# Patient Record
Sex: Male | Born: 1990 | Race: White | Hispanic: No | Smoking: Current every day smoker
Health system: Southern US, Community
[De-identification: ages and names within clinical notes are randomized; demographics above are authoritative.]

## PROBLEM LIST (undated history)

## (undated) ENCOUNTER — Emergency Department: Admission: EM | Discharge status: Absent without leave | Payer: Self-pay

## (undated) DIAGNOSIS — N289 Disorder of kidney and ureter, unspecified: Secondary | ICD-10-CM

## (undated) DIAGNOSIS — F329 Major depressive disorder, single episode, unspecified: Secondary | ICD-10-CM

## (undated) DIAGNOSIS — F32A Depression, unspecified: Secondary | ICD-10-CM

## (undated) DIAGNOSIS — F419 Anxiety disorder, unspecified: Secondary | ICD-10-CM

## (undated) DIAGNOSIS — F319 Bipolar disorder, unspecified: Secondary | ICD-10-CM

## (undated) DIAGNOSIS — K5090 Crohn's disease, unspecified, without complications: Secondary | ICD-10-CM

## (undated) HISTORY — PX: CHOLECYSTECTOMY: SHX55

---
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
## 2009-01-12 ENCOUNTER — Emergency Department: Payer: Self-pay | Admitting: Emergency Medicine

## 2009-04-11 ENCOUNTER — Emergency Department: Payer: Self-pay | Admitting: Emergency Medicine

## 2009-10-31 ENCOUNTER — Emergency Department: Payer: Self-pay | Admitting: Emergency Medicine

## 2010-02-23 ENCOUNTER — Emergency Department: Payer: Self-pay | Admitting: Emergency Medicine

## 2010-04-09 ENCOUNTER — Emergency Department: Payer: Self-pay | Admitting: Emergency Medicine

## 2012-08-17 ENCOUNTER — Emergency Department: Payer: Self-pay | Admitting: Internal Medicine

## 2013-10-14 ENCOUNTER — Emergency Department: Payer: Self-pay | Admitting: Emergency Medicine

## 2013-10-14 LAB — CBC
HCT: 52.8 % — ABNORMAL HIGH (ref 40.0–52.0)
HGB: 18.5 g/dL — AB (ref 13.0–18.0)
MCH: 32.5 pg (ref 26.0–34.0)
MCHC: 35.1 g/dL (ref 32.0–36.0)
MCV: 93 fL (ref 80–100)
Platelet: 214 10*3/uL (ref 150–440)
RBC: 5.7 10*6/uL (ref 4.40–5.90)
RDW: 13.1 % (ref 11.5–14.5)
WBC: 9 10*3/uL (ref 3.8–10.6)

## 2013-10-14 LAB — PROTIME-INR
INR: 0.9
Prothrombin Time: 12 secs (ref 11.5–14.7)

## 2013-10-14 LAB — COMPREHENSIVE METABOLIC PANEL
ALK PHOS: 116 U/L
AST: 16 U/L (ref 15–37)
Albumin: 4.5 g/dL (ref 3.4–5.0)
Anion Gap: 2 — ABNORMAL LOW (ref 7–16)
BUN: 11 mg/dL (ref 7–18)
Bilirubin,Total: 0.4 mg/dL (ref 0.2–1.0)
CHLORIDE: 105 mmol/L (ref 98–107)
CREATININE: 1.03 mg/dL (ref 0.60–1.30)
Calcium, Total: 9.7 mg/dL (ref 8.5–10.1)
Co2: 31 mmol/L (ref 21–32)
EGFR (African American): >60
Glucose: 72 mg/dL (ref 65–99)
Osmolality: 274 (ref 275–301)
Potassium: 3.9 mmol/L (ref 3.5–5.1)
SGPT (ALT): 34 U/L (ref 12–78)
Sodium: 138 mmol/L (ref 136–145)
Total Protein: 8.4 g/dL — ABNORMAL HIGH (ref 6.4–8.2)

## 2013-10-14 LAB — URINALYSIS, COMPLETE
BLOOD: NEGATIVE
Bilirubin,UR: NEGATIVE
GLUCOSE, UR: NEGATIVE mg/dL (ref 0–75)
KETONE: NEGATIVE
Leukocyte Esterase: NEGATIVE
NITRITE: NEGATIVE
PH: 8 (ref 4.5–8.0)
PROTEIN: NEGATIVE
SPECIFIC GRAVITY: 1.019 (ref 1.003–1.030)
SQUAMOUS EPITHELIAL: NONE SEEN
WBC UR: NONE SEEN /HPF (ref 0–5)

## 2013-10-14 LAB — LIPASE, BLOOD: LIPASE: 70 U/L — AB (ref 73–393)

## 2013-11-09 ENCOUNTER — Emergency Department: Payer: Self-pay | Admitting: Emergency Medicine

## 2013-11-09 LAB — COMPREHENSIVE METABOLIC PANEL
ALK PHOS: 103 U/L
ANION GAP: 6 — AB (ref 7–16)
Albumin: 3.9 g/dL (ref 3.4–5.0)
BUN: 13 mg/dL (ref 7–18)
Bilirubin,Total: 0.5 mg/dL (ref 0.2–1.0)
CALCIUM: 9 mg/dL (ref 8.5–10.1)
CHLORIDE: 105 mmol/L (ref 98–107)
Co2: 29 mmol/L (ref 21–32)
Creatinine: 1 mg/dL (ref 0.60–1.30)
EGFR (African American): >60
Glucose: 73 mg/dL (ref 65–99)
Osmolality: 278 (ref 275–301)
POTASSIUM: 3.7 mmol/L (ref 3.5–5.1)
SGOT(AST): 19 U/L (ref 15–37)
SGPT (ALT): 42 U/L (ref 12–78)
Sodium: 140 mmol/L (ref 136–145)
TOTAL PROTEIN: 7.4 g/dL (ref 6.4–8.2)

## 2013-11-09 LAB — CBC WITH DIFFERENTIAL/PLATELET
Basophil #: 0 10*3/uL (ref 0.0–0.1)
Basophil %: 0.5 %
EOS PCT: 0.9 %
Eosinophil #: 0.1 10*3/uL (ref 0.0–0.7)
HCT: 50.2 % (ref 40.0–52.0)
HGB: 16.9 g/dL (ref 13.0–18.0)
Lymphocyte #: 2.9 10*3/uL (ref 1.0–3.6)
Lymphocyte %: 30.2 %
MCH: 31.1 pg (ref 26.0–34.0)
MCHC: 33.7 g/dL (ref 32.0–36.0)
MCV: 93 fL (ref 80–100)
MONO ABS: 0.8 x10 3/mm (ref 0.2–1.0)
MONOS PCT: 8.1 %
NEUTROS ABS: 5.8 10*3/uL (ref 1.4–6.5)
Neutrophil %: 60.3 %
Platelet: 200 10*3/uL (ref 150–440)
RBC: 5.43 10*6/uL (ref 4.40–5.90)
RDW: 13.5 % (ref 11.5–14.5)
WBC: 9.7 10*3/uL (ref 3.8–10.6)

## 2013-12-07 ENCOUNTER — Ambulatory Visit: Payer: Self-pay | Admitting: Gastroenterology

## 2013-12-09 LAB — PATHOLOGY REPORT

## 2014-11-22 ENCOUNTER — Emergency Department
Admit: 2014-11-22 | Disposition: A | Discharge status: Home / Self Care | Payer: Self-pay | Admitting: Emergency Medicine

## 2016-04-03 ENCOUNTER — Encounter: Payer: Self-pay | Admitting: Emergency Medicine

## 2016-04-03 ENCOUNTER — Observation Stay
Admission: EM | Admit: 2016-04-03 | Discharge: 2016-04-06 | Disposition: A | Discharge status: Home / Self Care | Payer: Self-pay | Attending: Internal Medicine | Admitting: Internal Medicine

## 2016-04-03 DIAGNOSIS — N390 Urinary tract infection, site not specified: Secondary | ICD-10-CM | POA: Insufficient documentation

## 2016-04-03 DIAGNOSIS — E860 Dehydration: Secondary | ICD-10-CM | POA: Insufficient documentation

## 2016-04-03 DIAGNOSIS — F17210 Nicotine dependence, cigarettes, uncomplicated: Secondary | ICD-10-CM | POA: Insufficient documentation

## 2016-04-03 DIAGNOSIS — F17200 Nicotine dependence, unspecified, uncomplicated: Secondary | ICD-10-CM

## 2016-04-03 DIAGNOSIS — I10 Essential (primary) hypertension: Secondary | ICD-10-CM | POA: Insufficient documentation

## 2016-04-03 DIAGNOSIS — D72829 Elevated white blood cell count, unspecified: Secondary | ICD-10-CM | POA: Insufficient documentation

## 2016-04-03 DIAGNOSIS — N3289 Other specified disorders of bladder: Secondary | ICD-10-CM | POA: Insufficient documentation

## 2016-04-03 DIAGNOSIS — R319 Hematuria, unspecified: Secondary | ICD-10-CM | POA: Insufficient documentation

## 2016-04-03 DIAGNOSIS — E8770 Fluid overload, unspecified: Secondary | ICD-10-CM | POA: Insufficient documentation

## 2016-04-03 DIAGNOSIS — Q7649 Other congenital malformations of spine, not associated with scoliosis: Secondary | ICD-10-CM | POA: Insufficient documentation

## 2016-04-03 DIAGNOSIS — N179 Acute kidney failure, unspecified: Principal | ICD-10-CM | POA: Insufficient documentation

## 2016-04-03 DIAGNOSIS — E876 Hypokalemia: Secondary | ICD-10-CM | POA: Insufficient documentation

## 2016-04-03 DIAGNOSIS — K5090 Crohn's disease, unspecified, without complications: Secondary | ICD-10-CM | POA: Insufficient documentation

## 2016-04-03 HISTORY — DX: Crohn's disease, unspecified, without complications: K50.90

## 2016-04-03 LAB — BASIC METABOLIC PANEL
Anion gap: 15 (ref 5–15)
BUN: 21 mg/dL — ABNORMAL HIGH (ref 6–20)
CO2: 24 mmol/L (ref 22–32)
Calcium: 12.3 mg/dL — ABNORMAL HIGH (ref 8.9–10.3)
Chloride: 97 mmol/L — ABNORMAL LOW (ref 101–111)
Creatinine, Ser: 2.79 mg/dL — ABNORMAL HIGH (ref 0.61–1.24)
GFR calc non Af Amer: 30 mL/min — ABNORMAL LOW (ref >60)
GFR, EST AFRICAN AMERICAN: 34 mL/min — AB (ref >60)
Glucose, Bld: 100 mg/dL — ABNORMAL HIGH (ref 65–99)
POTASSIUM: 4 mmol/L (ref 3.5–5.1)
SODIUM: 136 mmol/L (ref 135–145)

## 2016-04-03 LAB — HEPATIC FUNCTION PANEL
ALBUMIN: 5.8 g/dL — AB (ref 3.5–5.0)
ALK PHOS: 105 U/L (ref 38–126)
ALT: 32 U/L (ref 17–63)
AST: 31 U/L (ref 15–41)
Bilirubin, Direct: <0.1 mg/dL — ABNORMAL LOW (ref 0.1–0.5)
Total Bilirubin: 0.9 mg/dL (ref 0.3–1.2)
Total Protein: 9.9 g/dL — ABNORMAL HIGH (ref 6.5–8.1)

## 2016-04-03 LAB — CBC
HEMATOCRIT: 53.1 % — AB (ref 40.0–52.0)
Hemoglobin: 18.6 g/dL — ABNORMAL HIGH (ref 13.0–18.0)
MCH: 31.7 pg (ref 26.0–34.0)
MCHC: 35 g/dL (ref 32.0–36.0)
MCV: 90.4 fL (ref 80.0–100.0)
Platelets: 312 10*3/uL (ref 150–440)
RBC: 5.87 MIL/uL (ref 4.40–5.90)
RDW: 13.3 % (ref 11.5–14.5)
WBC: 23.7 10*3/uL — AB (ref 3.8–10.6)

## 2016-04-03 LAB — CK: Total CK: 184 U/L (ref 49–397)

## 2016-04-03 MED ORDER — SODIUM CHLORIDE 0.9 % IV BOLUS (SEPSIS)
1000.0000 mL | Freq: Once | INTRAVENOUS | Status: AC
  Administered 2016-04-03: 1000 mL via INTRAVENOUS

## 2016-04-03 MED ORDER — NICOTINE POLACRILEX 2 MG MT GUM: 2.0000 mg | CHEWING_GUM | OROMUCOSAL | Status: DC | PRN

## 2016-04-03 MED ORDER — ACETAMINOPHEN 500 MG PO TABS
1000.0000 mg | ORAL_TABLET | Freq: Once | ORAL | Status: AC
  Administered 2016-04-03: 1000 mg via ORAL
  Filled 2016-04-03: qty 2

## 2016-04-03 MED ORDER — NICOTINE 21 MG/24HR TD PT24
21.0000 mg | MEDICATED_PATCH | Freq: Every day | TRANSDERMAL | Status: DC
  Administered 2016-04-04 – 2016-04-06 (×3): 21 mg via TRANSDERMAL
  Filled 2016-04-03 (×3): qty 1

## 2016-04-03 MED ORDER — SODIUM CHLORIDE 0.9 % IV BOLUS (SEPSIS)
1000.0000 mL | Freq: Once | INTRAVENOUS | Status: AC
Start: 2016-04-03 — End: 2016-04-04
  Administered 2016-04-03: 1000 mL via INTRAVENOUS

## 2016-04-03 NOTE — ED Provider Notes (Signed)
[facility] Emergency Department Provider Note   ____________________________________________   I have reviewed the triage vital signs and the nursing notes.   HISTORY  Chief Complaint Loss of Consciousness; Spasms; and Altered Mental Status   History limited by: Not Limited   HPI Carl Martinez is a 25 y.o. male who presents to the emergency department today after syncopal episode. 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 He then fell. He did not fall. Coworkers helped him off and then when he was down on the ground. He denies any history. He denied any chest pain during this episode. He has had some associated muscle cramps and spasms. The patient states that he thought he was staying well-hydrated today. He however cannot recall urinating except after waking up.   Past Medical History:  Diagnosis Date  . Crohn disease ([place])     There are no active problems to display for this patient.   Past Surgical History:  Procedure Laterality Date  . CHOLECYSTECTOMY      Prior to Admission medications   Not on File    Allergies Review of patient's allergies indicates no known allergies.  No family history on file.  Social History Social History  Substance Use Topics  . Smoking status: Current Every Day Smoker    Packs/day: 1.00    Types: Cigarettes  . Smokeless tobacco: Never Used  . Alcohol use No    Review of Systems  Constitutional: Negative for fever. Cardiovascular: Negative for chest pain. Respiratory: Negative for shortness of breath. Gastrointestinal: Negative for abdominal pain, vomiting and diarrhea. Genitourinary: Negative for dysuria. Decreased urinary frequency. Musculoskeletal: Positive for body cramps and aches Skin: Negative for rash. Neurological: Negative for headaches, focal weakness or numbness.  10-point ROS otherwise  negative.  ____________________________________________   PHYSICAL EXAM:  VITAL SIGNS: ED Triage Vitals  Enc Vitals Group     BP 04/03/16 1939 113/74     Pulse Rate 04/03/16 1939 (!) 104     Resp 04/03/16 1939 20     Temp 04/03/16 1939 98.2 F (36.8 C)     Temp Source 04/03/16 1939 Oral     SpO2 04/03/16 1939 98 %     Weight 04/03/16 1939 240 lb (108.9 kg)     Height 04/03/16 1939 6' 1"  (1.854 m)     Head Circumference --      Peak Flow --      Pain Score 04/03/16 1940 10   Constitutional: Alert and oriented. Appears slightly uncomfortable.  Eyes: Conjunctivae are normal. Normal extraocular movements. ENT   Head: Normocephalic and atraumatic.   Nose: No congestion/rhinnorhea.   Mouth/Throat: Mucous membranes are moist.   Neck: No stridor. Hematological/Lymphatic/Immunilogical: No cervical lymphadenopathy. Cardiovascular: Tachycardic. regular rhythm.  No murmurs, rubs, or gallops. Respiratory: Normal respiratory effort without tachypnea nor retractions. Breath sounds are clear and equal bilaterally. No wheezes/rales/rhonchi. Gastrointestinal: Soft and nontender. No distention.  Genitourinary: Deferred Musculoskeletal: Normal range of motion in all extremities. No lower extremity edema. Neurologic:  Normal speech and language. No gross focal neurologic deficits are appreciated.  Skin:  Skin is warm, dry and intact. Question sunburn on face. Psychiatric: Mood and affect are normal. Speech and behavior are normal. Patient exhibits appropriate insight and judgment.  ____________________________________________    LABS (pertinent positives/negatives)  Labs Reviewed  BASIC METABOLIC PANEL - Abnormal; Notable for the following:       Result Value  Chloride 97 (*)    Glucose, Bld 100 (*)    BUN 21 (*)    Creatinine, Ser 2.79 (*)    Calcium 12.3 (*)    GFR calc non Af Amer 30 (*)    GFR calc Af Amer 34 (*)    All other components within normal limits  CBC -  Abnormal; Notable for the following:    WBC 23.7 (*)    Hemoglobin 18.6 (*)    HCT 53.1 (*)    All other components within normal limits  HEPATIC FUNCTION PANEL - Abnormal; Notable for the following:    Total Protein 9.9 (*)    Albumin 5.8 (*)    Bilirubin, Direct <0.1 (*)    All other components within normal limits  CK  URINALYSIS COMPLETEWITH MICROSCOPIC ([facility] ONLY)  LACTIC ACID, PLASMA  LACTIC ACID, PLASMA  C-REACTIVE PROTEIN  CBG MONITORING, ED    ____________________________________________   EKG  I, Nance Pear, attending physician, personally viewed and interpreted this EKG  EKG Time: 1944 Rate: 102 Rhythm: sinus tachycardia Axis: normal Intervals: qtc 424 QRS: narrow ST changes: no st elevation Impression: abnormal ekg   ____________________________________________    RADIOLOGY  None  ___________________________________________   PROCEDURES  Procedures  ____________________________________________   INITIAL IMPRESSION / ASSESSMENT AND PLAN / ED COURSE  Pertinent labs & imaging results that were available during my care of the patient were reviewed by me and considered in my medical decision making (see chart for details).  Patient presented to the emergency department today after 2 syncopal episodes while at work. Patient does work as a Theme park manager. Blood work is concerning for significant dehydration with elevation of his hemoglobin, white blood cells, calcium and creatinine. Will give patient IV fluids here. Will plan on admission to the hospital service. ____________________________________________   FINAL CLINICAL IMPRESSION(S) / ED DIAGNOSES  Final diagnoses:  AKI (acute kidney injury) ([place])  Dehydration  Hypercalcemia     Note: This dictation was prepared with Dragon dictation. Any transcriptional errors that result from this process are unintentional    Nance Pear, MD 04/03/16 2348

## 2016-04-03 NOTE — ED Notes (Signed)
Charge nurse notified of pt's labs

## 2016-04-03 NOTE — ED Triage Notes (Signed)
Pt presents to ED post syncope X2 and severe muscle cramping this afternoon. 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 Pt states he did not fall off roof. Pt was assisted to the ground by co-workers and sat down on the ground just prior to his second syncopal episode. unsure if he hit his head on the concrete. Continued dizziness and generalized muscle cramping. Pt states he "feels slow" and "foggy". Pt states when he has a muscle spasm he starts to sweat and feels like he is going to pass out again. Face red and frequent muscle spasms noted during triage. Restless. Able to answer questions.

## 2016-04-04 ENCOUNTER — Encounter: Payer: Self-pay | Admitting: *Deleted

## 2016-04-04 ENCOUNTER — Observation Stay: Payer: Self-pay

## 2016-04-04 LAB — URINALYSIS COMPLETE WITH MICROSCOPIC (ARMC ONLY)
BILIRUBIN URINE: NEGATIVE
GLUCOSE, UA: NEGATIVE mg/dL
HGB URINE DIPSTICK: NEGATIVE
LEUKOCYTES UA: NEGATIVE
Nitrite: NEGATIVE
Protein, ur: 30 mg/dL — AB
SPECIFIC GRAVITY, URINE: 1.027 (ref 1.005–1.030)
pH: 5 (ref 5.0–8.0)

## 2016-04-04 LAB — CBC
HEMATOCRIT: 43.6 % (ref 40.0–52.0)
Hemoglobin: 15.5 g/dL (ref 13.0–18.0)
MCH: 32.2 pg (ref 26.0–34.0)
MCHC: 35.5 g/dL (ref 32.0–36.0)
MCV: 90.6 fL (ref 80.0–100.0)
Platelets: 229 10*3/uL (ref 150–440)
RBC: 4.82 MIL/uL (ref 4.40–5.90)
RDW: 13.5 % (ref 11.5–14.5)
WBC: 15 10*3/uL — ABNORMAL HIGH (ref 3.8–10.6)

## 2016-04-04 LAB — COMPREHENSIVE METABOLIC PANEL
ALBUMIN: 4 g/dL (ref 3.5–5.0)
ALT: 21 U/L (ref 17–63)
ANION GAP: 7 (ref 5–15)
AST: 18 U/L (ref 15–41)
Alkaline Phosphatase: 78 U/L (ref 38–126)
BILIRUBIN TOTAL: 0.8 mg/dL (ref 0.3–1.2)
BUN: 20 mg/dL (ref 6–20)
CO2: 24 mmol/L (ref 22–32)
Calcium: 8.9 mg/dL (ref 8.9–10.3)
Chloride: 104 mmol/L (ref 101–111)
Creatinine, Ser: 1.7 mg/dL — ABNORMAL HIGH (ref 0.61–1.24)
GFR calc Af Amer: >60 mL/min (ref >60)
GFR calc non Af Amer: 54 mL/min — ABNORMAL LOW (ref >60)
GLUCOSE: 97 mg/dL (ref 65–99)
POTASSIUM: 3.4 mmol/L — AB (ref 3.5–5.1)
SODIUM: 135 mmol/L (ref 135–145)
TOTAL PROTEIN: 7.1 g/dL (ref 6.5–8.1)

## 2016-04-04 LAB — LACTIC ACID, PLASMA
LACTIC ACID, VENOUS: 1.1 mmol/L (ref 0.5–1.9)
Lactic Acid, Venous: 1.2 mmol/L (ref 0.5–1.9)

## 2016-04-04 LAB — MAGNESIUM: Magnesium: 2 mg/dL (ref 1.7–2.4)

## 2016-04-04 LAB — C-REACTIVE PROTEIN: CRP: 1.4 mg/dL — AB (ref <1.0)

## 2016-04-04 LAB — PHOSPHORUS: Phosphorus: 3.8 mg/dL (ref 2.5–4.6)

## 2016-04-04 MED ORDER — POTASSIUM CHLORIDE CRYS ER 20 MEQ PO TBCR
40.0000 meq | EXTENDED_RELEASE_TABLET | Freq: Once | ORAL | Status: AC
  Administered 2016-04-04: 40 meq via ORAL
  Filled 2016-04-04: qty 2

## 2016-04-04 MED ORDER — MAGNESIUM CITRATE PO SOLN
1.0000 | Freq: Once | ORAL | Status: DC | PRN
  Filled 2016-04-04: qty 296

## 2016-04-04 MED ORDER — DEXTROSE 5 % IV SOLN
1.0000 g | INTRAVENOUS | Status: DC
  Administered 2016-04-04 – 2016-04-05 (×2): 1 g via INTRAVENOUS
  Filled 2016-04-04 (×2): qty 10

## 2016-04-04 MED ORDER — ONDANSETRON HCL 4 MG PO TABS: 4.0000 mg | ORAL_TABLET | Freq: Four times a day (QID) | ORAL | Status: DC | PRN

## 2016-04-04 MED ORDER — SENNOSIDES-DOCUSATE SODIUM 8.6-50 MG PO TABS: 1.0000 | ORAL_TABLET | Freq: Every evening | ORAL | Status: DC | PRN

## 2016-04-04 MED ORDER — HYDROCODONE-ACETAMINOPHEN 5-325 MG PO TABS
1.0000 | ORAL_TABLET | ORAL | Status: DC | PRN
  Administered 2016-04-04 – 2016-04-05 (×8): 2 via ORAL
  Filled 2016-04-04 (×8): qty 2

## 2016-04-04 MED ORDER — ACETAMINOPHEN 650 MG RE SUPP: 650.0000 mg | Freq: Four times a day (QID) | RECTAL | Status: DC | PRN

## 2016-04-04 MED ORDER — ZOLPIDEM TARTRATE 5 MG PO TABS: 5.0000 mg | ORAL_TABLET | Freq: Every evening | ORAL | Status: DC | PRN

## 2016-04-04 MED ORDER — NICOTINE 21 MG/24HR TD PT24
MEDICATED_PATCH | TRANSDERMAL | Status: AC
  Administered 2016-04-04: 21 mg via TRANSDERMAL
  Filled 2016-04-04: qty 1

## 2016-04-04 MED ORDER — TRAMADOL HCL 50 MG PO TABS: 50.0000 mg | ORAL_TABLET | Freq: Four times a day (QID) | ORAL | Status: DC | PRN

## 2016-04-04 MED ORDER — ACETAMINOPHEN 325 MG PO TABS: 650.0000 mg | ORAL_TABLET | Freq: Four times a day (QID) | ORAL | Status: DC | PRN

## 2016-04-04 MED ORDER — SODIUM CHLORIDE 0.9 % IV SOLN
INTRAVENOUS | Status: DC
  Administered 2016-04-04 (×5): via INTRAVENOUS

## 2016-04-04 MED ORDER — BISACODYL 5 MG PO TBEC: 5.0000 mg | DELAYED_RELEASE_TABLET | Freq: Every day | ORAL | Status: DC | PRN

## 2016-04-04 MED ORDER — SODIUM CHLORIDE 0.9% FLUSH
3.0000 mL | Freq: Two times a day (BID) | INTRAVENOUS | Status: DC
  Administered 2016-04-05 – 2016-04-06 (×3): 3 mL via INTRAVENOUS

## 2016-04-04 MED ORDER — ONDANSETRON HCL 4 MG/2ML IJ SOLN
4.0000 mg | Freq: Four times a day (QID) | INTRAMUSCULAR | Status: DC | PRN
Start: 2016-04-04 — End: 2016-04-06
  Administered 2016-04-04 – 2016-04-05 (×4): 4 mg via INTRAVENOUS
  Filled 2016-04-04 (×4): qty 2

## 2016-04-04 NOTE — H&P (Signed)
[place] @ [facility] Admission History and Physical [employer], D.O.  ---------------------------------------------------------------------------------------------------------------------   PATIENT NAME: Carl Martinez MR#: [id number] DATE OF BIRTH: 04-23-1991 DATE OF ADMISSION: 04/03/2016 PRIMARY CARE PHYSICIAN: No primary care provider on file.  REQUESTING/REFERRING PHYSICIAN: ED Dr. Archie Balboa  CHIEF COMPLAINT: Chief Complaint  Patient presents with  . Loss of Consciousness  . Spasms  . Altered Mental Status    HISTORY OF PRESENT ILLNESS: Carl Martinez is a 25 y.o. male with a known history of Crohn's disease was in a usual state of health until this afternoon when he was at work and sustained a syncopal episode. Patient states that his morning was unremarkable until about noon when he experienced dizziness, lightheadedness, muscle cramps and spasm. He lost consciousness and fell to the ground. He regained consciousness spontaneously and was transported to the emergency department. The only other notable symptoms he can relay is that he has not had to urinate since this morning.  He denies any other recent changes in his status. There has been no recent change in medication or diet.  There has been no recent illness, travel or sick contacts.    Patient denies fevers/chills, weakness, dizziness, chest pain, shortness of breath, N/V/C/D, abdominal pain, dysuria/frequency, changes in mental status.   EMS/ED COURSE:   Patient received IV normal saline  PAST MEDICAL HISTORY: Past Medical History:  Diagnosis Date  . Crohn disease ([place])       PAST SURGICAL HISTORY: Past Surgical History:  Procedure Laterality Date  . CHOLECYSTECTOMY        SOCIAL HISTORY: Social History  Substance Use Topics  . Smoking status: Current Every Day Smoker    Packs/day: 1.00    Types: Cigarettes  . Smokeless tobacco: Never Used  . Alcohol use No   Patient denies  illicit drug use   FAMILY HISTORY: History reviewed. No pertinent family history.   MEDICATIONS AT HOME: Prior to Admission medications   Not on File      DRUG ALLERGIES: No Known Allergies   REVIEW OF SYSTEMS: CONSTITUTIONAL: No fever/chills, fatigue, weakness, weight gain/loss, headache EYES: No blurry or double vision. ENT: No tinnitus, postnasal drip, redness or soreness of the oropharynx. RESPIRATORY: No cough, wheeze, hemoptysis, dyspnea. CARDIOVASCULAR: No chest pain, orthopnea, palpitations. GASTROINTESTINAL: No nausea, vomiting, constipation, diarrhea, abdominal pain, hematemesis, melena or hematochezia. GENITOURINARY: No dysuria or hematuria. Positive decreased urine output ENDOCRINE: No polyuria or nocturia. No heat or cold intolerance. HEMATOLOGY: No anemia, bruising, bleeding. INTEGUMENTARY: No rashes, ulcers, lesions. MUSCULOSKELETAL: No arthritis, swelling, gout. Positive muscle cramps NEUROLOGIC: No numbness, tingling, weakness or ataxia. No seizure-type activity. Positive syncope PSYCHIATRIC: No anxiety, depression, insomnia.  PHYSICAL EXAMINATION: VITAL SIGNS: Blood pressure (!) 141/80, pulse 97, temperature 97.8 F (36.6 C), temperature source Oral, resp. rate 18, height 6' 1"  (1.854 m), weight 110.7 kg (244 lb), SpO2 100 %.  GENERAL: 25 y.o.-year-old white male patient, well-developed, well-nourished lying in the bed in no acute distress.  Pleasant and cooperative. HEENT: Head atraumatic, normocephalic. Pupils equal, round, reactive to light and accommodation. No scleral icterus. Extraocular muscles intact. Nares are patent. Oropharynx is clear. Mucus membranes moist. NECK: Supple, full range of motion. No JVD, no bruit heard. No thyroid enlargement, no tenderness, no cervical lymphadenopathy. CHEST: Normal breath sounds bilaterally. No wheezing, rales, rhonchi or crackles. No use of accessory muscles of respiration.  No reproducible chest wall tenderness.   CARDIOVASCULAR: S1, S2 normal. No murmurs, rubs, or gallops. Cap refill <2 seconds. ABDOMEN:  Soft, nontender, nondistended. No rebound, guarding, rigidity. Normoactive bowel sounds present in all four quadrants. No organomegaly or mass. EXTREMITIES: Full range of motion. No pedal edema, cyanosis, or clubbing. NEUROLOGIC: Cranial nerves II through XII are grossly intact with no focal sensorimotor deficit. Muscle strength 5/5 in all extremities. Sensation intact. Gait not checked. PSYCHIATRIC: The patient is alert and oriented x 3. Normal affect, mood, thought content. SKIN: Warm, dry, and intact without obvious rash, lesion, or ulcer. Patient with sunburned skin.  LABORATORY PANEL:  CBC  Recent Labs Lab 04/03/16 1955  WBC 23.7*  HGB 18.6*  HCT 53.1*  PLT 312   ----------------------------------------------------------------------------------------------------------------- Chemistries  Recent Labs Lab 04/03/16 1955  NA 136  K 4.0  CL 97*  CO2 24  GLUCOSE 100*  BUN 21*  CREATININE 2.79*  CALCIUM 12.3*  AST 31  ALT 32  ALKPHOS 105  BILITOT 0.9   ------------------------------------------------------------------------------------------------------------------ Cardiac Enzymes No results for input(s): TROPONINI in the last 168 hours. ------------------------------------------------------------------------------------------------------------------  RADIOLOGY: No results found.  EKG: Sinus tachycardia at 102 bpm with normal axis and nonspecific ST and T wave changes.  IMPRESSION AND PLAN:  This is a 25 y.o. male with a history of Crohn's disease now being admitted with: 1. AKI possibly secondary to dehydration. Will admit for IV fluid resuscitation and monitoring of electrolytes. We'll also check CPK 2. Syncope likely secondary to dehydration. We will monitor the patient on telemetry 3. Leukocytosis-possibly secondary to hemoconcentration. Patient has no signs of  infection with the exception of some mild nasal congestion. We'll monitor CBC in the a.m. following IV fluid resuscitation 4. Hypercalcemia-we'll rehydrate and recheck calcium level in the morning.   Diet/Nutrition: Regular Fluids: IV normal saline DVT Px: SCDs and early ambulation Code Status: Full  All the records are reviewed and case discussed with ED provider. Management plans discussed with the patient and/or family who express understanding and agree with plan of care.   TOTAL TIME TAKING CARE OF THIS PATIENT:50 minutes.   Aevah Stansbery D.O. on 04/04/2016 at 1:27 AM Between 7am to 6pm - Pager - [phone] After 6pm go to [email or web address] - [employer] [place] [facility] Office [phone] CC: Primary care physician; No primary care provider on file.     Note: This dictation was prepared with Dragon dictation along with smaller phrase technology. Any transcriptional errors that result from this process are unintentional.

## 2016-04-04 NOTE — Progress Notes (Signed)
[place] at [place] NAME: Carl Martinez    MR#:  [id number]  DATE OF BIRTH:  [birth year]  SUBJECTIVE:  CHIEF COMPLAINT:   Chief Complaint  Patient presents with  . Loss of Consciousness  . Spasms  . Altered Mental Status   The patient is 25 year old Caucasian male with past medical history significant for history of Crohn's disease, who presents to the hospital with syncopal episode, lightheadedness, dizziness, muscle cramps and spasms. On arrival to the hospital. He was noted to be in acute renal failure. He complains of dizziness, lightheadedness, right flank pain, some dysuria. Denies any recent diarrhea, last bowel movement was on Monday, 2 days ago, diarrheal stool. Complains of difficulty urinating, inability to void. Review of Systems  Constitutional: Positive for malaise/fatigue. Negative for chills, fever and weight loss.  HENT: Negative for congestion.   Eyes: Negative for blurred vision and double vision.  Respiratory: Negative for cough, sputum production, shortness of breath and wheezing.   Cardiovascular: Negative for chest pain, palpitations, orthopnea, leg swelling and PND.  Gastrointestinal: Positive for abdominal pain and diarrhea. Negative for blood in stool, constipation, nausea and vomiting.  Genitourinary: Positive for flank pain. Negative for dysuria, frequency, hematuria and urgency.  Musculoskeletal: Negative for falls.  Neurological: Positive for weakness. Negative for dizziness, tremors, focal weakness and headaches.  Endo/Heme/Allergies: Does not bruise/bleed easily.  Psychiatric/Behavioral: Negative for depression. The patient does not have insomnia.     VITAL SIGNS: Blood pressure 125/65, pulse 81, temperature 97.5 F (36.4 C), temperature source Oral, resp. rate 15, height 6' 1"  (1.854 m), weight 110.7 kg (244 lb), SpO2 100 %.  PHYSICAL EXAMINATION:   GENERAL:  25 y.o.-year-old patient lying in the  bed flushed face, in mild distress, uncomfortable due to pain in the flank.  EYES: Pupils equal, round, reactive to light and accommodation. No scleral icterus. Extraocular muscles intact.  HEENT: Head atraumatic, normocephalic. Oropharynx and nasopharynx clear.  NECK:  Supple, no jugular venous distention. No thyroid enlargement, no tenderness.  LUNGS: Normal breath sounds bilaterally, no wheezing, rales,rhonchi or crepitation. No use of accessory muscles of respiration.  CARDIOVASCULAR: S1, S2 normal. No murmurs, rubs, or gallops.  ABDOMEN: Soft, tender in the right upper quadrant, right mid abdomen some suprapubic area, but no rebound or guarding, nondistended. Bowel sounds present. No organomegaly or mass. Right CVA tenderness on percussion EXTREMITIES: No pedal edema, cyanosis, or clubbing.  NEUROLOGIC: Cranial nerves II through XII are intact. Muscle strength 5/5 in all extremities. Sensation intact. Gait not checked.  PSYCHIATRIC: The patient is alert and oriented x 3.  SKIN: No obvious rash, lesion, or ulcer.   ORDERS/RESULTS REVIEWED:   CBC  Recent Labs Lab 04/03/16 1955 04/04/16 0244  WBC 23.7* 15.0*  HGB 18.6* 15.5  HCT 53.1* 43.6  PLT 312 229  MCV 90.4 90.6  MCH 31.7 32.2  MCHC 35.0 35.5  RDW 13.3 13.5   ------------------------------------------------------------------------------------------------------------------  Chemistries   Recent Labs Lab 04/03/16 1955 04/04/16 0244  NA 136 135  K 4.0 3.4*  CL 97* 104  CO2 24 24  GLUCOSE 100* 97  BUN 21* 20  CREATININE 2.79* 1.70*  CALCIUM 12.3* 8.9  MG  --  2.0  AST 31 18  ALT 32 21  ALKPHOS 105 78  BILITOT 0.9 0.8   ------------------------------------------------------------------------------------------------------------------ estimated creatinine clearance is 85.9 mL/min (by C-G formula based on SCr of 1.7  mg/dL). ------------------------------------------------------------------------------------------------------------------ No results for input(s): TSH, T4TOTAL, T3FREE, THYROIDAB  in the last 72 hours.  Invalid input(s): FREET3  Cardiac Enzymes No results for input(s): CKMB, TROPONINI, MYOGLOBIN in the last 168 hours.  Invalid input(s): CK ------------------------------------------------------------------------------------------------------------------ Invalid input(s): POCBNP ---------------------------------------------------------------------------------------------------------------  RADIOLOGY: No results found.  EKG:  Orders placed or performed during the hospital encounter of 04/03/16  . ED EKG  . ED EKG    ASSESSMENT AND PLAN:  Active Problems:   AKI (acute kidney injury) ([place])  #1. Acute renal failure, likely due to dehydration with diarrhea, infection/acute pyelonephritis, urinary retention, continue IV fluids, kidney function has been improving, get CT scan of abdomen and pelvis to rule out stones #2. Acute pyelonephritis, continue Rocephin after cultures were taken, follow culture results, supportive therapy as needed #3hypokalemia, supplement orally, get magnesium level checked, supplement as needed #4. Leukocytosis, follow with antibiotic therapy   Management plans discussed with the patient, family and they are in agreement.   DRUG ALLERGIES: No Known Allergies  CODE STATUS:     Code Status Orders        Start     Ordered   04/04/16 0045  Full code  Continuous     04/04/16 0044    Code Status History    Date Active Date Inactive Code Status Order ID Comments User Context   This patient has a current code status but no historical code status.      TOTAL TIME TAKING CARE OF THIS PATIENT: 40 minutes.    Theodoro Grist M.D on 04/04/2016 at 11:17 AM  Between 7am to 6pm - Pager - [phone]  After 6pm go to [email or web address] - password [email or web address]  [facility]  [place] [facility]  Office  [phone]  CC: Primary care physician; No primary care provider on file.

## 2016-04-05 DIAGNOSIS — F17200 Nicotine dependence, unspecified, uncomplicated: Secondary | ICD-10-CM

## 2016-04-05 LAB — URINE CULTURE

## 2016-04-05 LAB — BASIC METABOLIC PANEL
ANION GAP: 5 (ref 5–15)
BUN: 14 mg/dL (ref 6–20)
CHLORIDE: 110 mmol/L (ref 101–111)
CO2: 24 mmol/L (ref 22–32)
CREATININE: 1.01 mg/dL (ref 0.61–1.24)
Calcium: 9 mg/dL (ref 8.9–10.3)
GFR calc non Af Amer: >60 mL/min (ref >60)
Glucose, Bld: 105 mg/dL — ABNORMAL HIGH (ref 65–99)
Potassium: 4.1 mmol/L (ref 3.5–5.1)
Sodium: 139 mmol/L (ref 135–145)

## 2016-04-05 LAB — CBC
HCT: 44.2 % (ref 40.0–52.0)
HEMOGLOBIN: 15.3 g/dL (ref 13.0–18.0)
MCH: 32 pg (ref 26.0–34.0)
MCHC: 34.5 g/dL (ref 32.0–36.0)
MCV: 92.7 fL (ref 80.0–100.0)
Platelets: 216 10*3/uL (ref 150–440)
RBC: 4.77 MIL/uL (ref 4.40–5.90)
RDW: 13.5 % (ref 11.5–14.5)
WBC: 8.4 10*3/uL (ref 3.8–10.6)

## 2016-04-05 LAB — PARATHYROID HORMONE, INTACT (NO CA): PTH: 22 pg/mL (ref 15–65)

## 2016-04-05 MED ORDER — HYDROCODONE-ACETAMINOPHEN 5-325 MG PO TABS: 1.0000 | ORAL_TABLET | ORAL | 0 refills | Status: DC | PRN

## 2016-04-05 MED ORDER — ACETAMINOPHEN 325 MG PO TABS
650.0000 mg | ORAL_TABLET | ORAL | Status: DC | PRN
  Administered 2016-04-05: 650 mg via ORAL
  Filled 2016-04-05: qty 2

## 2016-04-05 MED ORDER — NICOTINE POLACRILEX 2 MG MT GUM: 2.0000 mg | CHEWING_GUM | OROMUCOSAL | 0 refills | Status: DC | PRN

## 2016-04-05 MED ORDER — NICOTINE 21 MG/24HR TD PT24: 21.0000 mg | MEDICATED_PATCH | Freq: Every day | TRANSDERMAL | 0 refills | Status: DC

## 2016-04-05 MED ORDER — DOCUSATE SODIUM 100 MG PO CAPS
100.0000 mg | ORAL_CAPSULE | Freq: Two times a day (BID) | ORAL | Status: DC
  Administered 2016-04-05 – 2016-04-06 (×3): 100 mg via ORAL
  Filled 2016-04-05 (×3): qty 1

## 2016-04-05 MED ORDER — FUROSEMIDE 10 MG/ML IJ SOLN
40.0000 mg | Freq: Once | INTRAMUSCULAR | Status: AC
  Administered 2016-04-05: 40 mg via INTRAVENOUS
  Filled 2016-04-05: qty 4

## 2016-04-05 MED ORDER — SENNA 8.6 MG PO TABS
1.0000 | ORAL_TABLET | Freq: Every day | ORAL | Status: DC
  Administered 2016-04-05 – 2016-04-06 (×2): 8.6 mg via ORAL
  Filled 2016-04-05 (×2): qty 1

## 2016-04-05 NOTE — Care Management (Signed)
Patient to discharge home today.  Admitted AKI possibly secondary to dehydration.  Patient to discharge with rx for vicodin, nicotine patch and gum.  Patient provided with coupon from goodrx.com for vicodin.  Patient provided with application for [facility] and Medication Management. RNCM signing off

## 2016-04-05 NOTE — Consult Note (Signed)
[facility] CONSULT NOTE    Date: 04/05/2016                  Patient Name:  Carl Martinez  MRN: [id number]  DOB: [birth year]  Age / Sex: 25 y.o., male         PCP: No primary care provider on file.                 Service Requesting Consult: Dr. Ether Griffins                 Reason for Consult: Acute renal failure            History of Present Illness: Patient is a 25 y.o. male with a PMHx of Crohn's disease, who was admitted to [facility] on 04/03/2016 for evaluation of syncope.  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  He was working on a roof in [place] we felt quite dizzy.  He was brought off of the roof and onto the ground.  As he was attempting to drink something the patient felt very dizzy and experienced a syncopal event.  Prior to the episode of syncope as above he has been feeling fatigued and dizzy.  He works out in the heat.  He stated that he was attempting to keep up with his by mouth fluid intake.  The patient has undergone IV fluid hydration and has had some improvement.  However he is still slightly dizzy.  He also has a bit of a headache.  He states that he has not urinated is much as he hoped after hydration.  Bladder scan did not show significant urinary retention.   Medications: Outpatient medications: No prescriptions prior to admission.    Current medications: Current Facility-Administered Medications  Medication Dose Route Frequency Provider Last Rate Last Dose  . acetaminophen (TYLENOL) tablet 650 mg  650 mg Oral Q6H PRN Alexis Hugelmeyer, DO       Or  . acetaminophen (TYLENOL) suppository 650 mg  650 mg Rectal Q6H PRN Alexis Hugelmeyer, DO      . bisacodyl (DULCOLAX) EC tablet 5 mg  5 mg Oral Daily PRN Alexis Hugelmeyer, DO      . docusate sodium (COLACE) capsule 100 mg  100 mg Oral BID Theodoro Grist, MD   100 mg at 04/05/16 0938  . HYDROcodone-acetaminophen (NORCO/VICODIN) 5-325 MG per tablet 1-2 tablet  1-2 tablet Oral Q4H PRN  Alexis Hugelmeyer, DO   2 tablet at 04/05/16 6503  . magnesium citrate solution 1 Bottle  1 Bottle Oral Once PRN Alexis Hugelmeyer, DO      . nicotine (NICODERM CQ - dosed in mg/24 hours) patch 21 mg  21 mg Transdermal Daily Alexis Hugelmeyer, DO   21 mg at 04/05/16 5465  . nicotine polacrilex (NICORETTE) gum 2 mg  2 mg Oral PRN Alexis Hugelmeyer, DO      . ondansetron (ZOFRAN) tablet 4 mg  4 mg Oral Q6H PRN Alexis Hugelmeyer, DO       Or  . ondansetron (ZOFRAN) injection 4 mg  4 mg Intravenous Q6H PRN Alexis Hugelmeyer, DO   4 mg at 04/05/16 0034  . senna (SENOKOT) tablet 8.6 mg  1 tablet Oral Daily Theodoro Grist, MD   8.6 mg at 04/05/16 0938  . sodium chloride flush (NS) 0.9 % injection 3 mL  3 mL Intravenous Q12H Alexis Hugelmeyer, DO   3 mL at 04/05/16 0938  . zolpidem (AMBIEN) tablet 5  mg  5 mg Oral QHS PRN,MR X 1 Alexis Hugelmeyer, DO          Allergies: No Known Allergies    Past Medical History: Past Medical History:  Diagnosis Date  . Crohn disease [facility])      Past Surgical History: Past Surgical History:  Procedure Laterality Date  . CHOLECYSTECTOMY       Family History: History reviewed. No pertinent family history.   Social History: Social History   Social History  . Marital status: Married    Spouse name: N/A  . Number of children: N/A  . Years of education: N/A   Occupational History  . Not on file.   Social History Main Topics  . Smoking status: Current Every Day Smoker    Packs/day: 1.00    Types: Cigarettes  . Smokeless tobacco: Never Used  . Alcohol use No  . Drug use: No  . Sexual activity: Not on file   Other Topics Concern  . Not on file   Social History Narrative  . No narrative on file     Review of Systems: Review of Systems  Constitutional: Positive for fever and malaise/fatigue. Negative for chills and weight loss.  HENT: Positive for ear pain, hearing loss and tinnitus.   Eyes: Negative for blurred vision and double vision.   Respiratory: Negative for cough and hemoptysis.   Cardiovascular: Negative for chest pain, palpitations and orthopnea.  Gastrointestinal: Negative for heartburn, nausea and vomiting.  Genitourinary: Negative for dysuria, frequency and urgency.  Musculoskeletal: Positive for myalgias.  Skin: Positive for itching and rash.  Neurological: Positive for dizziness, weakness and headaches. Negative for focal weakness.  Endo/Heme/Allergies: Negative for polydipsia. Does not bruise/bleed easily.  Psychiatric/Behavioral: Negative for depression and hallucinations.     Vital Signs: Blood pressure 122/69, pulse 71, temperature 98.1 F (36.7 C), temperature source Oral, resp. rate 17, height 6' 1"  (1.854 m), weight 110.7 kg (244 lb), SpO2 98 %.  Weight trends: Filed Weights   04/03/16 1939 04/04/16 0041  Weight: 108.9 kg (240 lb) 110.7 kg (244 lb)    Physical Exam: General: NAD, sitting up in bed  Head: Normocephalic, atraumatic.  Eyes: Anicteric, EOMI  Nose: Mucous membranes moist, not inflammed, nonerythematous.  Throat: Oropharynx nonerythematous, no exudate appreciated.   Neck: Supple, trachea midline.  Lungs:  Normal respiratory effort. Clear to auscultation BL without crackles or wheezes.  Heart: RRR. S1 and S2 normal without gallop, murmur, or rubs.  Abdomen:  BS normoactive. Soft, Nondistended, non-tender.  No masses or organomegaly.  Extremities: No pretibial edema.  Neurologic: A&O X3, Motor strength is 5/5 in the all 4 extremities  Skin: No visible rashes, scars.    Lab results: Basic Metabolic Panel:  Recent Labs Lab 04/03/16 1955 04/04/16 0244 04/05/16 0405  NA 136 135 139  K 4.0 3.4* 4.1  CL 97* 104 110  CO2 24 24 24   GLUCOSE 100* 97 105*  BUN 21* 20 14  CREATININE 2.79* 1.70* 1.01  CALCIUM 12.3* 8.9 9.0  MG  --  2.0  --   PHOS  --  3.8  --     Liver Function Tests:  Recent Labs Lab 04/03/16 1955 04/04/16 0244  AST 31 18  ALT 32 21  ALKPHOS 105 78   BILITOT 0.9 0.8  PROT 9.9* 7.1  ALBUMIN 5.8* 4.0   No results for input(s): LIPASE, AMYLASE in the last 168 hours. No results for input(s): AMMONIA in the last 168 hours.  CBC:  Recent Labs Lab 04/03/16 1955 04/04/16 0244 04/05/16 0405  WBC 23.7* 15.0* 8.4  HGB 18.6* 15.5 15.3  HCT 53.1* 43.6 44.2  MCV 90.4 90.6 92.7  PLT 312 229 216    Cardiac Enzymes:  Recent Labs Lab 04/03/16 1955  CKTOTAL 184    BNP: Invalid input(s): POCBNP  CBG: No results for input(s): GLUCAP in the last 168 hours.  Microbiology: Results for orders placed or performed during the hospital encounter of 04/03/16  Urine culture     Status: Abnormal   Collection Time: 04/04/16  5:30 AM  Result Value Ref Range Status   Specimen Description URINE, CLEAN CATCH  Final   Special Requests NONE  Final   Culture (A)  Final    <10,000 COLONIES/mL INSIGNIFICANT GROWTH Performed at [facility]    Report Status 04/05/2016 FINAL  Final    Coagulation Studies: No results for input(s): LABPROT, INR in the last 72 hours.  Urinalysis:  Recent Labs  04/04/16 0530  COLORURINE YELLOW*  LABSPEC 1.027  PHURINE 5.0  GLUCOSEU NEGATIVE  HGBUR NEGATIVE  BILIRUBINUR NEGATIVE  KETONESUR TRACE*  PROTEINUR 30*  NITRITE NEGATIVE  LEUKOCYTESUR NEGATIVE      Imaging: Ct Abdomen Pelvis Wo Contrast  Result Date: 04/04/2016 CLINICAL DATA:  Decreased urine output EXAM: CT ABDOMEN AND PELVIS WITHOUT CONTRAST TECHNIQUE: Multidetector CT imaging of the abdomen and pelvis was performed following the standard protocol without IV contrast. COMPARISON:  None. FINDINGS: Lower chest:  Within normal limits. Hepatobiliary: The gallbladder has been surgically removed. The liver is within normal limits. Pancreas: Pancreas is unremarkable. No focal mass or inflammatory changes are noted. Spleen: Within normal limits. Adrenals/Urinary Tract: The adrenal glands are unremarkable bilaterally. The kidneys demonstrate no  hydronephrosis or focal mass lesion. No renal calculi are seen. The bladder is partially distended. Stomach/Bowel: The appendix is within normal limits. No significant diverticular change is seen. No inflammatory changes are noted. Vascular/Lymphatic: No aneurysmal dilatation is noted. No significant lymphadenopathy is seen. Reproductive: Within normal limits. Musculoskeletal: No acute bony abnormality is noted. Partial sacralization of L5 is noted on the left. IMPRESSION: No acute abnormality noted. Electronically Signed   By: Inez Catalina M.D.   On: 04/04/2016 13:42      Assessment & Plan: Pt is a 25 y.o. male  with a PMHx of Crohn's disease, who was admitted to [facility] on 04/03/2016 for evaluation of syncope.  1.  Acute renal failure. 2.  Syncope. 3.  Dehydration/volume depletion.     Plan:  The patient works as a Theme park manager.  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  The patient did appear to have some prodrome with sustained dizziness.  He subsequently had a syncopal event when he came off of the roof.  He was found to be very hemoconcentrated upon admission and also had acute renal failure.  Renal function has improved significantly with IV fluid hydration. I encouraged the patient to maintain by mouth fluid intake at home.  Also advised him to avoid nephrotoxins including NSAIDs in the recovery period.  Creatinine has normalized today at 1.01.  He is clear for discharge.  Thanks for consultation.

## 2016-04-05 NOTE — Discharge Summary (Addendum)
[facility] [place] NAME: Carl Martinez    MR#:  [id number]  DATE OF BIRTH:  1990/08/11  DATE OF ADMISSION:  04/03/2016 ADMITTING PHYSICIAN: Ubaldo Glassing Hugelmeyer, DO  DATE OF DISCHARGE: No discharge date for patient encounter.  PRIMARY CARE PHYSICIAN: No primary care provider on file.     ADMISSION DIAGNOSIS:  Hypercalcemia [E83.52] Dehydration [E86.0] AKI (acute kidney injury) ([place]) [N17.9]  DISCHARGE DIAGNOSIS:  Active Problems:   AKI (acute kidney injury) ([place])   Dehydration   Sterile pyuria   Hematuria   Leukocytosis   Hypokalemia   Tobacco abuse   SECONDARY DIAGNOSIS:   Past Medical History:  Diagnosis Date  . Crohn disease ([place])     .pro HOSPITAL COURSE:  The patient is 25 year old Caucasian male with past medical history significant for history of Crohn's disease, who presents to the hospital with syncopal episode, lightheadedness, dizziness, muscle cramps and spasms. On arrival to the hospital. He was noted to be in acute renal failure. He complains of dizziness, lightheadedness, right flank pain, some dysuria. Last bowel movement was on Monday, 2 days ago PTA, diarrheal stool. The patient was given IV fluids and developed fluid overload, requiring Lasix administration. Complained of difficulty urinating, inability to void, however, bladder scan was minimal. There is a hydration. Patient's kidney function normalized. Urine cultures were taken. Due to concerns of urinary tract infection due to pyuria, hematuria, however, urinary cultures revealed insignificant growth of gram-positive cocci. Patient was given Rocephin while in the hospital, however, Rocephin was discontinued when urine cultures were reported negative. CT scan of abdomen and pelvis was performed, was unremarkable.  It was felt that patient is stable to be discharged home.  Discussion by problem: #1. Acute renal failure, was suspected due to dehydration  because of diarrheal episode at home,  kidney function had improved on IV fluid administration , CT scan of abdomen and pelvis  was normal. Patient will be seen by nephrologist, Dr. Holley Raring who is going to determine further course of action, follow-up.  #2Pyuria and hematuria with right flank pain, was concerning for acute pyonephritis, however, patient's urinary cultures revealed insignificant growth of gram-positive cocci, antibiotics were stopped after 2 days of Rocephin therapy, get ASO, awaiting for nephrologist to see patient in consultation  #3 hypokalemia, supplemented orally, , resolved, magnesium level was checked, normal  #4. Leukocytosis, resolved with the  antibiotic therapy  #5. Fluid overload with mild hypertension, status post Lasix administration last night, etiology is unclear, rule out glomerulonephritis, nephrology consultation is obtained, IV fluids discontinued #6. Tobacco abuse. Counseling, discussed this patient for approximately 4 minutes, nicotine replacement therapy was initiated and to be continued DISCHARGE CONDITIONS:   , Stable   DRUG ALLERGIES:  No Known Allergies  DISCHARGE MEDICATIONS:   Current Discharge Medication List    START taking these medications   Details  HYDROcodone-acetaminophen (NORCO/VICODIN) 5-325 MG tablet Take 1-2 tablets by mouth every 4 (four) hours as needed for moderate pain. Qty: 30 tablet, Refills: 0    nicotine (NICODERM CQ - DOSED IN MG/24 HOURS) 21 mg/24hr patch Place 1 patch (21 mg total) onto the skin daily. Qty: 28 patch, Refills: 0    nicotine polacrilex (NICORETTE) 2 MG gum Take 1 each (2 mg total) by mouth as needed for smoking cessation. Qty: 100 tablet, Refills: 0         DISCHARGE INSTRUCTIONS:    The patient is to follow-up with primary care physician and nephrologist  as outpatient  If you experience worsening of your admission symptoms, develop shortness of breath, life threatening emergency, suicidal or  homicidal thoughts you must seek medical attention immediately by calling 911 or calling your MD immediately  if symptoms less severe.  You Must read complete instructions/literature along with all the possible adverse reactions/side effects for all the Medicines you take and that have been prescribed to you. Take any new Medicines after you have completely understood and accept all the possible adverse reactions/side effects.   Please note  You were cared for by a hospitalist during your hospital stay. If you have any questions about your discharge medications or the care you received while you were in the hospital after you are discharged, you can call the unit and asked to speak with the hospitalist on call if the hospitalist that took care of you is not available. Once you are discharged, your primary care physician will handle any further medical issues. Please note that NO REFILLS for any discharge medications will be authorized once you are discharged, as it is imperative that you return to your primary care physician (or establish a relationship with a primary care physician if you do not have one) for your aftercare needs so that they can reassess your need for medications and monitor your lab values.    Today   CHIEF COMPLAINT:   Chief Complaint  Patient presents with  . Loss of Consciousness  . Spasms  . Altered Mental Status    HISTORY OF PRESENT ILLNESS:  Carl Martinez  is a 25 y.o. male with a known history of Crohn's disease, who presents to the hospital with syncopal episode, lightheadedness, dizziness, muscle cramps and spasms. On arrival to the hospital. He was noted to be in acute renal failure. He complains of dizziness, lightheadedness, right flank pain, some dysuria. Last bowel movement was on Monday, 2 days ago PTA, diarrheal stool. The patient was given IV fluids and developed fluid overload, requiring Lasix administration. Complained of difficulty urinating,  inability to void, however, bladder scan was minimal. There is a hydration. Patient's kidney function normalized. Urine cultures were taken. Due to concerns of urinary tract infection due to pyuria, hematuria, however, urinary cultures revealed insignificant growth of gram-positive cocci. Patient was given Rocephin while in the hospital, however, Rocephin was discontinued when urine cultures were reported negative. CT scan of abdomen and pelvis was performed, was unremarkable.  It was felt that patient is stable to be discharged home.  Discussion by problem: #1. Acute renal failure, was suspected due to dehydration because of diarrheal episode at home,  kidney function had improved on IV fluid administration , CT scan of abdomen and pelvis  was normal. Patient will be seen by nephrologist, Dr. Holley Raring who is going to determine further course of action, follow-up.  #2Pyuria and hematuria with right flank pain, was concerning for acute pyonephritis, however, patient's urinary cultures revealed insignificant growth of gram-positive cocci, antibiotics were stopped after 2 days of Rocephin therapy, get ASO, awaiting for nephrologist to see patient in consultation  #3 hypokalemia, supplemented orally, , resolved, magnesium level was checked, normal  #4. Leukocytosis, resolved with the  antibiotic therapy  #5. Fluid overload with mild hypertension, status post Lasix administration last night, etiology is unclear, rule out glomerulonephritis, nephrology consultation is obtained, IV fluids discontinued #6. Tobacco abuse. Counseling, discussed this patient for approximately 4 minutes, nicotine replacement therapy was initiated and to be continued   VITAL SIGNS:  Blood pressure 135/61, pulse  70, temperature 97.7 F (36.5 C), temperature source Oral, resp. rate 20, height 6' 1"  (1.854 m), weight 110.7 kg (244 lb), SpO2 99 %.  I/O:   Intake/Output Summary (Last 24 hours) at 04/05/16 1228 Last data filed at  04/05/16 0900  Gross per 24 hour  Intake           2858.5 ml  Output              700 ml  Net           2158.5 ml    PHYSICAL EXAMINATION:  GENERAL:  25 y.o.-year-old patient lying in the bed with no acute distress.  EYES: Pupils equal, round, reactive to light and accommodation. No scleral icterus. Extraocular muscles intact.  HEENT: Head atraumatic, normocephalic. Oropharynx and nasopharynx clear.  NECK:  Supple, no jugular venous distention. No thyroid enlargement, no tenderness.  LUNGS: Normal breath sounds bilaterally, no wheezing, rales,rhonchi or crepitation. No use of accessory muscles of respiration.  CARDIOVASCULAR: S1, S2 normal. No murmurs, rubs, or gallops.  ABDOMEN: Soft, non-tender, non-distended. Bowel sounds present. No organomegaly or mass.  EXTREMITIES: No pedal edema, cyanosis, or clubbing.  NEUROLOGIC: Cranial nerves II through XII are intact. Muscle strength 5/5 in all extremities. Sensation intact. Gait not checked.  PSYCHIATRIC: The patient is alert and oriented x 3.  SKIN: No obvious rash, lesion, or ulcer.   DATA REVIEW:   CBC  Recent Labs Lab 04/05/16 0405  WBC 8.4  HGB 15.3  HCT 44.2  PLT 216    Chemistries   Recent Labs Lab 04/04/16 0244 04/05/16 0405  NA 135 139  K 3.4* 4.1  CL 104 110  CO2 24 24  GLUCOSE 97 105*  BUN 20 14  CREATININE 1.70* 1.01  CALCIUM 8.9 9.0  MG 2.0  --   AST 18  --   ALT 21  --   ALKPHOS 78  --   BILITOT 0.8  --     Cardiac Enzymes No results for input(s): TROPONINI in the last 168 hours.  Microbiology Results  Results for orders placed or performed during the hospital encounter of 04/03/16  Urine culture     Status: Abnormal   Collection Time: 04/04/16  5:30 AM  Result Value Ref Range Status   Specimen Description URINE, CLEAN CATCH  Final   Special Requests NONE  Final   Culture (A)  Final    <10,000 COLONIES/mL INSIGNIFICANT GROWTH Performed at [facility]    Report Status 04/05/2016  FINAL  Final    RADIOLOGY:  Ct Abdomen Pelvis Wo Contrast  Result Date: 04/04/2016 CLINICAL DATA:  Decreased urine output EXAM: CT ABDOMEN AND PELVIS WITHOUT CONTRAST TECHNIQUE: Multidetector CT imaging of the abdomen and pelvis was performed following the standard protocol without IV contrast. COMPARISON:  None. FINDINGS: Lower chest:  Within normal limits. Hepatobiliary: The gallbladder has been surgically removed. The liver is within normal limits. Pancreas: Pancreas is unremarkable. No focal mass or inflammatory changes are noted. Spleen: Within normal limits. Adrenals/Urinary Tract: The adrenal glands are unremarkable bilaterally. The kidneys demonstrate no hydronephrosis or focal mass lesion. No renal calculi are seen. The bladder is partially distended. Stomach/Bowel: The appendix is within normal limits. No significant diverticular change is seen. No inflammatory changes are noted. Vascular/Lymphatic: No aneurysmal dilatation is noted. No significant lymphadenopathy is seen. Reproductive: Within normal limits. Musculoskeletal: No acute bony abnormality is noted. Partial sacralization of L5 is noted on the left. IMPRESSION: No acute abnormality noted.  Electronically Signed   By: Inez Catalina M.D.   On: 04/04/2016 13:42    EKG:   Orders placed or performed during the hospital encounter of 04/03/16  . ED EKG  . ED EKG      Management plans discussed with the patient, family and they are in agreement.  CODE STATUS:     Code Status Orders        Start     Ordered   04/04/16 0045  Full code  Continuous     04/04/16 0044    Code Status History    Date Active Date Inactive Code Status Order ID Comments User Context   This patient has a current code status but no historical code status.      TOTAL TIME TAKING CARE OF THIS PATIENT: 40 minutes.    Theodoro Grist M.D on 04/05/2016 at 12:28 PM  Between 7am to 6pm - Pager - [phone]  After 6pm go to [email or web address] - password [email or web address]  [facility]  [facility]  Office  [phone]  CC: Primary care physician; No primary care provider on file.

## 2016-04-06 LAB — BASIC METABOLIC PANEL
ANION GAP: 7 (ref 5–15)
BUN: 15 mg/dL (ref 6–20)
CALCIUM: 9.4 mg/dL (ref 8.9–10.3)
CO2: 27 mmol/L (ref 22–32)
Chloride: 104 mmol/L (ref 101–111)
Creatinine, Ser: 1.02 mg/dL (ref 0.61–1.24)
GFR calc Af Amer: >60 mL/min (ref >60)
Glucose, Bld: 95 mg/dL (ref 65–99)
POTASSIUM: 4.5 mmol/L (ref 3.5–5.1)
SODIUM: 138 mmol/L (ref 135–145)

## 2016-04-06 LAB — CBC
HEMATOCRIT: 45.6 % (ref 40.0–52.0)
Hemoglobin: 15.9 g/dL (ref 13.0–18.0)
MCH: 32.1 pg (ref 26.0–34.0)
MCHC: 34.8 g/dL (ref 32.0–36.0)
MCV: 92.4 fL (ref 80.0–100.0)
PLATELETS: 219 10*3/uL (ref 150–440)
RBC: 4.94 MIL/uL (ref 4.40–5.90)
RDW: 13.1 % (ref 11.5–14.5)
WBC: 7.4 10*3/uL (ref 3.8–10.6)

## 2016-04-06 LAB — ANCA TITERS: P-ANCA: <1:20 {titer}

## 2016-04-06 LAB — ANTISTREPTOLYSIN O TITER: ASO: 69 IU/mL (ref 0.0–200.0)

## 2016-04-06 LAB — GLOMERULAR BASEMENT MEMBRANE ANTIBODIES: GBM AB: 3 U (ref 0–20)

## 2016-04-06 MED ORDER — HYDROCODONE-ACETAMINOPHEN 5-325 MG PO TABS
1.0000 | ORAL_TABLET | ORAL | Status: DC | PRN
  Administered 2016-04-06: 1 via ORAL
  Filled 2016-04-06: qty 1

## 2016-04-06 MED ORDER — PHENAZOPYRIDINE HCL 100 MG PO TABS
100.0000 mg | ORAL_TABLET | Freq: Three times a day (TID) | ORAL | Status: DC
  Administered 2016-04-06 (×2): 100 mg via ORAL
  Filled 2016-04-06 (×2): qty 1

## 2016-04-06 NOTE — Discharge Summary (Signed)
[place] [place] NAME: Carl Martinez    MR#:  [id number]  DATE OF BIRTH:  01-25-1991  DATE OF ADMISSION:  04/03/2016 ADMITTING PHYSICIAN: Ubaldo Glassing Hugelmeyer, DO  DATE OF DISCHARGE: No discharge date for patient encounter.  PRIMARY CARE PHYSICIAN: No primary care provider on file.     ADMISSION DIAGNOSIS:  Hypercalcemia [E83.52] Dehydration [E86.0] AKI (acute kidney injury) ([place]) [N17.9]  DISCHARGE DIAGNOSIS:  Active Problems:   AKI (acute kidney injury) ([place])   Dehydration   Sterile pyuria   Hematuria   Leukocytosis   Hypokalemia   Tobacco abuse   SECONDARY DIAGNOSIS:   Past Medical History:  Diagnosis Date  . Crohn disease ([place])     .pro HOSPITAL COURSE:  The patient is 25 year old Caucasian male with past medical history significant for history of Crohn's disease, who presents to the hospital with syncopal episode, lightheadedness, dizziness, muscle cramps and spasms. On arrival to the hospital. He was noted to be in acute renal failure. He complains of dizziness, lightheadedness, right flank pain, some dysuria. Last bowel movement was on Monday, 2 days ago PTA, diarrheal stool. The patient was given IV fluids and developed fluid overload, requiring Lasix administration. Complained of difficulty urinating, inability to void, however, bladder scan was minimal. There is a hydration. Patient's kidney function normalized. Urine cultures were taken. Due to concerns of urinary tract infection due to pyuria, hematuria, however, urinary cultures revealed insignificant growth of gram-positive cocci. Patient was given Rocephin while in the hospital, however, Rocephin was discontinued when urine cultures were reported negative. CT scan of abdomen and pelvis was performed, was unremarkable.  It was felt that patient is stable to be discharged home. However, patient was not able to void, so he was not discharged on 04/05/2016, his  voiding normalized the following day, he was able to void little amount, with no significant postvoid oh residual. He complained of some discomfort after voiding in bladder area, he was given Pyridium was improvement of his symptoms overall. He was ready to go home.   Discussion by problem: #1. Acute renal failure, was suspected due to dehydration because of diarrheal episode at home,  kidney function had improved on IV fluid administration , CT scan of abdomen and pelvis  was normal. Patient will be seen by nephrologist, Dr. Holley Raring who felt that patient is stable to be discharged home  #2Pyuria and hematuria with right flank pain, was concerning for acute pyonephritis, however, patient's urinary cultures revealed insignificant growth of gram-positive cocci, antibiotics were stopped after 2 days of Rocephin therapy, ASO was normal, anti-GBM antibodies were normal. Patient is to follow-up with primary care physician for further recommendations #3 hypokalemia, supplemented orally, , resolved, magnesium level was checked, normal  #4. Leukocytosis, resolved with the  antibiotic therapy  #5. Fluid overload in the hospital with mild hypertension, status post Lasix administration , IV fluids were discontinued, patient's blood pressure normalized, no further fluid retention was noted by the day of discharge  #6. Tobacco abuse. Counseling, discussed this patient for approximately 4 minutes, nicotine replacement therapy was initiated and to be continued #7, dysuria, was felt to be bladder spasm, patient was given Pyridium 1, it is recommended to continue Pyridium   as outpatient if symptoms are not abating with time. DISCHARGE CONDITIONS:   , Stable   DRUG ALLERGIES:  No Known Allergies  DISCHARGE MEDICATIONS:   Current Discharge Medication List    START taking these medications  Details  HYDROcodone-acetaminophen (NORCO/VICODIN) 5-325 MG tablet Take 1-2 tablets by mouth every 4 (four) hours as needed  for moderate pain. Qty: 30 tablet, Refills: 0    nicotine (NICODERM CQ - DOSED IN MG/24 HOURS) 21 mg/24hr patch Place 1 patch (21 mg total) onto the skin daily. Qty: 28 patch, Refills: 0    nicotine polacrilex (NICORETTE) 2 MG Martinez Take 1 each (2 mg total) by mouth as needed for smoking cessation. Qty: 100 tablet, Refills: 0         DISCHARGE INSTRUCTIONS:    The patient is to follow-up with primary care physician and nephrologist as outpatient  If you experience worsening of your admission symptoms, develop shortness of breath, life threatening emergency, suicidal or homicidal thoughts you must seek medical attention immediately by calling 911 or calling your MD immediately  if symptoms less severe.  You Must read complete instructions/literature along with all the possible adverse reactions/side effects for all the Medicines you take and that have been prescribed to you. Take any new Medicines after you have completely understood and accept all the possible adverse reactions/side effects.   Please note  You were cared for by a hospitalist during your hospital stay. If you have any questions about your discharge medications or the care you received while you were in the hospital after you are discharged, you can call the unit and asked to speak with the hospitalist on call if the hospitalist that took care of you is not available. Once you are discharged, your primary care physician will handle any further medical issues. Please note that NO REFILLS for any discharge medications will be authorized once you are discharged, as it is imperative that you return to your primary care physician (or establish a relationship with a primary care physician if you do not have one) for your aftercare needs so that they can reassess your need for medications and monitor your lab values.    Today   CHIEF COMPLAINT:   Chief Complaint  Patient presents with  . Loss of Consciousness  . Spasms  .  Altered Mental Status    HISTORY OF PRESENT ILLNESS:  Carl Martinez  is a 25 y.o. male with a known history of Crohn's disease, who presents to the hospital with syncopal episode, lightheadedness, dizziness, muscle cramps and spasms. On arrival to the hospital. He was noted to be in acute renal failure. He complains of dizziness, lightheadedness, right flank pain, some dysuria. Last bowel movement was on Monday, 2 days ago PTA, diarrheal stool. The patient was given IV fluids and developed fluid overload, requiring Lasix administration. Complained of difficulty urinating, inability to void, however, bladder scan was minimal. There is a hydration. Patient's kidney function normalized. Urine cultures were taken. Due to concerns of urinary tract infection due to pyuria, hematuria, however, urinary cultures revealed insignificant growth of gram-positive cocci. Patient was given Rocephin while in the hospital, however, Rocephin was discontinued when urine cultures were reported negative. CT scan of abdomen and pelvis was performed, was unremarkable.  It was felt that patient is stable to be discharged home. However, patient was not able to void, so he was not discharged on 04/05/2016, his voiding normalized the following day, he was able to void little amount, with no significant postvoid oh residual. He complained of some discomfort after voiding in bladder area, he was given Pyridium was improvement of his symptoms overall. He was ready to go home.   Discussion by problem: #1. Acute renal failure,  was suspected due to dehydration because of diarrheal episode at home,  kidney function had improved on IV fluid administration , CT scan of abdomen and pelvis  was normal. Patient will be seen by nephrologist, Dr. Holley Raring who felt that patient is stable to be discharged home  #2Pyuria and hematuria with right flank pain, was concerning for acute pyonephritis, however, patient's urinary cultures revealed  insignificant growth of gram-positive cocci, antibiotics were stopped after 2 days of Rocephin therapy, ASO was normal, anti-GBM antibodies were normal. Patient is to follow-up with primary care physician for further recommendations #3 hypokalemia, supplemented orally, , resolved, magnesium level was checked, normal  #4. Leukocytosis, resolved with the  antibiotic therapy  #5. Fluid overload in the hospital with mild hypertension, status post Lasix administration , IV fluids were discontinued, patient's blood pressure normalized, no further fluid retention was noted by the day of discharge  #6. Tobacco abuse. Counseling, discussed this patient for approximately 4 minutes, nicotine replacement therapy was initiated and to be continued #7, dysuria, was felt to be bladder spasm, patient was given Pyridium 1, it is recommended to continue Pyridium   as outpatient if symptoms are not abating with time.   VITAL SIGNS:  Blood pressure 108/69, pulse (!) 59, temperature 97.4 F (36.3 C), temperature source Oral, resp. rate 20, height 6' 1"  (1.854 m), weight 110.7 kg (244 lb), SpO2 99 %.  I/O:    Intake/Output Summary (Last 24 hours) at 04/06/16 1248 Last data filed at 04/06/16 0935  Gross per 24 hour  Intake              483 ml  Output              600 ml  Net             -117 ml    PHYSICAL EXAMINATION:  GENERAL:  25 y.o.-year-old patient lying in the bed with no acute distress.  EYES: Pupils equal, round, reactive to light and accommodation. No scleral icterus. Extraocular muscles intact.  HEENT: Head atraumatic, normocephalic. Oropharynx and nasopharynx clear.  NECK:  Supple, no jugular venous distention. No thyroid enlargement, no tenderness.  LUNGS: Normal breath sounds bilaterally, no wheezing, rales,rhonchi or crepitation. No use of accessory muscles of respiration.  CARDIOVASCULAR: S1, S2 normal. No murmurs, rubs, or gallops.  ABDOMEN: Soft, non-tender, non-distended. Bowel sounds  present. No organomegaly or mass.  EXTREMITIES: No pedal edema, cyanosis, or clubbing.  NEUROLOGIC: Cranial nerves II through XII are intact. Muscle strength 5/5 in all extremities. Sensation intact. Gait not checked.  PSYCHIATRIC: The patient is alert and oriented x 3.  SKIN: No obvious rash, lesion, or ulcer.   DATA REVIEW:   CBC  Recent Labs Lab 04/06/16 0600  WBC 7.4  HGB 15.9  HCT 45.6  PLT 219    Chemistries   Recent Labs Lab 04/04/16 0244  04/06/16 0600  NA 135  < > 138  K 3.4*  < > 4.5  CL 104  < > 104  CO2 24  < > 27  GLUCOSE 97  < > 95  BUN 20  < > 15  CREATININE 1.70*  < > 1.02  CALCIUM 8.9  < > 9.4  MG 2.0  --   --   AST 18  --   --   ALT 21  --   --   ALKPHOS 78  --   --   BILITOT 0.8  --   --   < > =  values in this interval not displayed.  Cardiac Enzymes No results for input(s): TROPONINI in the last 168 hours.  Microbiology Results  Results for orders placed or performed during the hospital encounter of 04/03/16  Urine culture     Status: Abnormal   Collection Time: 04/04/16  5:30 AM  Result Value Ref Range Status   Specimen Description URINE, CLEAN CATCH  Final   Special Requests NONE  Final   Culture (A)  Final    <10,000 COLONIES/mL INSIGNIFICANT GROWTH Performed at [facility]    Report Status 04/05/2016 FINAL  Final    RADIOLOGY:  No results found.  EKG:   Orders placed or performed during the hospital encounter of 04/03/16  . ED EKG  . ED EKG      Management plans discussed with the patient, family and they are in agreement.  CODE STATUS:     Code Status Orders        Start     Ordered   04/04/16 0045  Full code  Continuous     04/04/16 0044    Code Status History    Date Active Date Inactive Code Status Order ID Comments User Context   This patient has a current code status but no historical code status.      TOTAL TIME TAKING CARE OF THIS PATIENT: 40 minutes.    Theodoro Grist M.D on 04/06/2016 at  12:48 PM  Between 7am to 6pm - Pager - [phone]  After 6pm go to [email or web address] - password [email or web address] [facility]  [facility]  Office  [phone]  CC: Primary care physician; No primary care provider on file.

## 2016-04-06 NOTE — Progress Notes (Signed)
Patient discharge teaching given, including activity, diet, follow-up appoints, medications, coupons given to pt to help receive medication. Patient verbalized understanding of all discharge instructions. IV access was d/c'd. Vitals are stable. Skin is intact except as charted in most recent assessments. Pt to be escorted out by volunteer, to be driven home by family.  Angus Seller

## 2016-04-06 NOTE — Progress Notes (Signed)
[facility]  ROUNDING NOTE   Subjective:  Late entry. Patient seen prior to discharge. Status has improved. He urinated this morning. Creatinine normal at 1.02.   Objective:  Vital signs in last 24 hours:  Temp:  [97.4 F (36.3 C)-98.4 F (36.9 C)] 97.4 F (36.3 C) (09/08 0502) Pulse Rate:  [59-78] 59 (09/08 0502) Resp:  [20] 20 (09/08 0502) BP: (108-124)/(66-69) 108/69 (09/08 0502) SpO2:  [98 %-99 %] 99 % (09/08 0502)  Weight change:  Filed Weights   04/03/16 1939 04/04/16 0041  Weight: 108.9 kg (240 lb) 110.7 kg (244 lb)    Intake/Output: I/O last 3 completed shifts: In: 1894 [P.O.:1080; I.V.:814] Out: 1100 [Urine:1100]   Intake/Output this shift:  Total I/O In: -  Out: 200 [Urine:200]  Physical Exam: General: No acute distress  Head: Normocephalic, atraumatic. Moist oral mucosal membranes  Eyes: Anicteric  Neck: Supple, trachea midline  Lungs:  Clear to auscultation, normal effort  Heart: S1S2 no rubs  Abdomen:  Soft, nontender, bowel sounds present  Extremities: no peripheral edema.  Neurologic: Nonfocal, moving all four extremities  Skin: No lesions  Access:     Basic Metabolic Panel:  Recent Labs Lab 04/03/16 1955 04/04/16 0244 04/05/16 0405 04/06/16 0600  NA 136 135 139 138  K 4.0 3.4* 4.1 4.5  CL 97* 104 110 104  CO2 24 24 24 27   GLUCOSE 100* 97 105* 95  BUN 21* 20 14 15   CREATININE 2.79* 1.70* 1.01 1.02  CALCIUM 12.3* 8.9 9.0 9.4  MG  --  2.0  --   --   PHOS  --  3.8  --   --     Liver Function Tests:  Recent Labs Lab 04/03/16 1955 04/04/16 0244  AST 31 18  ALT 32 21  ALKPHOS 105 78  BILITOT 0.9 0.8  PROT 9.9* 7.1  ALBUMIN 5.8* 4.0   No results for input(s): LIPASE, AMYLASE in the last 168 hours. No results for input(s): AMMONIA in the last 168 hours.  CBC:  Recent Labs Lab 04/03/16 1955 04/04/16 0244 04/05/16 0405 04/06/16 0600  WBC 23.7* 15.0* 8.4 7.4  HGB 18.6* 15.5 15.3 15.9  HCT 53.1* 43.6  44.2 45.6  MCV 90.4 90.6 92.7 92.4  PLT 312 229 216 219    Cardiac Enzymes:  Recent Labs Lab 04/03/16 1955  CKTOTAL 184    BNP: Invalid input(s): POCBNP  CBG: No results for input(s): GLUCAP in the last 168 hours.  Microbiology: Results for orders placed or performed during the hospital encounter of 04/03/16  Urine culture     Status: Abnormal   Collection Time: 04/04/16  5:30 AM  Result Value Ref Range Status   Specimen Description URINE, CLEAN CATCH  Final   Special Requests NONE  Final   Culture (A)  Final    <10,000 COLONIES/mL INSIGNIFICANT GROWTH Performed at [facility]    Report Status 04/05/2016 FINAL  Final    Coagulation Studies: No results for input(s): LABPROT, INR in the last 72 hours.  Urinalysis:  Recent Labs  04/04/16 0530  COLORURINE YELLOW*  LABSPEC 1.027  PHURINE 5.0  GLUCOSEU NEGATIVE  HGBUR NEGATIVE  BILIRUBINUR NEGATIVE  KETONESUR TRACE*  PROTEINUR 30*  NITRITE NEGATIVE  LEUKOCYTESUR NEGATIVE      Imaging: No results found.   Medications:     . docusate sodium  100 mg Oral BID  . nicotine  21 mg Transdermal Daily  . phenazopyridine  100 mg Oral TID WC  .  senna  1 tablet Oral Daily  . sodium chloride flush  3 mL Intravenous Q12H   [DISCONTINUED] acetaminophen **OR** acetaminophen, acetaminophen, bisacodyl, HYDROcodone-acetaminophen, magnesium citrate, nicotine polacrilex, ondansetron **OR** ondansetron (ZOFRAN) IV  Assessment/ Plan:  25 y.o. male with a PMHx of Crohn's disease, who was admitted to [facility] on 04/03/2016 for evaluation of syncope.  1.  Acute renal failure. 2.  Syncope. 3.  Dehydration/volume depletion.     Plan:  The patient has improved significantly.  Creatinine has normalized.  We counseled the patient on maintaining adequate fluid hydration status when he is working outside.  Hopefully this problem can be avoided for now as the weather has cooled off a bit.  He will be resting over the weekend.   Patient previously advised to avoid NSAIDs in the recovery period.      LOS: 0 Ashley Bultema 9/8/20173:44 PM

## 2016-04-09 ENCOUNTER — Other Ambulatory Visit
Admission: RE | Admit: 2016-04-09 | Discharge: 2016-04-09 | Disposition: A | Discharge status: Home / Self Care | Payer: Self-pay | Source: Ambulatory Visit | Attending: Nephrology | Admitting: Nephrology

## 2016-04-09 DIAGNOSIS — N179 Acute kidney failure, unspecified: Secondary | ICD-10-CM | POA: Insufficient documentation

## 2016-04-09 LAB — BASIC METABOLIC PANEL
ANION GAP: 6 (ref 5–15)
BUN: 15 mg/dL (ref 6–20)
CALCIUM: 9.5 mg/dL (ref 8.9–10.3)
CO2: 28 mmol/L (ref 22–32)
Chloride: 100 mmol/L — ABNORMAL LOW (ref 101–111)
Creatinine, Ser: 0.92 mg/dL (ref 0.61–1.24)
Glucose, Bld: 100 mg/dL — ABNORMAL HIGH (ref 65–99)
POTASSIUM: 4.3 mmol/L (ref 3.5–5.1)
SODIUM: 134 mmol/L — AB (ref 135–145)

## 2016-04-12 ENCOUNTER — Ambulatory Visit: Payer: Self-pay | Admitting: Urology

## 2016-05-11 ENCOUNTER — Ambulatory Visit (INDEPENDENT_AMBULATORY_CARE_PROVIDER_SITE_OTHER): Payer: Self-pay | Admitting: Urology

## 2016-05-11 ENCOUNTER — Encounter: Payer: Self-pay | Admitting: Urology

## 2016-05-11 DIAGNOSIS — N179 Acute kidney failure, unspecified: Secondary | ICD-10-CM

## 2016-05-21 NOTE — Progress Notes (Signed)
No show

## 2016-06-18 ENCOUNTER — Emergency Department
Admission: EM | Admit: 2016-06-18 | Discharge: 2016-06-18 | Disposition: A | Discharge status: Home / Self Care | Payer: Self-pay | Attending: Emergency Medicine | Admitting: Emergency Medicine

## 2016-06-18 DIAGNOSIS — Z79899 Other long term (current) drug therapy: Secondary | ICD-10-CM | POA: Insufficient documentation

## 2016-06-18 DIAGNOSIS — M549 Dorsalgia, unspecified: Secondary | ICD-10-CM | POA: Insufficient documentation

## 2016-06-18 DIAGNOSIS — R339 Retention of urine, unspecified: Secondary | ICD-10-CM

## 2016-06-18 DIAGNOSIS — F17210 Nicotine dependence, cigarettes, uncomplicated: Secondary | ICD-10-CM | POA: Insufficient documentation

## 2016-06-18 LAB — CBC
HEMATOCRIT: 45.4 % (ref 40.0–52.0)
HEMOGLOBIN: 16.2 g/dL (ref 13.0–18.0)
MCH: 32.6 pg (ref 26.0–34.0)
MCHC: 35.6 g/dL (ref 32.0–36.0)
MCV: 91.4 fL (ref 80.0–100.0)
Platelets: 212 10*3/uL (ref 150–440)
RBC: 4.97 MIL/uL (ref 4.40–5.90)
RDW: 12.9 % (ref 11.5–14.5)
WBC: 9.3 10*3/uL (ref 3.8–10.6)

## 2016-06-18 LAB — BASIC METABOLIC PANEL
ANION GAP: 9 (ref 5–15)
BUN: 14 mg/dL (ref 6–20)
CALCIUM: 9.4 mg/dL (ref 8.9–10.3)
CO2: 23 mmol/L (ref 22–32)
Chloride: 105 mmol/L (ref 101–111)
Creatinine, Ser: 0.92 mg/dL (ref 0.61–1.24)
GLUCOSE: 93 mg/dL (ref 65–99)
POTASSIUM: 3.9 mmol/L (ref 3.5–5.1)
Sodium: 137 mmol/L (ref 135–145)

## 2016-06-18 LAB — URINALYSIS COMPLETE WITH MICROSCOPIC (ARMC ONLY)
BILIRUBIN URINE: NEGATIVE
Bacteria, UA: NONE SEEN
GLUCOSE, UA: NEGATIVE mg/dL
Hgb urine dipstick: NEGATIVE
KETONES UR: NEGATIVE mg/dL
Leukocytes, UA: NEGATIVE
NITRITE: NEGATIVE
Protein, ur: NEGATIVE mg/dL
RBC / HPF: NONE SEEN RBC/hpf (ref 0–5)
Specific Gravity, Urine: 1.011 (ref 1.005–1.030)
pH: 5 (ref 5.0–8.0)

## 2016-06-18 NOTE — Discharge Instructions (Signed)
Please call Dr. Erlene Quan for follow-up appointment as soon as possible. As we discussed return to the emergency department if you develop lower abdominal pain, fever, or if you are unable to urinate for a prolonged period of time.

## 2016-06-18 NOTE — ED Notes (Signed)
Bladder scan shows 439m in bladder..Marland Kitchen

## 2016-06-18 NOTE — ED Provider Notes (Signed)
[facility] Emergency Department Provider Note  Time seen: 4:08 PM  I have reviewed the triage vital signs and the nursing notes.   HISTORY  Chief Complaint Urinary Retention    HPI MATHHEW BUYSSE is a 25 y.o. male with Crohn's disease who presents the emergency department for urinary retention. According to the patient over the past 2 weeks he has been feeling like he cannot urinate fully. States he sees in very short bursts with a very weak stream. Cannot completely empty his bladder. States he is now starting to feel some left-sided back pain. Patient's main concern is several years ago he got very dehydrated with caused kidneys to shut down, he was worried that his kidneys might be shutting down again. In triage the patient had 420 ML's on bladder scan but has not been able to urinate. Patient states he has had extra Percocet left over from prior illness that he has taken over the past 4 days for discomfort.  Past Medical History:  Diagnosis Date  . Crohn disease [facility])     Patient Active Problem List   Diagnosis Date Noted  . Dehydration 04/05/2016  . Sterile pyuria 04/05/2016  . Hematuria 04/05/2016  . Hypokalemia 04/05/2016  . Leukocytosis 04/05/2016  . Tobacco abuse 04/05/2016  . AKI (acute kidney injury) ([place]) 04/03/2016    Past Surgical History:  Procedure Laterality Date  . CHOLECYSTECTOMY      Prior to Admission medications   Medication Sig Start Date End Date Taking? Authorizing Provider  HYDROcodone-acetaminophen (NORCO/VICODIN) 5-325 MG tablet Take 1-2 tablets by mouth every 4 (four) hours as needed for moderate pain. 04/05/16   Theodoro Grist, MD  nicotine (NICODERM CQ - DOSED IN MG/24 HOURS) 21 mg/24hr patch Place 1 patch (21 mg total) onto the skin daily. 04/05/16   Theodoro Grist, MD  nicotine polacrilex (NICORETTE) 2 MG gum Take 1 each (2 mg total) by mouth as needed for smoking cessation. 04/05/16   Theodoro Grist, MD    No Known  Allergies  No family history on file.  Social History Social History  Substance Use Topics  . Smoking status: Current Every Day Smoker    Packs/day: 1.00    Types: Cigarettes  . Smokeless tobacco: Never Used  . Alcohol use No    Review of Systems Constitutional: Negative for fever. Cardiovascular: Negative for chest pain. Respiratory: Negative for shortness of breath. Gastrointestinal: Negative for abdominal pain Genitourinary: Negative for dysuria.Positive for retention. Musculoskeletal: Mild to moderate left back pain Skin: Negative for rash. Neurological: Negative for headache 10-point ROS otherwise negative.  ____________________________________________   PHYSICAL EXAM:  VITAL SIGNS: ED Triage Vitals  Enc Vitals Group     BP 06/18/16 1318 130/72     Pulse Rate 06/18/16 1318 98     Resp 06/18/16 1318 18     Temp 06/18/16 1318 98 F (36.7 C)     Temp Source 06/18/16 1318 Oral     SpO2 06/18/16 1318 99 %     Weight 06/18/16 1318 250 lb (113.4 kg)     Height 06/18/16 1318 6' 1"  (1.854 m)     Head Circumference --      Peak Flow --      Pain Score 06/18/16 1319 8     Pain Loc --      Pain Edu? --      Excl. in [place]? --     Constitutional: Alert and oriented. Well appearing and in no distress. Eyes:  Normal exam ENT   Head: Normocephalic and atraumatic   Mouth/Throat: Mucous membranes are moist. Cardiovascular: Normal rate, regular rhythm. No murmur Respiratory: Normal respiratory effort without tachypnea nor retractions. Breath sounds are clear  Gastrointestinal: Soft and nontender. No distention.  Patient has an area approximately 3 cm in diameter of induration and erythema with a central abrasion consistent with cellulitis/indurated. No fluctuance nothing to drain. Musculoskeletal: Nontender with normal range of motion in all extremities. Mild left CVA tenderness palpation. No right-sided tenderness. Neurologic:  Normal speech and language. No gross  focal neurologic deficits are appreciated. Skin:  Skin is warm, dry and intact.  Psychiatric: Mood and affect are normal.  ____________________________________________   INITIAL IMPRESSION / ASSESSMENT AND PLAN / ED COURSE  Pertinent labs & imaging results that were available during my care of the patient were reviewed by me and considered in my medical decision making (see chart for details).  The patient presents the emergency department with urinary retention over the past 1-2 weeks. Patient states over the past several days he has been taking regular for discomfort and left-sided back pain. Patient's bladder scan shows 72 ML's patient states he feels like he needs to be but he is not able to do so. I discussed with the patient the need to discontinue use of opiates as this can cause urinary retention. Patient was unaware of this. Patient does not take any other medications, the only over-the-counter medications patient takes is aspirin.  Labs are within normal limits. GFR is greater than 60. We'll attempt oral hydration in the emergency department, patient states he feels like he couldn't urinate, we'll attempt to have the patient urinate before initiating any type of catheterization.  Urinalysis large within normal limits. Patient was able to urinate a small amount. Repeat bladder scan shows that he has urinated a decent amount of urine. Patient continues to have urine within his bladder on abdominal ultrasound. I discussed placing a Foley catheter to having patient follow urology. Patient strongly opposed to Foley catheter placement. States he will rather just go home and follow-up with urology. States he has been able to piece small amounts. I discussed with the patient to discontinue use of opiates, follow-up with urology as soon as possible. I discussed return precautions for being unable to urinate, abdominal pain, or fever. Patient is  agreeable.  ____________________________________________   FINAL CLINICAL IMPRESSION(S) / ED DIAGNOSES  Urinary retention    Harvest Dark, MD 06/18/16 1924

## 2016-06-18 NOTE — ED Notes (Signed)
Discharge instructions reviewed with patient. Questions fielded by this RN. Patient verbalizes understanding of instructions. Patient discharged home in stable condition per [facility] MD . No acute distress noted at time of discharge.

## 2016-06-18 NOTE — ED Triage Notes (Signed)
Pt states he is unable to pass urine but only a small amount every 3 days, states he does not have the urge.. Pt also c/o BL LE swelling.Marland Kitchen

## 2016-09-26 ENCOUNTER — Emergency Department
Admission: EM | Admit: 2016-09-26 | Discharge: 2016-09-26 | Disposition: A | Discharge status: Home / Self Care | Payer: Medicaid Other | Attending: Emergency Medicine | Admitting: Emergency Medicine

## 2016-09-26 DIAGNOSIS — F430 Acute stress reaction: Secondary | ICD-10-CM | POA: Insufficient documentation

## 2016-09-26 DIAGNOSIS — R197 Diarrhea, unspecified: Secondary | ICD-10-CM | POA: Diagnosis not present

## 2016-09-26 DIAGNOSIS — F411 Generalized anxiety disorder: Secondary | ICD-10-CM

## 2016-09-26 DIAGNOSIS — F17210 Nicotine dependence, cigarettes, uncomplicated: Secondary | ICD-10-CM | POA: Diagnosis not present

## 2016-09-26 DIAGNOSIS — R109 Unspecified abdominal pain: Secondary | ICD-10-CM | POA: Diagnosis present

## 2016-09-26 DIAGNOSIS — R112 Nausea with vomiting, unspecified: Secondary | ICD-10-CM | POA: Insufficient documentation

## 2016-09-26 LAB — COMPREHENSIVE METABOLIC PANEL
ALK PHOS: 77 U/L (ref 38–126)
ALT: 55 U/L (ref 17–63)
ANION GAP: 10 (ref 5–15)
AST: 41 U/L (ref 15–41)
Albumin: 4.3 g/dL (ref 3.5–5.0)
BILIRUBIN TOTAL: 0.6 mg/dL (ref 0.3–1.2)
BUN: 11 mg/dL (ref 6–20)
CALCIUM: 9.3 mg/dL (ref 8.9–10.3)
CO2: 25 mmol/L (ref 22–32)
CREATININE: 1.08 mg/dL (ref 0.61–1.24)
Chloride: 104 mmol/L (ref 101–111)
Glucose, Bld: 119 mg/dL — ABNORMAL HIGH (ref 65–99)
Potassium: 3.8 mmol/L (ref 3.5–5.1)
Sodium: 139 mmol/L (ref 135–145)
TOTAL PROTEIN: 7.3 g/dL (ref 6.5–8.1)

## 2016-09-26 LAB — CBC
HCT: 46.4 % (ref 40.0–52.0)
HEMOGLOBIN: 16.4 g/dL (ref 13.0–18.0)
MCH: 31.7 pg (ref 26.0–34.0)
MCHC: 35.4 g/dL (ref 32.0–36.0)
MCV: 89.5 fL (ref 80.0–100.0)
Platelets: 245 10*3/uL (ref 150–440)
RBC: 5.18 MIL/uL (ref 4.40–5.90)
RDW: 13.4 % (ref 11.5–14.5)
WBC: 8.5 10*3/uL (ref 3.8–10.6)

## 2016-09-26 LAB — LIPASE, BLOOD: Lipase: 22 U/L (ref 11–51)

## 2016-09-26 MED ORDER — LOPERAMIDE HCL 2 MG PO CAPS
4.0000 mg | ORAL_CAPSULE | Freq: Once | ORAL | Status: AC
  Administered 2016-09-26: 4 mg via ORAL
  Filled 2016-09-26: qty 2

## 2016-09-26 MED ORDER — PROMETHAZINE HCL 25 MG PO TABS
50.0000 mg | ORAL_TABLET | Freq: Once | ORAL | Status: AC
Start: 2016-09-26 — End: 2016-09-26
  Administered 2016-09-26: 50 mg via ORAL
  Filled 2016-09-26: qty 2

## 2016-09-26 MED ORDER — LORAZEPAM 0.5 MG PO TABS: 0.5000 mg | ORAL_TABLET | Freq: Three times a day (TID) | ORAL | 0 refills | Status: DC | PRN

## 2016-09-26 MED ORDER — ONDANSETRON 4 MG PO TBDP: 4.0000 mg | ORAL_TABLET | Freq: Three times a day (TID) | ORAL | 0 refills | Status: DC | PRN

## 2016-09-26 MED ORDER — PROMETHAZINE HCL 25 MG PO TABS: 25.0000 mg | ORAL_TABLET | ORAL | 1 refills | Status: DC | PRN

## 2016-09-26 MED ORDER — SODIUM CHLORIDE 0.9 % IV SOLN
Freq: Once | INTRAVENOUS | Status: AC
  Administered 2016-09-26: 12:00:00 via INTRAVENOUS

## 2016-09-26 MED ORDER — ONDANSETRON HCL 4 MG/2ML IJ SOLN
4.0000 mg | Freq: Once | INTRAMUSCULAR | Status: AC
  Administered 2016-09-26: 4 mg via INTRAVENOUS
  Filled 2016-09-26: qty 2

## 2016-09-26 MED ORDER — LORAZEPAM 2 MG/ML IJ SOLN
1.0000 mg | Freq: Once | INTRAMUSCULAR | Status: AC
  Administered 2016-09-26: 1 mg via INTRAVENOUS
  Filled 2016-09-26: qty 1

## 2016-09-26 MED ORDER — SODIUM CHLORIDE 0.9 % IV SOLN
Freq: Once | INTRAVENOUS | Status: AC
  Administered 2016-09-26: 10:00:00 via INTRAVENOUS

## 2016-09-26 NOTE — ED Notes (Signed)

## 2016-09-26 NOTE — ED Triage Notes (Signed)
He arrives to triage with reports of vomiting for the last three days  He denies being able to keep anything down

## 2016-09-26 NOTE — ED Provider Notes (Signed)
[facility] Emergency Department Provider Note        Time seen: ----------------------------------------- 10:55 AM on 09/26/2016 -----------------------------------------    I have reviewed the triage vital signs and the nursing notes.   HISTORY  Chief Complaint Abdominal Pain and Emesis    HPI Carl Martinez is a 26 y.o. male who presents to ER for vomiting for the last 3 days. Patient states he's not been keeping anything down. He currently has pain that is 8 out of 10 in his abdomen. He does have a history of Crohn's disease. He has had some diarrhea but denies any currently. Nothing makes his symptoms better or worse. He took Pepto-Bismol prior to arrival without any change in his symptoms.   Past Medical History:  Diagnosis Date  . Crohn disease [facility])     Patient Active Problem List   Diagnosis Date Noted  . Dehydration 04/05/2016  . Sterile pyuria 04/05/2016  . Hematuria 04/05/2016  . Hypokalemia 04/05/2016  . Leukocytosis 04/05/2016  . Tobacco abuse 04/05/2016  . AKI (acute kidney injury) ([place]) 04/03/2016    Past Surgical History:  Procedure Laterality Date  . CHOLECYSTECTOMY      Allergies Patient has no known allergies.  Social History Social History  Substance Use Topics  . Smoking status: Current Every Day Smoker    Packs/day: 1.00    Types: Cigarettes  . Smokeless tobacco: Never Used  . Alcohol use No    Review of Systems Constitutional: Negative for fever. Cardiovascular: Negative for chest pain. Respiratory: Negative for shortness of breath. Gastrointestinal: Positive for abdominal pain, vomiting and recent diarrhea Genitourinary: Negative for dysuria. Musculoskeletal: Negative for back pain. Skin: Negative for rash. Neurological: Negative for headaches, focal weakness or numbness.  10-point ROS otherwise negative.  ____________________________________________   PHYSICAL EXAM:  VITAL SIGNS: ED Triage  Vitals  Enc Vitals Group     BP 09/26/16 0934 (!) 110/56     Pulse Rate 09/26/16 0934 95     Resp 09/26/16 0934 18     Temp 09/26/16 0934 97.9 F (36.6 C)     Temp Source 09/26/16 0934 Oral     SpO2 09/26/16 0934 98 %     Weight 09/26/16 0934 250 lb (113.4 kg)     Height 09/26/16 0934 6' 1"  (1.854 m)     Head Circumference --      Peak Flow --      Pain Score 09/26/16 0946 8     Pain Loc --      Pain Edu? --      Excl. in [place]? --     Constitutional: Alert and oriented. Anxious, no distress Eyes: Conjunctivae are normal. PERRL. Normal extraocular movements. ENT   Head: Normocephalic and atraumatic.   Nose: No congestion/rhinnorhea.   Mouth/Throat: Mucous membranes are moist.   Neck: No stridor. Cardiovascular: Normal rate, regular rhythm. No murmurs, rubs, or gallops. Respiratory: Normal respiratory effort without tachypnea nor retractions. Breath sounds are clear and equal bilaterally. No wheezes/rales/rhonchi. Gastrointestinal: Soft and nontender. Normal bowel sounds Musculoskeletal: Nontender with normal range of motion in all extremities. No lower extremity tenderness nor edema. Neurologic:  Normal speech and language. No gross focal neurologic deficits are appreciated.  Skin:  Skin is warm, dry and intact. No rash noted. Psychiatric: Mood and affect are normal. Speech and behavior are normal.  ____________________________________________  ED COURSE:  Pertinent labs & imaging results that were available during my care of the patient were reviewed by  me and considered in my medical decision making (see chart for details). Patient presents to ER with abdominal pain and vomiting. We will assess with labs and consider imaging.   Procedures ____________________________________________   LABS (pertinent positives/negatives)  Labs Reviewed  COMPREHENSIVE METABOLIC PANEL - Abnormal; Notable for the following:       Result Value   Glucose, Bld 119 (*)    All  other components within normal limits  LIPASE, BLOOD  CBC  URINALYSIS, COMPLETE (UACMP) WITH MICROSCOPIC   ____________________________________________  FINAL ASSESSMENT AND PLAN  Vomiting  Plan: Patient with labs and imaging as dictated above. Patient resents to ER for vomiting and occasional diarrhea. His symptoms do seem somewhat anxiety related. Family reports he's under significant stress. He has been placed on fluids and Zofran as well as Ativan. He is stable for outpatient follow-up.   Earleen Newport, MD   Note: This note was generated in part or whole with voice recognition software. Voice recognition is usually quite accurate but there are transcription errors that can and very often do occur. I apologize for any typographical errors that were not detected and corrected.     Earleen Newport, MD 09/26/16 [phone]

## 2016-11-14 ENCOUNTER — Other Ambulatory Visit: Payer: Self-pay | Admitting: Gastroenterology

## 2016-11-14 DIAGNOSIS — R1084 Generalized abdominal pain: Secondary | ICD-10-CM

## 2016-11-14 DIAGNOSIS — K625 Hemorrhage of anus and rectum: Secondary | ICD-10-CM

## 2016-11-27 ENCOUNTER — Ambulatory Visit: Payer: Self-pay | Attending: Gastroenterology

## 2016-12-04 ENCOUNTER — Emergency Department: Payer: Medicaid Other

## 2016-12-04 ENCOUNTER — Emergency Department
Admission: EM | Admit: 2016-12-04 | Discharge: 2016-12-04 | Discharge status: Against Medical Advice | Payer: Medicaid Other | Attending: Student in an Organized Health Care Education/Training Program | Admitting: Student in an Organized Health Care Education/Training Program

## 2016-12-04 DIAGNOSIS — F17210 Nicotine dependence, cigarettes, uncomplicated: Secondary | ICD-10-CM | POA: Diagnosis not present

## 2016-12-04 DIAGNOSIS — R109 Unspecified abdominal pain: Secondary | ICD-10-CM | POA: Insufficient documentation

## 2016-12-04 LAB — CBC WITH DIFFERENTIAL/PLATELET
Basophils Absolute: 0.1 10*3/uL (ref 0–0.1)
Basophils Relative: 1 %
EOS PCT: 1 %
Eosinophils Absolute: 0.2 10*3/uL (ref 0–0.7)
HCT: 45.2 % (ref 40.0–52.0)
Hemoglobin: 15.6 g/dL (ref 13.0–18.0)
LYMPHS ABS: 4.3 10*3/uL — AB (ref 1.0–3.6)
Lymphocytes Relative: 37 %
MCH: 31.3 pg (ref 26.0–34.0)
MCHC: 34.5 g/dL (ref 32.0–36.0)
MCV: 90.6 fL (ref 80.0–100.0)
MONO ABS: 0.7 10*3/uL (ref 0.2–1.0)
Monocytes Relative: 6 %
Neutro Abs: 6.3 10*3/uL (ref 1.4–6.5)
Neutrophils Relative %: 55 %
PLATELETS: 262 10*3/uL (ref 150–440)
RBC: 4.98 MIL/uL (ref 4.40–5.90)
RDW: 13.7 % (ref 11.5–14.5)
WBC: 11.6 10*3/uL — AB (ref 3.8–10.6)

## 2016-12-04 LAB — COMPREHENSIVE METABOLIC PANEL
ALT: 38 U/L (ref 17–63)
AST: 24 U/L (ref 15–41)
Albumin: 4.2 g/dL (ref 3.5–5.0)
Alkaline Phosphatase: 77 U/L (ref 38–126)
Anion gap: 9 (ref 5–15)
BUN: 13 mg/dL (ref 6–20)
CHLORIDE: 106 mmol/L (ref 101–111)
CO2: 22 mmol/L (ref 22–32)
Calcium: 8.9 mg/dL (ref 8.9–10.3)
Creatinine, Ser: 0.75 mg/dL (ref 0.61–1.24)
GLUCOSE: 91 mg/dL (ref 65–99)
POTASSIUM: 3.7 mmol/L (ref 3.5–5.1)
SODIUM: 137 mmol/L (ref 135–145)
Total Bilirubin: 0.5 mg/dL (ref 0.3–1.2)
Total Protein: 7.4 g/dL (ref 6.5–8.1)

## 2016-12-04 LAB — TROPONIN I

## 2016-12-04 LAB — LIPASE, BLOOD: Lipase: 22 U/L (ref 11–51)

## 2016-12-04 MED ORDER — HYDROCODONE-ACETAMINOPHEN 5-325 MG PO TABS
ORAL_TABLET | ORAL | Status: AC
  Administered 2016-12-04: 1 via ORAL
  Filled 2016-12-04: qty 1

## 2016-12-04 MED ORDER — SODIUM CHLORIDE 0.9 % IV BOLUS (SEPSIS)
1000.0000 mL | Freq: Once | INTRAVENOUS | Status: AC
  Administered 2016-12-04: 1000 mL via INTRAVENOUS

## 2016-12-04 MED ORDER — IOPAMIDOL (ISOVUE-300) INJECTION 61%
100.0000 mL | Freq: Once | INTRAVENOUS | Status: AC | PRN
  Administered 2016-12-04: 100 mL via INTRAVENOUS
  Filled 2016-12-04: qty 100

## 2016-12-04 MED ORDER — MORPHINE SULFATE (PF) 4 MG/ML IV SOLN
4.0000 mg | INTRAVENOUS | Status: DC | PRN
  Administered 2016-12-04: 4 mg via INTRAVENOUS
  Filled 2016-12-04: qty 1

## 2016-12-04 MED ORDER — HALOPERIDOL 5 MG PO TABS
ORAL_TABLET | ORAL | Status: AC
  Filled 2016-12-04: qty 1

## 2016-12-04 MED ORDER — HYDROCODONE-ACETAMINOPHEN 5-325 MG PO TABS
1.0000 | ORAL_TABLET | Freq: Once | ORAL | Status: AC
  Administered 2016-12-04: 1 via ORAL

## 2016-12-04 MED ORDER — HALOPERIDOL LACTATE 5 MG/ML IJ SOLN
5.0000 mg | Freq: Once | INTRAMUSCULAR | Status: AC
  Administered 2016-12-04: 5 mg via INTRAVENOUS

## 2016-12-04 MED ORDER — KETOROLAC TROMETHAMINE 30 MG/ML IJ SOLN
15.0000 mg | Freq: Once | INTRAMUSCULAR | Status: AC
  Administered 2016-12-04: 15 mg via INTRAVENOUS

## 2016-12-04 MED ORDER — KETOROLAC TROMETHAMINE 30 MG/ML IJ SOLN
INTRAMUSCULAR | Status: AC
  Administered 2016-12-04: 15 mg via INTRAVENOUS
  Filled 2016-12-04: qty 1

## 2016-12-04 MED ORDER — HALOPERIDOL LACTATE 5 MG/ML IJ SOLN
INTRAMUSCULAR | Status: AC
  Administered 2016-12-04: 5 mg via INTRAVENOUS
  Filled 2016-12-04: qty 1

## 2016-12-04 NOTE — ED Notes (Signed)
Per ems pt reports flank pain. Pt to ed from home via ems. Pt with hx of kidney stones.  VSS. Ems gave.  179mg IV Fentenyl 453mzofran IV 500c Normal Saline Recent biospy for possible colon ca.

## 2016-12-04 NOTE — ED Notes (Signed)
Pt reports he can not give urine sample. MD informed pt that urine would be needed for complete work up. Pt requested to leave AMA and signed to do so. Pt denied wanting vitals checked. IV removed and pt ambulated out of ED independently and without difficulty.

## 2016-12-04 NOTE — ED Provider Notes (Signed)
[facility] Emergency Department Provider Note    None    (approximate)  I have reviewed the triage vital signs and the nursing notes.   HISTORY  Chief Complaint Back Pain and Flank Pain    HPI Carl Martinez is a 26 y.o. male with a history of Crohn's disease and kidney stones presents with acute onset left flank pain that occurred while the patient was visiting his mother in the hospital. Patient states that he has had decreased urine output for the past day. States the pain is 10 out of 10 in severity. Patient does appear uncomfortable in the ER. States that the pain is stabbing in nature without radiation. Does not feel like his Crohn's or kidney stones.   Past Medical History:  Diagnosis Date  . Crohn disease ([place])    No family history on file. Past Surgical History:  Procedure Laterality Date  . CHOLECYSTECTOMY     Patient Active Problem List   Diagnosis Date Noted  . Dehydration 04/05/2016  . Sterile pyuria 04/05/2016  . Hematuria 04/05/2016  . Hypokalemia 04/05/2016  . Leukocytosis 04/05/2016  . Tobacco abuse 04/05/2016  . AKI (acute kidney injury) ([place]) 04/03/2016      Prior to Admission medications   Medication Sig Start Date End Date Taking? Authorizing Provider  HYDROcodone-acetaminophen (NORCO/VICODIN) 5-325 MG tablet Take 1-2 tablets by mouth every 4 (four) hours as needed for moderate pain. 04/05/16   Theodoro Grist, MD  LORazepam (ATIVAN) 0.5 MG tablet Take 1 tablet (0.5 mg total) by mouth every 8 (eight) hours as needed for anxiety. 09/26/16 09/26/17  Earleen Newport, MD  nicotine (NICODERM CQ - DOSED IN MG/24 HOURS) 21 mg/24hr patch Place 1 patch (21 mg total) onto the skin daily. 04/05/16   Theodoro Grist, MD  nicotine polacrilex (NICORETTE) 2 MG gum Take 1 each (2 mg total) by mouth as needed for smoking cessation. 04/05/16   Theodoro Grist, MD  ondansetron (ZOFRAN ODT) 4 MG disintegrating tablet Take 1 tablet (4 mg  total) by mouth every 8 (eight) hours as needed for nausea or vomiting. 09/26/16   Earleen Newport, MD  promethazine (PHENERGAN) 25 MG tablet Take 1 tablet (25 mg total) by mouth every 4 (four) hours as needed for nausea or vomiting. 09/26/16   Earleen Newport, MD    Allergies Patient has no known allergies.    Social History Social History  Substance Use Topics  . Smoking status: Current Every Day Smoker    Packs/day: 1.00    Types: Cigarettes  . Smokeless tobacco: Never Used  . Alcohol use No    Review of Systems Patient denies headaches, rhinorrhea, blurry vision, numbness, shortness of breath, chest pain, edema, cough, abdominal pain, nausea, vomiting, diarrhea, dysuria, fevers, rashes or hallucinations unless otherwise stated above in HPI. ____________________________________________   PHYSICAL EXAM:  VITAL SIGNS: Vitals:   12/04/16 1844  BP: 117/74  Pulse: 98  Resp: 16  Temp: 98.2 F (36.8 C)    Constitutional: Alert and oriented. Walking about er room, uncomfortable but in no acute distress. Eyes: Conjunctivae are normal. PERRL. EOMI. Head: Atraumatic. Nose: No congestion/rhinnorhea. Mouth/Throat: Mucous membranes are moist.  Oropharynx non-erythematous. Neck: No stridor. Painless ROM. No cervical spine tenderness to palpation Hematological/Lymphatic/Immunilogical: No cervical lymphadenopathy. Cardiovascular: Normal rate, regular rhythm. Grossly normal heart sounds.  Good peripheral circulation. Respiratory: Normal respiratory effort.  No retractions. Lungs CTAB. Gastrointestinal: Soft and nontender. No distention. No abdominal bruits. + left CVA tenderness.  Musculoskeletal: No lower extremity tenderness nor edema.  No joint effusions. Neurologic:  Normal speech and language. No gross focal neurologic deficits are appreciated. No gait instability. Skin:  Skin is warm, dry and intact. No rash noted. Psychiatric: Mood and affect are normal. Speech and  behavior are normal.  ____________________________________________   LABS (all labs ordered are listed, but only abnormal results are displayed)  Results for orders placed or performed during the hospital encounter of 12/04/16 (from the past 24 hour(s))  CBC with Differential     Status: Abnormal   Collection Time: 12/04/16  6:46 PM  Result Value Ref Range   WBC 11.6 (H) 3.8 - 10.6 K/uL   RBC 4.98 4.40 - 5.90 MIL/uL   Hemoglobin 15.6 13.0 - 18.0 g/dL   HCT 45.2 40.0 - 52.0 %   MCV 90.6 80.0 - 100.0 fL   MCH 31.3 26.0 - 34.0 pg   MCHC 34.5 32.0 - 36.0 g/dL   RDW 13.7 11.5 - 14.5 %   Platelets 262 150 - 440 K/uL   Neutrophils Relative % 55 %   Neutro Abs 6.3 1.4 - 6.5 K/uL   Lymphocytes Relative 37 %   Lymphs Abs 4.3 (H) 1.0 - 3.6 K/uL   Monocytes Relative 6 %   Monocytes Absolute 0.7 0.2 - 1.0 K/uL   Eosinophils Relative 1 %   Eosinophils Absolute 0.2 0 - 0.7 K/uL   Basophils Relative 1 %   Basophils Absolute 0.1 0 - 0.1 K/uL  Comprehensive metabolic panel     Status: None   Collection Time: 12/04/16  6:46 PM  Result Value Ref Range   Sodium 137 135 - 145 mmol/L   Potassium 3.7 3.5 - 5.1 mmol/L   Chloride 106 101 - 111 mmol/L   CO2 22 22 - 32 mmol/L   Glucose, Bld 91 65 - 99 mg/dL   BUN 13 6 - 20 mg/dL   Creatinine, Ser 0.75 0.61 - 1.24 mg/dL   Calcium 8.9 8.9 - 10.3 mg/dL   Total Protein 7.4 6.5 - 8.1 g/dL   Albumin 4.2 3.5 - 5.0 g/dL   AST 24 15 - 41 U/L   ALT 38 17 - 63 U/L   Alkaline Phosphatase 77 38 - 126 U/L   Total Bilirubin 0.5 0.3 - 1.2 mg/dL   GFR calc non Af Amer >60 >60 mL/min   GFR calc Af Amer >60 >60 mL/min   Anion gap 9 5 - 15  Lipase, blood     Status: None   Collection Time: 12/04/16  6:46 PM  Result Value Ref Range   Lipase 22 11 - 51 U/L  Troponin I     Status: None   Collection Time: 12/04/16  6:48 PM  Result Value Ref Range   Troponin I <0.03 <0.03 ng/mL   ____________________________________________  EKG My review and personal  interpretation at Time: 20:32   Indication: flank pain  Rate: 70  Rhythm: sinus Axis: normal Other: normal intervals, no st elevations ____________________________________________  RADIOLOGY  I personally reviewed all radiographic images ordered to evaluate for the above acute complaints and reviewed radiology reports and findings.  These findings were personally discussed with the patient.  Please see medical record for radiology report.  ____________________________________________   PROCEDURES  Procedure(s) performed:  Procedures    Critical Care performed: no ____________________________________________   INITIAL IMPRESSION / ASSESSMENT AND PLAN / ED COURSE  Pertinent labs & imaging results that were available during my care of the  patient were reviewed by me and considered in my medical decision making (see chart for details).  DDX: stone, crohns, msk strain, shingles, spasm, constipation  Carl Martinez is a 26 y.o. who presents to the ED with acute left flank pain as described above. Patient with mild leukocytosis but blood work is otherwise reassuring. His abdominal exam soft and benign. CT imaging ordered due to concern for acute stone or complication of Crohn's disease. CT imaging was reassuring. Patient had not voided urinalysis. Therefore did encourage him to provide urine. Patient was being given IV fluids. Y did stepped away to evaluate another patient after encouraging him to provide a urine, the patient informed nursing staff that he was given a be leaving and did not want to stay any longer. The nursing and appropriately informed the patient that he'll be leaving [place]. I went to go inform the patient that it would be more appropriate for him to provide a urine sample, we can continue to treat his discomfort, so that we can further evaluate for any evidence of underlying infectious process but the patient had already eloped from the ER.       ____________________________________________   FINAL CLINICAL IMPRESSION(S) / ED DIAGNOSES  Final diagnoses:  Acute left flank pain      NEW MEDICATIONS STARTED DURING THIS VISIT:  Discharge Medication List as of 12/04/2016 10:13 PM       Note:  This document was prepared using Dragon voice recognition software and may include unintentional dictation errors.    Merlyn Lot, MD 12/05/16 [phone]

## 2016-12-04 NOTE — ED Notes (Signed)
MD at bedside. 

## 2016-12-04 NOTE — ED Notes (Signed)
RN called lab to inform of add on troponin.

## 2016-12-04 NOTE — ED Notes (Addendum)
Pt is unable to void at this time - pt started with lower back pain and flank pain - see triage note

## 2016-12-04 NOTE — ED Triage Notes (Signed)
Pt started with lower back pain and left flank pain yesterday - pt has a history of kidney stones but reports this is not the same pain - pt reports N/V

## 2016-12-12 ENCOUNTER — Encounter: Payer: Self-pay | Admitting: Emergency Medicine

## 2016-12-12 ENCOUNTER — Emergency Department
Admission: EM | Admit: 2016-12-12 | Discharge: 2016-12-13 | Disposition: A | Discharge status: Other Acute Inpatient Hospital | Payer: Medicaid Other | Attending: Emergency Medicine | Admitting: Emergency Medicine

## 2016-12-12 DIAGNOSIS — R440 Auditory hallucinations: Secondary | ICD-10-CM | POA: Insufficient documentation

## 2016-12-12 DIAGNOSIS — F17210 Nicotine dependence, cigarettes, uncomplicated: Secondary | ICD-10-CM | POA: Insufficient documentation

## 2016-12-12 DIAGNOSIS — R45850 Homicidal ideations: Secondary | ICD-10-CM | POA: Insufficient documentation

## 2016-12-12 DIAGNOSIS — Z79899 Other long term (current) drug therapy: Secondary | ICD-10-CM | POA: Insufficient documentation

## 2016-12-12 DIAGNOSIS — F29 Unspecified psychosis not due to a substance or known physiological condition: Secondary | ICD-10-CM

## 2016-12-12 DIAGNOSIS — K5090 Crohn's disease, unspecified, without complications: Secondary | ICD-10-CM

## 2016-12-12 DIAGNOSIS — R443 Hallucinations, unspecified: Secondary | ICD-10-CM

## 2016-12-12 LAB — COMPREHENSIVE METABOLIC PANEL
ALK PHOS: 86 U/L (ref 38–126)
ALT: 42 U/L (ref 17–63)
ANION GAP: 10 (ref 5–15)
AST: 31 U/L (ref 15–41)
Albumin: 4.3 g/dL (ref 3.5–5.0)
BUN: 11 mg/dL (ref 6–20)
CALCIUM: 9.4 mg/dL (ref 8.9–10.3)
CO2: 25 mmol/L (ref 22–32)
CREATININE: 0.93 mg/dL (ref 0.61–1.24)
Chloride: 102 mmol/L (ref 101–111)
GFR calc Af Amer: >60 mL/min (ref >60)
GFR calc non Af Amer: >60 mL/min (ref >60)
GLUCOSE: 92 mg/dL (ref 65–99)
Potassium: 3.8 mmol/L (ref 3.5–5.1)
SODIUM: 137 mmol/L (ref 135–145)
Total Bilirubin: 0.6 mg/dL (ref 0.3–1.2)
Total Protein: 7.5 g/dL (ref 6.5–8.1)

## 2016-12-12 LAB — ETHANOL: Alcohol, Ethyl (B): <5 mg/dL (ref <5)

## 2016-12-12 LAB — SALICYLATE LEVEL: Salicylate Lvl: 11.2 mg/dL (ref 2.8–30.0)

## 2016-12-12 LAB — CBC
HEMATOCRIT: 46 % (ref 40.0–52.0)
HEMOGLOBIN: 16 g/dL (ref 13.0–18.0)
MCH: 31.2 pg (ref 26.0–34.0)
MCHC: 34.7 g/dL (ref 32.0–36.0)
MCV: 89.8 fL (ref 80.0–100.0)
Platelets: 216 10*3/uL (ref 150–440)
RBC: 5.12 MIL/uL (ref 4.40–5.90)
RDW: 13.6 % (ref 11.5–14.5)
WBC: 12.8 10*3/uL — ABNORMAL HIGH (ref 3.8–10.6)

## 2016-12-12 LAB — URINE DRUG SCREEN, QUALITATIVE (ARMC ONLY)
Amphetamines, Ur Screen: NOT DETECTED
BARBITURATES, UR SCREEN: NOT DETECTED
Benzodiazepine, Ur Scrn: POSITIVE — AB
COCAINE METABOLITE, UR ~~LOC~~: NOT DETECTED
Cannabinoid 50 Ng, Ur ~~LOC~~: NOT DETECTED
MDMA (Ecstasy)Ur Screen: NOT DETECTED
METHADONE SCREEN, URINE: NOT DETECTED
OPIATE, UR SCREEN: NOT DETECTED
Phencyclidine (PCP) Ur S: NOT DETECTED
Tricyclic, Ur Screen: NOT DETECTED

## 2016-12-12 LAB — ACETAMINOPHEN LEVEL

## 2016-12-12 MED ORDER — BENZTROPINE MESYLATE 1 MG PO TABS: 1.0000 mg | ORAL_TABLET | Freq: Two times a day (BID) | ORAL | Status: DC | PRN

## 2016-12-12 MED ORDER — LORAZEPAM 1 MG PO TABS
1.0000 mg | ORAL_TABLET | Freq: Once | ORAL | Status: AC
  Administered 2016-12-12: 1 mg via ORAL
  Filled 2016-12-12: qty 1

## 2016-12-12 MED ORDER — HALOPERIDOL 2 MG PO TABS
2.0000 mg | ORAL_TABLET | Freq: Two times a day (BID) | ORAL | Status: DC
  Administered 2016-12-12: 2 mg via ORAL
  Filled 2016-12-12 (×3): qty 1

## 2016-12-12 MED ORDER — NICOTINE 21 MG/24HR TD PT24
21.0000 mg | MEDICATED_PATCH | Freq: Every day | TRANSDERMAL | Status: DC
  Administered 2016-12-12: 21 mg via TRANSDERMAL
  Filled 2016-12-12: qty 1

## 2016-12-12 NOTE — ED Notes (Signed)

## 2016-12-12 NOTE — ED Triage Notes (Signed)
Pt comes into the ED via POV with his family c/o hallucinations and homicidal ideation.  Patient denies any suicidal ideation at this time.  Patient states he has never been diagnosed with depression, anxiety, bipolar, or schizophrenia.  Patient cooperative at this time and in NAD.  Patient states this has been an ongoing problem for about 6 months and has increasingly gotten worse.

## 2016-12-12 NOTE — ED Notes (Signed)
Pt. To BHU from ED ambulatory without difficulty, to room  BHU4. Report from [employer]. Pt. Is alert and oriented, warm and dry in no distress. Pt. Denies SI, and AH. Pt report HI but would not indulge in plan or who. Pt also report hearing a voice, but states it like his own voice or a demon. Pt. Calm and cooperative. Pt. Made aware of security cameras and Q15 minute rounds. Pt. Encouraged to let Nursing staff know of any concerns or needs.

## 2016-12-12 NOTE — ED Notes (Signed)
The patient was dressed out into the required purple scrubs. The patients girlfriend also present in the room at this time.  Patient was very polite and understanding about the dressing out process. Belongs were placed into a white patient belongings bag and labeled properly. Bag 1 of 1. He was then walked to ED room #23 and report given to nurse and tech. Patient was unable to urinate at this time. Specimen cup left with patient. Cloths left with RN Tamala Julian.
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
## 2016-12-12 NOTE — ED Provider Notes (Signed)
[facility] Emergency Department Provider Note  ____________________________________________   First MD Initiated Contact with Patient 12/12/16 1534     (approximate)  I have reviewed the triage vital signs and the nursing notes.   HISTORY  Chief Complaint Hallucinations and Homicidal   HPI Carl Martinez is a 26 y.o. male history of Crohn's disease was presenting to the emergency department with worsening command hallucinations. He says that he thinks he has "a demon." He says that he has been hearing voices for about a year now telling him to kill his family. He says that he is not under any worsening stress but says that the voices have been getting more persistent, especially at night. He denies any drinking or drug use.   Past Medical History:  Diagnosis Date  . Crohn disease [facility])     Patient Active Problem List   Diagnosis Date Noted  . Dehydration 04/05/2016  . Sterile pyuria 04/05/2016  . Hematuria 04/05/2016  . Hypokalemia 04/05/2016  . Leukocytosis 04/05/2016  . Tobacco abuse 04/05/2016  . AKI (acute kidney injury) ([place]) 04/03/2016    Past Surgical History:  Procedure Laterality Date  . CHOLECYSTECTOMY      Prior to Admission medications   Medication Sig Start Date End Date Taking? Authorizing Provider  HYDROcodone-acetaminophen (NORCO/VICODIN) 5-325 MG tablet Take 1-2 tablets by mouth every 4 (four) hours as needed for moderate pain. 04/05/16   Theodoro Grist, MD  LORazepam (ATIVAN) 0.5 MG tablet Take 1 tablet (0.5 mg total) by mouth every 8 (eight) hours as needed for anxiety. 09/26/16 09/26/17  Earleen Newport, MD  nicotine (NICODERM CQ - DOSED IN MG/24 HOURS) 21 mg/24hr patch Place 1 patch (21 mg total) onto the skin daily. 04/05/16   Theodoro Grist, MD  nicotine polacrilex (NICORETTE) 2 MG gum Take 1 each (2 mg total) by mouth as needed for smoking cessation. 04/05/16   Theodoro Grist, MD  ondansetron (ZOFRAN ODT) 4 MG  disintegrating tablet Take 1 tablet (4 mg total) by mouth every 8 (eight) hours as needed for nausea or vomiting. 09/26/16   Earleen Newport, MD  promethazine (PHENERGAN) 25 MG tablet Take 1 tablet (25 mg total) by mouth every 4 (four) hours as needed for nausea or vomiting. 09/26/16   Earleen Newport, MD    Allergies Patient has no known allergies.  No family history on file.  Social History Social History  Substance Use Topics  . Smoking status: Current Every Day Smoker    Packs/day: 1.00    Types: Cigarettes  . Smokeless tobacco: Never Used  . Alcohol use No    Review of Systems  Constitutional: No fever/chills Eyes: No visual changes. ENT: No sore throat. Cardiovascular: Denies chest pain. Respiratory: Denies shortness of breath. Gastrointestinal: No abdominal pain.  No nausea, no vomiting.  No diarrhea.  No constipation. Genitourinary: Negative for dysuria. Musculoskeletal: Negative for back pain. Skin: Negative for rash. Neurological: Negative for headaches, focal weakness or numbness.   ____________________________________________   PHYSICAL EXAM:  VITAL SIGNS: ED Triage Vitals  Enc Vitals Group     BP 12/12/16 1516 (!) 162/83     Pulse Rate 12/12/16 1516 90     Resp 12/12/16 1516 15     Temp 12/12/16 1516 98.5 F (36.9 C)     Temp Source 12/12/16 1516 Oral     SpO2 12/12/16 1516 99 %     Weight 12/12/16 1517 270 lb (122.5 kg)  Height 12/12/16 1517 6' (1.829 m)     Head Circumference --      Peak Flow --      Pain Score 12/12/16 1516 5     Pain Loc --      Pain Edu? --      Excl. in [place]? --     Constitutional: Alert and oriented. Well appearing and in no acute distress. Eyes: Conjunctivae are normal.  Head: Atraumatic. Nose: No congestion/rhinnorhea. Mouth/Throat: Mucous membranes are moist.  Neck: No stridor.   Cardiovascular: Normal rate, regular rhythm. Grossly normal heart sounds.  Respiratory: Normal respiratory effort.  No  retractions. Lungs CTAB. Gastrointestinal: Soft and nontender. No distention. No CVA tenderness. Musculoskeletal: No lower extremity tenderness nor edema.  No joint effusions. Neurologic:  Normal speech and language. No gross focal neurologic deficits are appreciated. Skin:  Skin is warm, dry and intact. No rash noted. Psychiatric: Mood and affect are normal. Speech and behavior are normal.  ____________________________________________   LABS (all labs ordered are listed, but only abnormal results are displayed)  Labs Reviewed  COMPREHENSIVE METABOLIC PANEL  ETHANOL  SALICYLATE LEVEL  ACETAMINOPHEN LEVEL  CBC  URINE DRUG SCREEN, QUALITATIVE ([place])   ____________________________________________  EKG   ____________________________________________  RADIOLOGY   ____________________________________________   PROCEDURES  Procedure(s) performed:   Procedures  Critical Care performed:   ____________________________________________   INITIAL IMPRESSION / ASSESSMENT AND PLAN / ED COURSE  Pertinent labs & imaging results that were available during my care of the patient were reviewed by me and considered in my medical decision making (see chart for details).  Patient will be placed under involuntary commitment. Psychiatry to see.      ____________________________________________   FINAL CLINICAL IMPRESSION(S) / ED DIAGNOSES  Command hallucinations. Homicidal ideation.    NEW MEDICATIONS STARTED DURING THIS VISIT:  New Prescriptions   No medications on file     Note:  This document was prepared using Dragon voice recognition software and may include unintentional dictation errors.     Orbie Pyo, MD 12/12/16 [id number]

## 2016-12-12 NOTE — BH Assessment (Signed)
Patient is to be admitted to [place] by Dr. Weber Cooks.  Attending Physician will be Dr. Bary Leriche.   Patient has been assigned to room 313, by [place] Nurse Burnard Bunting, RN.   Intake Paper Work has been signed and placed on patient chart.  ER staff is aware of the admission [facility] Dr. Clearnce Hasten, ER MD; Patient's Nurse & Patient Access).

## 2016-12-12 NOTE — ED Notes (Signed)
Sandwich and soft drink given.

## 2016-12-12 NOTE — ED Notes (Addendum)
Pt anxious, states, "I feel like the walls are closing in."  MD notified.  See new orders.

## 2016-12-13 ENCOUNTER — Inpatient Hospital Stay
Admission: RE | Admit: 2016-12-13 | Discharge: 2016-12-18 | DRG: 885 | Disposition: A | Discharge status: Home / Self Care | Payer: Medicaid Other | Source: Intra-hospital | Attending: Psychiatry | Admitting: Psychiatry

## 2016-12-13 DIAGNOSIS — F17210 Nicotine dependence, cigarettes, uncomplicated: Secondary | ICD-10-CM | POA: Diagnosis present

## 2016-12-13 DIAGNOSIS — M549 Dorsalgia, unspecified: Secondary | ICD-10-CM | POA: Diagnosis present

## 2016-12-13 DIAGNOSIS — Z886 Allergy status to analgesic agent status: Secondary | ICD-10-CM

## 2016-12-13 DIAGNOSIS — Z818 Family history of other mental and behavioral disorders: Secondary | ICD-10-CM | POA: Diagnosis not present

## 2016-12-13 DIAGNOSIS — F4310 Post-traumatic stress disorder, unspecified: Secondary | ICD-10-CM | POA: Diagnosis present

## 2016-12-13 DIAGNOSIS — F411 Generalized anxiety disorder: Secondary | ICD-10-CM | POA: Diagnosis present

## 2016-12-13 DIAGNOSIS — G8929 Other chronic pain: Secondary | ICD-10-CM | POA: Diagnosis present

## 2016-12-13 DIAGNOSIS — F3164 Bipolar disorder, current episode mixed, severe, with psychotic features: Principal | ICD-10-CM | POA: Diagnosis present

## 2016-12-13 DIAGNOSIS — K219 Gastro-esophageal reflux disease without esophagitis: Secondary | ICD-10-CM | POA: Diagnosis present

## 2016-12-13 DIAGNOSIS — Z79899 Other long term (current) drug therapy: Secondary | ICD-10-CM

## 2016-12-13 DIAGNOSIS — F410 Panic disorder [episodic paroxysmal anxiety] without agoraphobia: Secondary | ICD-10-CM | POA: Diagnosis present

## 2016-12-13 DIAGNOSIS — K5090 Crohn's disease, unspecified, without complications: Secondary | ICD-10-CM | POA: Diagnosis present

## 2016-12-13 DIAGNOSIS — F17200 Nicotine dependence, unspecified, uncomplicated: Secondary | ICD-10-CM | POA: Diagnosis present

## 2016-12-13 DIAGNOSIS — E860 Dehydration: Secondary | ICD-10-CM | POA: Diagnosis present

## 2016-12-13 DIAGNOSIS — G4700 Insomnia, unspecified: Secondary | ICD-10-CM | POA: Diagnosis present

## 2016-12-13 DIAGNOSIS — F4010 Social phobia, unspecified: Secondary | ICD-10-CM | POA: Diagnosis present

## 2016-12-13 DIAGNOSIS — R45850 Homicidal ideations: Secondary | ICD-10-CM | POA: Diagnosis not present

## 2016-12-13 DIAGNOSIS — F29 Unspecified psychosis not due to a substance or known physiological condition: Secondary | ICD-10-CM | POA: Diagnosis not present

## 2016-12-13 LAB — LIPID PANEL
Cholesterol: 224 mg/dL — ABNORMAL HIGH (ref 0–200)
HDL: 32 mg/dL — AB (ref >40)
LDL Cholesterol: 150 mg/dL — ABNORMAL HIGH (ref 0–99)
TRIGLYCERIDES: 211 mg/dL — AB (ref <150)
Total CHOL/HDL Ratio: 7 RATIO
VLDL: 42 mg/dL — ABNORMAL HIGH (ref 0–40)

## 2016-12-13 LAB — TSH: TSH: 1.708 u[IU]/mL (ref 0.350–4.500)

## 2016-12-13 MED ORDER — HALOPERIDOL 5 MG PO TABS
10.0000 mg | ORAL_TABLET | Freq: Every day | ORAL | Status: DC
  Administered 2016-12-13: 10 mg via ORAL
  Filled 2016-12-13: qty 2

## 2016-12-13 MED ORDER — BENZTROPINE MESYLATE 1 MG PO TABS
1.0000 mg | ORAL_TABLET | Freq: Two times a day (BID) | ORAL | Status: DC | PRN
  Administered 2016-12-13: 1 mg via ORAL
  Filled 2016-12-13: qty 1

## 2016-12-13 MED ORDER — ALUM & MAG HYDROXIDE-SIMETH 200-200-20 MG/5ML PO SUSP
30.0000 mL | ORAL | Status: DC | PRN
  Administered 2016-12-14 – 2016-12-15 (×2): 30 mL via ORAL
  Filled 2016-12-13 (×3): qty 30

## 2016-12-13 MED ORDER — TRAZODONE HCL 50 MG PO TABS
ORAL_TABLET | ORAL | Status: AC
  Filled 2016-12-13: qty 2

## 2016-12-13 MED ORDER — HALOPERIDOL 5 MG PO TABS: 5.0000 mg | ORAL_TABLET | Freq: Two times a day (BID) | ORAL | Status: DC

## 2016-12-13 MED ORDER — HALOPERIDOL 0.5 MG PO TABS
2.0000 mg | ORAL_TABLET | Freq: Two times a day (BID) | ORAL | Status: DC
  Administered 2016-12-13: 2 mg via ORAL
  Filled 2016-12-13: qty 4

## 2016-12-13 MED ORDER — MAGNESIUM HYDROXIDE 400 MG/5ML PO SUSP: 30.0000 mL | Freq: Every day | ORAL | Status: DC | PRN

## 2016-12-13 MED ORDER — TRAZODONE HCL 100 MG PO TABS
100.0000 mg | ORAL_TABLET | Freq: Every evening | ORAL | Status: DC | PRN
  Administered 2016-12-13: 100 mg via ORAL

## 2016-12-13 MED ORDER — CYCLOBENZAPRINE HCL 10 MG PO TABS
5.0000 mg | ORAL_TABLET | Freq: Every day | ORAL | Status: DC
  Administered 2016-12-13 (×2): 5 mg via ORAL
  Filled 2016-12-13 (×2): qty 1

## 2016-12-13 MED ORDER — NICOTINE 21 MG/24HR TD PT24
21.0000 mg | MEDICATED_PATCH | Freq: Every day | TRANSDERMAL | Status: DC
  Administered 2016-12-13 – 2016-12-18 (×6): 21 mg via TRANSDERMAL
  Filled 2016-12-13 (×6): qty 1

## 2016-12-13 MED ORDER — HYDROXYZINE HCL 25 MG PO TABS
25.0000 mg | ORAL_TABLET | Freq: Three times a day (TID) | ORAL | Status: DC | PRN
Start: 2016-12-13 — End: 2016-12-18
  Administered 2016-12-13 – 2016-12-18 (×12): 25 mg via ORAL
  Filled 2016-12-13 (×12): qty 1

## 2016-12-13 MED ORDER — PRAZOSIN HCL 1 MG PO CAPS
1.0000 mg | ORAL_CAPSULE | Freq: Two times a day (BID) | ORAL | Status: DC
  Administered 2016-12-13 – 2016-12-14 (×3): 1 mg via ORAL
  Filled 2016-12-13 (×3): qty 1

## 2016-12-13 MED ORDER — ACETAMINOPHEN 325 MG PO TABS
650.0000 mg | ORAL_TABLET | Freq: Four times a day (QID) | ORAL | Status: DC | PRN
  Administered 2016-12-13 – 2016-12-18 (×16): 650 mg via ORAL
  Filled 2016-12-13 (×16): qty 2

## 2016-12-13 MED ORDER — CARBAMAZEPINE 200 MG PO TABS
200.0000 mg | ORAL_TABLET | Freq: Two times a day (BID) | ORAL | Status: DC
  Administered 2016-12-13 – 2016-12-18 (×10): 200 mg via ORAL
  Filled 2016-12-13 (×11): qty 1

## 2016-12-13 MED ORDER — GABAPENTIN 300 MG PO CAPS
300.0000 mg | ORAL_CAPSULE | Freq: Three times a day (TID) | ORAL | Status: DC
  Administered 2016-12-13 – 2016-12-17 (×12): 300 mg via ORAL
  Filled 2016-12-13 (×12): qty 1
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
## 2016-12-13 NOTE — Progress Notes (Signed)
Pt to nurse complaining of low back pain rated 7/10. Reviewed orders with patient, pt is requesting to take flexeril now. Per Dr. Einar Grad via telephone, give HS dose NOW. No other new orders. Will administer flexeril for back pain and continue to monitor.

## 2016-12-13 NOTE — Progress Notes (Signed)
Recreation Therapy Notes  INPATIENT RECREATION THERAPY ASSESSMENT  Patient Details Name: Carl Martinez MRN: [id number] DOB: [birth year] Today's Date: 12/13/2016  Patient Stressors:  Patient verbalized no stressors.  Coping Skills:   Isolate, Arguments, Avoidance, Exercise, Music, Sports, Other (Comment) (Fish, hunt, work)  Horticulturist, commercial: Anger, Communication, Concentration, Decision-Making, Expressing Yourself, Technical sales engineer, Stress Management, Trusting Others  Leisure Interests (2+):  Nature - Fishing, Therapist, music - Leisure centre manager Resources:  No  Community Resources:     Current Use:    If no, Barriers?:    Patient Strengths:  Eyes, smile  Patient Identified Areas of Improvement:  No  Current Recreation Participation:  Nothing  Patient Goal for Hospitalization:  To get better and go home  [place] of Residence:  [place] of Residence:  [place]   Current SI (including self-harm):  No  Current HI:  Yes  Consent to Intern Participation: N/A   Leonette Monarch, LRT/CTRS 12/13/2016, 4:41 PM

## 2016-12-13 NOTE — [facility] Suicide Risk Assessment (Signed)
[facility] Admission Suicide Risk Assessment   Nursing information obtained from:    Demographic factors:    Current Mental Status:    Loss Factors:    Historical Factors:    Risk Reduction Factors:     Total Time spent with patient: 1 hour Principal Problem: Schizophrenia spectrum disorder with psychotic disorder type not yet determined Diagnosis:   Patient Active Problem List   Diagnosis Date Noted  . Schizophrenia spectrum disorder with psychotic disorder type not yet determined [F29] 12/12/2016  . Homicidal ideation [R45.850] 12/12/2016  . Crohn's disease ([place]) [K50.90] 12/12/2016  . Dehydration [E86.0] 04/05/2016  . Sterile pyuria [N39.0] 04/05/2016  . Hematuria [R31.9] 04/05/2016  . Hypokalemia [E87.6] 04/05/2016  . Leukocytosis [D72.829] 04/05/2016  . Tobacco use disorder [F17.200] 04/05/2016  . AKI (acute kidney injury) ([place]) [N17.9] 04/03/2016   Subjective Data: hallucinations, homicidal ideation.  Continued Clinical Symptoms:  Alcohol Use Disorder Identification Test Final Score (AUDIT): 0 The "Alcohol Use Disorders Identification Test", Guidelines for Use in Primary Care, Second Edition.  World Pharmacologist [facility]). Score between 0-7:  no or low risk or alcohol related problems. Score between 8-15:  moderate risk of alcohol related problems. Score between 16-19:  high risk of alcohol related problems. Score 20 or above:  warrants further diagnostic evaluation for alcohol dependence and treatment.   CLINICAL FACTORS:   Depression:   Hopelessness Impulsivity Insomnia Currently Psychotic   Musculoskeletal: Strength & Muscle Tone: within normal limits Gait & Station: normal Patient leans: N/A  Psychiatric Specialty Exam: Physical Exam  Nursing note and vitals reviewed. Psychiatric: His speech is normal. His affect is labile. He is withdrawn and actively hallucinating. Thought content is paranoid and delusional. Cognition and memory are normal. He expresses  impulsivity. He exhibits a depressed mood. He expresses homicidal ideation. He expresses homicidal plans.    Review of Systems  Psychiatric/Behavioral: Positive for depression and hallucinations. The patient has insomnia.   All other systems reviewed and are negative.   Blood pressure 130/90, pulse 78, temperature 98 F (36.7 C), resp. rate 18, height 6' 1"  (1.854 m), weight 122.5 kg (270 lb), SpO2 100 %.Body mass index is 35.62 kg/m.  General Appearance: Casual  Eye Contact:  Good  Speech:  Clear and Coherent  Volume:  Normal  Mood:  Anxious, Depressed and Hopeless  Affect:  Blunt  Thought Process:  Goal Directed and Descriptions of Associations: Intact  Orientation:  Full (Time, Place, and Person)  Thought Content:  Delusions, Hallucinations: Auditory Command:  to kill his family. and Paranoid Ideation  Suicidal Thoughts:  No  Homicidal Thoughts:  Yes.  without intent/plan  Memory:  Immediate;   Fair Recent;   Fair Remote;   Fair  Judgement:  Poor  Insight:  Shallow  Psychomotor Activity:  Psychomotor Retardation  Concentration:  Concentration: Fair and Attention Span: Fair  Recall:  [employer] of Knowledge:  Fair  Language:  Fair  Akathisia:  No  Handed:  Right  AIMS (if indicated):     Assets:  Communication Skills Desire for Improvement Financial Resources/Insurance Housing Physical Health Resilience Social Support  ADL's:  Intact  Cognition:  WNL  Sleep:  Number of Hours: 3      COGNITIVE FEATURES THAT CONTRIBUTE TO RISK:  None    SUICIDE RISK:   Severe:  Frequent, intense, and enduring suicidal ideation, specific plan, no subjective intent, but some objective markers of intent (i.e., choice of lethal method), the method is accessible, some limited preparatory  behavior, evidence of impaired self-control, severe dysphoria/symptomatology, multiple risk factors present, and few if any protective factors, particularly a lack of social support.  PLAN OF CARE:  Hospital admission, medication management, discharge planning.  Carl Martinez is a 26 year old male with a history of untreated depression and psychosis admitted for auditory command hallucinations telling him to kill his family.  1. Homicidal ideation. The patient is able to contract for safety in the hospital.  2. Mood and psychosis. He was started on file for the voices he.  3. Anxiety. Vistaril is available.  4. Smoking. Nicotine patch is available.  5. Insomnia. Trazodone is available.   6. Metabolic syndrome monitoring. Lipid panel, TSH, hemoglobin A1c are pending.  7. EKG. Pending.  8. Disposition. He will be discharged to home. He will follow up with [facility].  I certify that inpatient services furnished can reasonably be expected to improve the patient's condition.   Orson Slick, MD 12/13/2016, 9:14 AM

## 2016-12-13 NOTE — Progress Notes (Signed)
Pt awake, alert, and up on unit today. Appears flat. Calm and cooperative. Continues to endorse passive HI/AH, but reports "I do not want to hurt anyone." Minimal interaction observed with staff/peers, unless directly approached. Attends group. No behavior issues noted. Pt was hesitant to take morning dose of medication (haldol) as ordered, but after speaking with doctor, he was compliant with taking medications. Did report tightness of jaw mid morning and cogentin was administered as ordered PRN. Denies SI. Support and encouragement provided. Medications administered as ordered with education. Safety maintained with every 15 minute checks. Will continue to monitor.
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
## 2016-12-13 NOTE — BHH Group Notes (Signed)
[place] LCSW Group Therapy   12/13/2016 1pm   Type of Therapy: Group Therapy   Participation Level: Patient invited but did not attend.    Glorious Peach, MSW, LCSWA 12/13/2016, 2:18PM

## 2016-12-13 NOTE — Progress Notes (Signed)
Recreation Therapy Notes  Date: 05.17.18 Time: 9:30 am Location: Craft Room  Group Topic: Leisure Education  Goal Area(s) Addresses:  Patient will identify things they are grateful for. Patient will identify how being grateful can influence decision making.  Behavioral Response: Intermittently Attentive  Intervention: Grateful Wheel  Activity: Patients were given an I Am Grateful For worksheet and were instructed to write things they were grateful for under each category.   Education: LRT educated patient on leisure.  Education Outcome: In group clarification offered  Clinical Observations/Feedback: Patient did not participate in group activity. Patient did not contribute to group discussion.  Leonette Monarch, LRT/CTRS 12/13/2016 10:11 AM

## 2016-12-13 NOTE — BHH Suicide Risk Assessment (Signed)
[place] INPATIENT:  Family/Significant Other Suicide Prevention Education  Suicide Prevention Education:  Education Completed;Carl Martinez(Aunt [phone]), has been identified by the patient as the family member/significant other with whom the patient will be residing, and identified as the person(s) who will aid the patient in the event of a mental health crisis (suicidal ideations/suicide attempt).  With written consent from the patient, the family member/significant other has been provided the following suicide prevention education, prior to the and/or following the discharge of the patient.  The suicide prevention education provided includes the following:  Suicide risk factors  Suicide prevention and interventions  National Suicide Hotline telephone number  [facility] assessment telephone number  [facility] Emergency Assistance [place] and/or Residential Mobile Crisis Unit telephone number  Request made of family/significant other to:  Remove weapons (e.g., guns, rifles, knives), all items previously/currently identified as safety concern.    Remove drugs/medications (over-the-counter, prescriptions, illicit drugs), all items previously/currently identified as a safety concern.  The family member/significant other verbalizes understanding of the suicide prevention education information provided.  The family member/significant other agrees to remove the items of safety concern listed above.  Carl Martinez, [place] 12/13/2016, 1:45 PM

## 2016-12-13 NOTE — Tx Team (Signed)
Initial Treatment Plan 12/13/2016 2:50 AM Carl Martinez    PATIENT STRESSORS: Financial difficulties Marital or family conflict Medication change or noncompliance   PATIENT STRENGTHS: Ability for insight Communication skills General fund of knowledge Motivation for treatment/growth   PATIENT IDENTIFIED PROBLEMS:  HI towards family  depression  Psychosis  "work on getting out of here"               DISCHARGE CRITERIA:  Ability to meet basic life and health needs Adequate post-discharge living arrangements Verbal commitment to aftercare and medication compliance  PRELIMINARY DISCHARGE PLAN: Attend aftercare/continuing care group Attend PHP/IOP Outpatient therapy  PATIENT/FAMILY INVOLVEMENT: This treatment plan has been presented to and reviewed with the patient, Carl Martinez.  The patient and family have been given the opportunity to ask questions and make suggestions.  Providence Crosby, RN 12/13/2016, 2:50 AM

## 2016-12-13 NOTE — Progress Notes (Signed)
Patient ID: Carl Martinez, male   DOB: Jun 30, 1991, 26 y.o.   MRN: [id number]  CSW called [facility] in [place] to schedule follow-up appointment for patient. CSW spoke with staff member [name]. Informed CSW that patient would have to call himself or after he is discharged to schedule his follow-up appointment. CSW explained that patient has limited phone hours on the unit and that patient signed release of information for [facility] so that a appointment could be coordinated for him before he is discharged.   Staff member then stated that an appointment could not be made unless the discharge date is already in place. Staff member [name] suggested that CSW call on the day patient is discharging to schedule his appointment. CSW then explained that patient needs a follow-up appointment within 7 days of his discharge which has been a barrier for patient's following up with [facility] in [place]. CSW was calling [facility] as soon as possible to ensure that patient has a follow-up appointment within 7 days.  Staff member than stated she was willing to make an exception this time but in the future the patient either needs to call themselves to schedule appointments and that the discharge date needs to be determined before appointment can be made. Staff [name] then attempted to verify patient's Medicaid insurance, stated patient's insurance was showing as invalid and that the appointment could not be made until patient's insurance could be verified.   CSW went to patient and inquired about his insurance. Patient informed CSW that his insurance is active. CSW verified patient's insurance with unit RN case manager, patient's insurance with Medicaid is still currently active. CSW will call [place] [facility] again with patient to try and schedule follow-up appointment.   Lachina Salsberry G. [place], [place] 12/13/2016 1:47 PM

## 2016-12-13 NOTE — Progress Notes (Signed)
Pt reporting lower back pain rated 9/10. States he usually takes hydrocodone every 6-8 hours PRN, flexeril at bedtime, and gabapentin 382m TID. Will inform MD. Safety maintained with every 15 minute checks. Will continue to monitor.

## 2016-12-13 NOTE — Progress Notes (Signed)
Patient is visible in the milieu. Alert and oriented. Pleasant and cooperative. Denies thoughts of self harm. Denies hallucinations. Was seen in group, interacting with peers appropriately. Has no concern so far. Patient presented to the medication room and took his medications voluntarily. No sign of discomfort noted. Was encouraged to talk to staff and express his concerns as needed. Therapeutic milieu promoted. Patient's safety maintained on the unit.

## 2016-12-13 NOTE — BHH Group Notes (Signed)
[place] Group Notes:  (Nursing/MHT/Case Management/Adjunct)  Date:  12/13/2016  Time:  4:08 PM  Type of Therapy:  Psychoeducational Skills  Participation Level:  Did Not Attend  Almon [facility] 12/13/2016, 4:08 PM

## 2016-12-13 NOTE — Plan of Care (Signed)
Problem: Coping: Goal: Ability to verbalize feelings will improve Outcome: Progressing Pt verbalizes feelings to staff. Minimal interaction noted with peers. Cooperative, compliant. Denies SI, but endorses passive HI and AH. States, "I dont' want to hurt anyone."
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
## 2016-12-13 NOTE — ED Notes (Signed)
Preparing patient for transfer to BMU.  Legrand Como, RN called received report.

## 2016-12-14 LAB — HEMOGLOBIN A1C
Hgb A1c MFr Bld: 5.1 % (ref 4.8–5.6)
Mean Plasma Glucose: 100 mg/dL

## 2016-12-14 MED ORDER — QUETIAPINE FUMARATE 25 MG PO TABS
25.0000 mg | ORAL_TABLET | Freq: Three times a day (TID) | ORAL | Status: DC
  Administered 2016-12-14 – 2016-12-17 (×9): 25 mg via ORAL
  Filled 2016-12-14 (×9): qty 1

## 2016-12-14 MED ORDER — QUETIAPINE FUMARATE 200 MG PO TABS
200.0000 mg | ORAL_TABLET | Freq: Every day | ORAL | Status: DC
  Administered 2016-12-14: 200 mg via ORAL
  Filled 2016-12-14: qty 1

## 2016-12-14 MED ORDER — PRAZOSIN HCL 2 MG PO CAPS
2.0000 mg | ORAL_CAPSULE | Freq: Two times a day (BID) | ORAL | Status: DC
  Administered 2016-12-14 – 2016-12-17 (×6): 2 mg via ORAL
  Filled 2016-12-14 (×6): qty 1

## 2016-12-14 MED ORDER — CYCLOBENZAPRINE HCL 10 MG PO TABS
5.0000 mg | ORAL_TABLET | Freq: Three times a day (TID) | ORAL | Status: DC
  Administered 2016-12-14 – 2016-12-17 (×9): 5 mg via ORAL
  Filled 2016-12-14 (×10): qty 1

## 2016-12-14 MED ORDER — PANTOPRAZOLE SODIUM 40 MG PO TBEC
40.0000 mg | DELAYED_RELEASE_TABLET | Freq: Every day | ORAL | Status: DC
  Administered 2016-12-15 – 2016-12-18 (×4): 40 mg via ORAL
  Filled 2016-12-14 (×4): qty 1

## 2016-12-14 MED ORDER — CYCLOBENZAPRINE HCL 10 MG PO TABS: 5.0000 mg | ORAL_TABLET | Freq: Three times a day (TID) | ORAL | Status: DC

## 2016-12-14 NOTE — Progress Notes (Signed)
Recreation Therapy Notes  Date: 05.18.18 Time: 1:00 pm Location: Craft Room  Group Topic: Social Skills  Goal Area(s) Addresses:  Patient will effectively work with peers towards shared goal. Patient will identify skill used to make activity successful. Patient will identify how skills used during activity can be used to reach post d/c goals.  Behavioral Response: Did not attend   Intervention: Life Boat  Activity: Patients were given a scenario that we were in [place] and we decided to take a boat and explore. Patients were given a list of 16 people (Jimmy Arna Medici, chef, ex-marine, etc) that were on the boat with [place]. The boat sprung a leak and is going down. Patients were told there are 2 boats. One is nice, faster, and bigger and the other is a raft. Patients were instructed to pick 8 people from the list to be in the boat with them and 8 people to go in the raft.  Education: LRT educated patients on healthy support systems.  Education Outcome: Patient did not attend group.  Clinical Observations/Feedback: Patient did not attend group.  Leonette Monarch, LRT/CTRS 12/14/2016 1:54 PM

## 2016-12-14 NOTE — Progress Notes (Signed)
Patient continues to complain of back pain. Md adjusted medications. Prns given with partial relief. Pt denies SI, HI, AVH. No negative behaviors. Appropriate with staff and peers. Med and group compliant. Visible in milieu. Eating meals well. Encouragement and support offered. Safety checks maintained. Medications given as prescribed.  Pt receptive and remains safe on unit with q 15 min checks.

## 2016-12-14 NOTE — Plan of Care (Signed)
Problem: [facility] Participation in Recreation Therapeutic Interventions Goal: STG-Other Recreation Therapy Goal (Specify) STG: Stress Management - Within 4 treatment sessions, patient will verbalize understanding of the stress management techniques in each of 2 treatment sessions to increase stress management skills.  Outcome: Progressing Treatment Session 1; Completed 1 out of 2: At approximately 4:00 pm, LRT met with patient in consultation room. LRT educated and provided patient with handouts on stress management techniques. Patient verbalized understanding. LRT encouraged patient to read over and practice the stress management techniques.  Leonette Monarch, LRT/CTRS 05.18.18 4:13 pm

## 2016-12-14 NOTE — Plan of Care (Signed)
Problem: [facility] Participation in Recreation Therapeutic Interventions Goal: STG-Patient will identify at least five coping skills for ** STG: Coping Skills - Within 4 treatment sessions, patient will verbalize at least 5 coping skills for anger in each of 2 treatment sessions to increase anger management skills.  Outcome: Progressing Treatment Session 1; Completed 1 out of 2: At approximately 3:05 pm, LRT met with patient in consultation room. Patient verbalized 5 coping skills for anger. Patient verbalized what triggers him to get angry, how his body responds to anger, and how he is going to remind himself to use his healthy coping skills. LRT provided suggestions as well.  Leonette Monarch, LRT/CTRS 05.18.18 4:11 pm

## 2016-12-14 NOTE — [facility] Group Notes (Signed)
[place] LCSW Group Therapy Note  Date/Time: 12/14/16, 0930  Type of Therapy and Topic:  Group Therapy:  Feelings around Relapse and Recovery  Participation Level:  Did Not Attend   Mood:  Description of Group:    Patients in this group will discuss emotions they experience before and after a relapse. They will process how experiencing these feelings, or avoidance of experiencing them, relates to having a relapse. Facilitator will guide patients to explore emotions they have related to recovery. Patients will be encouraged to process which emotions are more powerful. They will be guided to discuss the emotional reaction significant others in their lives may have to patients' relapse or recovery. Patients will be assisted in exploring ways to respond to the emotions of others without this contributing to a relapse.  Therapeutic Goals: 1. Patient will identify two or more emotions that lead to relapse for them:  2. Patient will identify two emotions that result when they relapse:  3. Patient will identify two emotions related to recovery:  4. Patient will demonstrate ability to communicate their needs through discussion and/or role plays.   Summary of Patient Progress:     Therapeutic Modalities:   Cognitive Behavioral Therapy Solution-Focused Therapy Assertiveness Training Relapse Prevention Therapy  Lurline Idol, LCSW

## 2016-12-14 NOTE — Plan of Care (Signed)
Problem: Safety: Goal: Ability to remain free from injury will improve Outcome: Progressing No injury. Safety precautions maintained

## 2016-12-14 NOTE — Plan of Care (Signed)
Problem: Safety: Goal: Ability to remain free from injury will improve Outcome: Progressing No injuries reported or observed

## 2016-12-14 NOTE — Progress Notes (Signed)
[facility] MD Progress Note  12/14/2016 2:23 PM Carl Martinez  MRN:  [id number]  Subjective:  Carl Martinez is a 26 year old male with no psychiatric history admitted for auditory command hallucinations to kill his family since the age of 25. He was started on Haldoland Tegretol for psychosis and mood stabilization.  12/14/2016. Carl Martinez reports some improvement. Command hallucinations are no more. He thinks he has multiple personalities. He feels restless and anxious, possibly from Haldol. He had terrible nightmares last night and complains of headche and back pain.  We discontinue Haldol and give Seroquel. Therevare no behavioral problems.  Per nursing: Carl Martinez is visible in the milieu. Alert and oriented. Pleasant and cooperative. Denies thoughts of self harm. Denies hallucinations. Was seen in group, interacting with peers appropriately. Has no concern so far. Carl Martinez presented to the medication room and took his medications voluntarily. No sign of discomfort noted. Was encouraged to talk to staff and express his concerns as needed. Therapeutic milieu promoted. Carl Martinez's safety maintained on the unit.   Principal Problem: Schizophrenia spectrum disorder with psychotic disorder type not yet determined Diagnosis:   Carl Martinez Active Problem List   Diagnosis Date Noted  . Schizophrenia spectrum disorder with psychotic disorder type not yet determined [F29] 12/12/2016  . Homicidal ideation [R45.850] 12/12/2016  . Crohn's disease ([place]) [K50.90] 12/12/2016  . Dehydration [E86.0] 04/05/2016  . Sterile pyuria [N39.0] 04/05/2016  . Hematuria [R31.9] 04/05/2016  . Hypokalemia [E87.6] 04/05/2016  . Leukocytosis [D72.829] 04/05/2016  . Tobacco use disorder [F17.200] 04/05/2016  . AKI (acute kidney injury) ([place]) [N17.9] 04/03/2016   Total Time spent with Carl Martinez: 30 minutes  Past Psychiatric History: hallucinations.  Past Medical History:  Past Medical History:  Diagnosis Date  . Crohn disease  [facility])     Past Surgical History:  Procedure Laterality Date  . CHOLECYSTECTOMY     Family History: History reviewed. No pertinent family history. Family Psychiatric  History: Depression and anxiety. Social History:  History  Alcohol Use No     History  Drug Use No    Social History   Social History  . Marital status: Legally Separated    Spouse name: N/A  . Number of children: N/A  . Years of education: N/A   Social History Main Topics  . Smoking status: Current Every Day Smoker    Packs/day: 1.50    Years: 10.00    Types: Cigarettes  . Smokeless tobacco: Never Used  . Alcohol use No  . Drug use: No  . Sexual activity: Yes    Birth control/ protection: None   Other Topics Concern  . None   Social History Narrative  . None   Additional Social History:                         Sleep: Fair  Appetite:  Fair  Current Medications: Current Facility-Administered Medications  Medication Dose Route Frequency Provider Last Rate Last Dose  . acetaminophen (TYLENOL) tablet 650 mg  650 mg Oral Q6H PRN Clapacs, Madie Reno, MD   650 mg at 12/14/16 0731  . alum & mag hydroxide-simeth (MAALOX/MYLANTA) 200-200-20 MG/5ML suspension 30 mL  30 mL Oral Q4H PRN Clapacs, John T, MD      . carbamazepine (TEGRETOL) tablet 200 mg  200 mg Oral BID AC & HS Danette Weinfeld B, MD   200 mg at 12/14/16 0731  . cyclobenzaprine (FLEXERIL) tablet 5 mg  5 mg Oral TID Kenyatta Keidel, Wardell Honour, MD  5 mg at 12/14/16 1252  . gabapentin (NEURONTIN) capsule 300 mg  300 mg Oral TID Joci Dress B, MD   300 mg at 12/14/16 1135  . hydrOXYzine (ATARAX/VISTARIL) tablet 25 mg  25 mg Oral TID PRN Clapacs, Madie Reno, MD   25 mg at 12/14/16 1315  . magnesium hydroxide (MILK OF MAGNESIA) suspension 30 mL  30 mL Oral Daily PRN Clapacs, John T, MD      . nicotine (NICODERM CQ - dosed in mg/24 hours) patch 21 mg  21 mg Transdermal Q0600 Clapacs, Madie Reno, MD   21 mg at 12/14/16 0731  . prazosin (MINIPRESS)  capsule 2 mg  2 mg Oral BID Adrienne Trombetta B, MD      . QUEtiapine (SEROQUEL) tablet 200 mg  200 mg Oral QHS Layden Caterino B, MD      . QUEtiapine (SEROQUEL) tablet 25 mg  25 mg Oral TID Deundre Thong B, MD   25 mg at 12/14/16 1252    Lab Results:  Results for orders placed or performed during the hospital encounter of 12/13/16 (from the past 48 hour(s))  Hemoglobin A1c     Status: None   Collection Time: 12/13/16 10:27 AM  Result Value Ref Range   Hgb A1c MFr Bld 5.1 4.8 - 5.6 %    Comment: (NOTE)         Pre-diabetes: 5.7 - 6.4         Diabetes: >6.4         Glycemic control for adults with diabetes: <7.0    Mean Plasma Glucose 100 mg/dL    Comment: (NOTE) Performed At: [facility] [place], [place] [id number] Lindon Romp MD [id number]   Lipid panel     Status: Abnormal   Collection Time: 12/13/16 10:27 AM  Result Value Ref Range   Cholesterol 224 (H) 0 - 200 mg/dL   Triglycerides 211 (H) <150 mg/dL   HDL 32 (L) >40 mg/dL   Total CHOL/HDL Ratio 7.0 RATIO   VLDL 42 (H) 0 - 40 mg/dL   LDL Cholesterol 150 (H) 0 - 99 mg/dL    Comment:        Total Cholesterol/HDL:CHD Risk Coronary Heart Disease Risk Table                     Men   Women  1/2 Average Risk   3.4   3.3  Average Risk       5.0   4.4  2 X Average Risk   9.6   7.1  3 X Average Risk  23.4   11.0        Use the calculated Carl Martinez Ratio above and the CHD Risk Table to determine the Carl Martinez's CHD Risk.        ATP III CLASSIFICATION (LDL):  <100     mg/dL   Optimal  100-129  mg/dL   Near or Above                    Optimal  130-159  mg/dL   Borderline  160-189  mg/dL   High  >190     mg/dL   Very High   TSH     Status: None   Collection Time: 12/13/16 10:27 AM  Result Value Ref Range   TSH 1.708 0.350 - 4.500 uIU/mL    Comment: Performed by a 3rd Generation assay with a functional sensitivity of <=0.01 uIU/mL.  Blood Alcohol level:  Lab Results   Component Value Date   ETH <5 17/61/6073    Metabolic Disorder Labs: Lab Results  Component Value Date   HGBA1C 5.1 12/13/2016   MPG 100 12/13/2016   No results found for: PROLACTIN Lab Results  Component Value Date   CHOL 224 (H) 12/13/2016   TRIG 211 (H) 12/13/2016   HDL 32 (L) 12/13/2016   CHOLHDL 7.0 12/13/2016   VLDL 42 (H) 12/13/2016   LDLCALC 150 (H) 12/13/2016    Physical Findings: AIMS:  , ,  ,  ,    CIWA:    COWS:     Musculoskeletal: Strength & Muscle Tone: within normal limits Gait & Station: normal Carl Martinez leans: N/A  Psychiatric Specialty Exam: Physical Exam  Nursing note and vitals reviewed. Psychiatric: His speech is normal. His mood appears anxious. He is withdrawn and actively hallucinating. Thought content is paranoid and delusional. Cognition and memory are normal. He expresses impulsivity. He exhibits a depressed mood. He expresses homicidal ideation. He expresses homicidal plans.    Review of Systems  Musculoskeletal: Positive for back pain.  Psychiatric/Behavioral: Positive for depression and hallucinations. The Carl Martinez is nervous/anxious.   All other systems reviewed and are negative.   Blood pressure 123/73, pulse 70, temperature 98 F (36.7 C), temperature source Oral, resp. rate 17, height 6' 1"  (1.854 m), weight 122.5 kg (270 lb), SpO2 99 %.Body mass index is 35.62 kg/m.  General Appearance: Casual  Eye Contact:  Good  Speech:  Clear and Coherent  Volume:  Normal  Mood:  Anxious, Depressed and Hopeless  Affect:  Blunt  Thought Process:  Goal Directed and Descriptions of Associations: Intact  Orientation:  Full (Time, Place, and Person)  Thought Content:  Delusions, Hallucinations: Auditory Command:  to kill his family and Paranoid Ideation  Suicidal Thoughts:  No  Homicidal Thoughts:  Yes.  with intent/plan  Memory:  Immediate;   Fair Recent;   Fair Remote;   Fair  Judgement:  Poor  Insight:  Lacking  Psychomotor Activity:   Psychomotor Retardation  Concentration:  Concentration: Fair and Attention Span: Fair  Recall:  [employer] of Knowledge:  Fair  Language:  Fair  Akathisia:  No  Handed:  Right  AIMS (if indicated):     Assets:  Communication Skills Desire for Improvement Financial Resources/Insurance Housing Intimacy Physical Health Resilience Social Support  ADL's:  Intact  Cognition:  WNL  Sleep:  Number of Hours: 5.45     Treatment Plan Summary: Daily contact with Carl Martinez to assess and evaluate symptoms and progress in treatment and Medication management   Carl Martinez is a 26 year old male with a history of untreated depression and psychosis admitted for auditory command hallucinations telling him to kill his family.  1. Homicidal ideation. The Carl Martinez is able to contract for safety in the hospital.  2. Mood and psychosis. I discontinued Haldol and started Seroquel 200 mg nightly and 25 mg tid for anxiety and psychosis. We continue Tegretol for mood stabilization.  3. Anxiety. Seroquel is now available. We increased Minipress for OCD to 2 mg bid.  4. Smoking. Nicotine patch is available.  5. Insomnia. Seroquel.     6. Metabolic syndrome monitoring. Lipid panel, TSH, and hemoglobin A1c are normal.  7. EKG. Normal sinus rhythm, QTC 403.  8. Disposition. He will be discharged to home. He will follow up with [facility].  Orson Slick, MD 12/14/2016, 2:23 PM

## 2016-12-14 NOTE — Plan of Care (Signed)
Problem: Pain Managment: Goal: General experience of comfort will improve Outcome: Progressing Pain monitored and medications given as scheduled

## 2016-12-14 NOTE — Tx Team (Signed)
Interdisciplinary Treatment and Diagnostic Plan Update  12/14/2016 Time of Session: 10:30am Carl Martinez MRN: [id number]  Principal Diagnosis: Schizophrenia spectrum disorder with psychotic disorder type not yet determined  Secondary Diagnoses: Principal Problem:   Schizophrenia spectrum disorder with psychotic disorder type not yet determined Active Problems:   Dehydration   Tobacco use disorder   Homicidal ideation   Current Medications:  Current Facility-Administered Medications  Medication Dose Route Frequency Provider Last Rate Last Dose  . acetaminophen (TYLENOL) tablet 650 mg  650 mg Oral Q6H PRN Clapacs, Madie Reno, MD   650 mg at 12/14/16 0731  . alum & mag hydroxide-simeth (MAALOX/MYLANTA) 200-200-20 MG/5ML suspension 30 mL  30 mL Oral Q4H PRN Clapacs, John T, MD      . carbamazepine (TEGRETOL) tablet 200 mg  200 mg Oral BID AC & HS Pucilowska, Jolanta B, MD   200 mg at 12/14/16 0731  . cyclobenzaprine (FLEXERIL) tablet 5 mg  5 mg Oral TID Pucilowska, Jolanta B, MD   5 mg at 12/14/16 1252  . gabapentin (NEURONTIN) capsule 300 mg  300 mg Oral TID Pucilowska, Jolanta B, MD   300 mg at 12/14/16 1135  . hydrOXYzine (ATARAX/VISTARIL) tablet 25 mg  25 mg Oral TID PRN Clapacs, Madie Reno, MD   25 mg at 12/14/16 1315  . magnesium hydroxide (MILK OF MAGNESIA) suspension 30 mL  30 mL Oral Daily PRN Clapacs, John T, MD      . nicotine (NICODERM CQ - dosed in mg/24 hours) patch 21 mg  21 mg Transdermal Q0600 Clapacs, Madie Reno, MD   21 mg at 12/14/16 0731  . prazosin (MINIPRESS) capsule 2 mg  2 mg Oral BID Pucilowska, Jolanta B, MD      . QUEtiapine (SEROQUEL) tablet 200 mg  200 mg Oral QHS Pucilowska, Jolanta B, MD      . QUEtiapine (SEROQUEL) tablet 25 mg  25 mg Oral TID Pucilowska, Jolanta B, MD   25 mg at 12/14/16 1252   PTA Medications: Prescriptions Prior to Admission  Medication Sig Dispense Refill Last Dose  . gabapentin (NEURONTIN) 100 MG capsule Take 100 mg by mouth 3 (three)  times daily.  0 12/12/2016 at Unknown time  . omeprazole (PRILOSEC) 20 MG capsule Take 20 mg by mouth daily.  11 Past Week  . traZODone (DESYREL) 50 MG tablet Take 50 mg by mouth daily.  11 Past Week  . HYDROcodone-acetaminophen (NORCO/VICODIN) 5-325 MG tablet Take 1-2 tablets by mouth every 4 (four) hours as needed for moderate pain. (Patient not taking: Reported on 12/12/2016) 30 tablet 0 Not Taking at Unknown time  . LORazepam (ATIVAN) 0.5 MG tablet Take 1 tablet (0.5 mg total) by mouth every 8 (eight) hours as needed for anxiety. (Patient not taking: Reported on 12/12/2016) 20 tablet 0 Not Taking at Unknown time  . nicotine (NICODERM CQ - DOSED IN MG/24 HOURS) 21 mg/24hr patch Place 1 patch (21 mg total) onto the skin daily. (Patient not taking: Reported on 12/12/2016) 28 patch 0 Not Taking at Unknown time  . nicotine polacrilex (NICORETTE) 2 MG gum Take 1 each (2 mg total) by mouth as needed for smoking cessation. (Patient not taking: Reported on 12/12/2016) 100 tablet 0 Not Taking at Unknown time  . ondansetron (ZOFRAN ODT) 4 MG disintegrating tablet Take 1 tablet (4 mg total) by mouth every 8 (eight) hours as needed for nausea or vomiting. (Patient not taking: Reported on 12/12/2016) 20 tablet 0 Not Taking at Unknown time  .  promethazine (PHENERGAN) 25 MG tablet Take 1 tablet (25 mg total) by mouth every 4 (four) hours as needed for nausea or vomiting. (Patient not taking: Reported on 12/12/2016) 30 tablet 1 Not Taking at Unknown time    Patient Stressors: Financial difficulties Marital or family conflict Medication change or noncompliance  Patient Strengths: Ability for insight Curator fund of knowledge Motivation for treatment/growth  Treatment Modalities: Medication Management, Group therapy, Case management,  1 to 1 session with clinician, Psychoeducation, Recreational therapy.   Physician Treatment Plan for Primary Diagnosis: Schizophrenia spectrum disorder with  psychotic disorder type not yet determined Long Term Goal(s): Improvement in symptoms so as ready for discharge NA   Short Term Goals: Ability to identify changes in lifestyle to reduce recurrence of condition will improve Ability to verbalize feelings will improve Ability to disclose and discuss suicidal ideas Ability to demonstrate self-control will improve Ability to identify and develop effective coping behaviors will improve Ability to identify triggers associated with substance abuse/mental health issues will improve NA  Medication Management: Evaluate patient's response, side effects, and tolerance of medication regimen.  Therapeutic Interventions: 1 to 1 sessions, Unit Group sessions and Medication administration.  Evaluation of Outcomes: Progressing  Physician Treatment Plan for Secondary Diagnosis: Principal Problem:   Schizophrenia spectrum disorder with psychotic disorder type not yet determined Active Problems:   Dehydration   Tobacco use disorder   Homicidal ideation  Long Term Goal(s): Improvement in symptoms so as ready for discharge NA   Short Term Goals: Ability to identify changes in lifestyle to reduce recurrence of condition will improve Ability to verbalize feelings will improve Ability to disclose and discuss suicidal ideas Ability to demonstrate self-control will improve Ability to identify and develop effective coping behaviors will improve Ability to identify triggers associated with substance abuse/mental health issues will improve NA     Medication Management: Evaluate patient's response, side effects, and tolerance of medication regimen.  Therapeutic Interventions: 1 to 1 sessions, Unit Group sessions and Medication administration.  Evaluation of Outcomes: Progressing   RN Treatment Plan for Primary Diagnosis: Schizophrenia spectrum disorder with psychotic disorder type not yet determined Long Term Goal(s): Knowledge of disease and therapeutic  regimen to maintain health will improve  Short Term Goals: Ability to verbalize feelings will improve, Ability to identify and develop effective coping behaviors will improve and Compliance with prescribed medications will improve  Medication Management: RN will administer medications as ordered by provider, will assess and evaluate patient's response and provide education to patient for prescribed medication. RN will report any adverse and/or side effects to prescribing provider.  Therapeutic Interventions: 1 on 1 counseling sessions, Psychoeducation, Medication administration, Evaluate responses to treatment, Monitor vital signs and CBGs as ordered, Perform/monitor CIWA, COWS, AIMS and Fall Risk screenings as ordered, Perform wound care treatments as ordered.  Evaluation of Outcomes: Progressing   LCSW Treatment Plan for Primary Diagnosis: Schizophrenia spectrum disorder with psychotic disorder type not yet determined Long Term Goal(s): Safe transition to appropriate next level of care at discharge, Engage patient in therapeutic group addressing interpersonal concerns.  Short Term Goals: Engage patient in aftercare planning with referrals and resources, Increase ability to appropriately verbalize feelings, Increase emotional regulation and Increase skills for wellness and recovery  Therapeutic Interventions: Assess for all discharge needs, 1 to 1 time with Social worker, Explore available resources and support systems, Assess for adequacy in community support network, Educate family and significant other(s) on suicide prevention, Complete Psychosocial Assessment, Interpersonal group therapy.  Evaluation of Outcomes: Progressing   Progress in Treatment: Attending groups: Yes. Participating in groups: Yes. Taking medication as prescribed: Yes. Toleration medication: Yes. Family/Significant other contact made: Yes, individual(s) contacted:  Aunt Patient understands diagnosis:  Yes. Discussing patient identified problems/goals with staff: Yes. Medical problems stabilized or resolved: Yes. Denies suicidal/homicidal ideation: Yes. Issues/concerns per patient self-inventory: No. Other: n/a  New problem(s) identified: None identified at this time.   New Short Term/Long Term Goal(s): Patient will identify new coping skills to manage his anger.   Discharge Plan or Barriers: Patient will discharge home and follow-up with outpatient services.   Reason for Continuation of Hospitalization: Anxiety Medication stabilization  Estimated Length of Stay: 3 to 5 days.   Attendees: Patient: Carl Martinez 12/14/2016 3:01 PM  Physician: Dr. Orson Slick, MD 12/14/2016 3:01 PM  Nursing: Polly Cobia, RN 12/14/2016 3:01 PM  RN Care Manager: 12/14/2016 3:01 PM  Social Worker: Lear Ng. Claybon Jabs MSW, [place] 12/14/2016 3:01 PM  Recreational Therapist: Leonette Monarch, LRT/CTRS 12/14/2016 3:01 PM  Other:  12/14/2016 3:01 PM  Other:  12/14/2016 3:01 PM  Other: 12/14/2016 3:01 PM    Scribe for Treatment Team: Jolaine Click, [place] 12/14/2016 3:01 PM

## 2016-12-15 DIAGNOSIS — R45850 Homicidal ideations: Secondary | ICD-10-CM

## 2016-12-15 MED ORDER — QUETIAPINE FUMARATE 200 MG PO TABS
300.0000 mg | ORAL_TABLET | Freq: Every day | ORAL | Status: DC
  Administered 2016-12-15 – 2016-12-16 (×2): 300 mg via ORAL
  Filled 2016-12-15 (×2): qty 1

## 2016-12-15 MED ORDER — CLONAZEPAM 0.5 MG PO TABS
1.0000 mg | ORAL_TABLET | ORAL | Status: DC | PRN
  Administered 2016-12-15: 1 mg via ORAL
  Filled 2016-12-15: qty 2

## 2016-12-15 MED ORDER — CLONAZEPAM 0.5 MG PO TABS
0.5000 mg | ORAL_TABLET | Freq: Two times a day (BID) | ORAL | Status: DC | PRN
  Administered 2016-12-15 – 2016-12-18 (×7): 0.5 mg via ORAL
  Filled 2016-12-15 (×7): qty 1

## 2016-12-15 NOTE — Progress Notes (Addendum)
[facility] MD Progress Note  12/15/2016 10:47 AM Carl Martinez  MRN:  [id number]  Subjective:  Carl Martinez is a 26 year old male with no psychiatric history admitted for auditory command hallucinations to kill his family since the age of 21. He was started on Haldoland Tegretol for psychosis and mood stabilization.  12/14/2016. Carl Martinez reports some improvement. Command hallucinations are no more. He thinks he has multiple personalities. He feels restless and anxious, possibly from Haldol. He had terrible nightmares last night and complains of headche and back pain.  We discontinue Haldol and give Seroquel. Therevare no behavioral problems.   12/15/16:The patient reports that he still having auditory hallucinations but he denies any intent to harm anyone on the unit. He says prior to admission, he was hearing voices of a man named "Ranald" telling him to kill his parents. He says the personality of Ranald has been talking to him for years. He states that Ranald in a demon. He also has some visual hallucinations that started last night of seeing spiders. He has been having panic attacks about 5-6 per day including chest pain and shortness of breath. He cannot identify specific triggers for the panic attacks. The patient did go to groups but does not feel like the breathing techniques have helped with his anxiety. He says he feels depressed because of the voices. He says he does not want his parents and he is glad he came to the hospital. So far, he is tolerating psychotropic medications fairly well without any physical adverse side effects. Appetite is fair. He slept over 6 hours last night per nursing. No new somatic complaints but he does struggle with chronic back pain.   Past psychiatric history. He has never been hospitalized, treated with medications, or seen a mental health professional. There were no suicide attempts. He was given 20 pills of Ativan by emergency room physician a while ago for  anxiety.   Family psychiatric history. Grandmother with depression, and aunt with anxiety and depression.   Social history. He lives with his mother, aunt, fianc and 4 children ages 74, 23, 16, and 2 months. His 24-year-old son lives with his mother but comes to visit every weekend. The patient works in Biomedical [employer]. Due to the weather he's been staying home quite a bit recently. He hopes that the job will pick up soon. He denies legal charges.   Principal Problem: Schizophrenia spectrum disorder with psychotic disorder type not yet determined Diagnosis:   Patient Active Problem List   Diagnosis Date Noted  . Schizophrenia spectrum disorder with psychotic disorder type not yet determined [F29] 12/12/2016    Priority: High  . Homicidal ideation [R45.850] 12/12/2016    Priority: High  . Crohn's disease ([place]) [K50.90] 12/12/2016  . Dehydration [E86.0] 04/05/2016  . Sterile pyuria [N39.0] 04/05/2016  . Hematuria [R31.9] 04/05/2016  . Hypokalemia [E87.6] 04/05/2016  . Leukocytosis [D72.829] 04/05/2016  . Tobacco use disorder [F17.200] 04/05/2016  . AKI (acute kidney injury) ([place]) [N17.9] 04/03/2016   Total Time spent with patient: 30 minutes  Past Psychiatric History: hallucinations.  Past Medical History:  Past Medical History:  Diagnosis Date  . Crohn disease [facility])     Past Surgical History:  Procedure Laterality Date  . CHOLECYSTECTOMY     Family History: History reviewed. No pertinent family history. Family Psychiatric  History: Depression and anxiety. Social History:  History  Alcohol Use No     History  Drug Use No    Social History   Social  History  . Marital status: Legally Separated    Spouse name: N/A  . Number of children: N/A  . Years of education: N/A   Social History Main Topics  . Smoking status: Current Every Day Smoker    Packs/day: 1.50    Years: 10.00    Types: Cigarettes  . Smokeless tobacco: Never Used  . Alcohol use No  . Drug use: No  .  Sexual activity: Yes    Birth control/ protection: None   Other Topics Concern  . None   Social History Narrative  . None        Sleep: Fair  Appetite:  Good  Current Medications: Current Facility-Administered Medications  Medication Dose Route Frequency Provider Last Rate Last Dose  . acetaminophen (TYLENOL) tablet 650 mg  650 mg Oral Q6H PRN Clapacs, Madie Reno, MD   650 mg at 12/15/16 1287  . alum & mag hydroxide-simeth (MAALOX/MYLANTA) 200-200-20 MG/5ML suspension 30 mL  30 mL Oral Q4H PRN Clapacs, John T, MD   30 mL at 12/14/16 1557  . carbamazepine (TEGRETOL) tablet 200 mg  200 mg Oral BID AC & HS Pucilowska, Jolanta B, MD   200 mg at 12/15/16 0801  . cyclobenzaprine (FLEXERIL) tablet 5 mg  5 mg Oral TID Pucilowska, Jolanta B, MD   5 mg at 12/15/16 0802  . gabapentin (NEURONTIN) capsule 300 mg  300 mg Oral TID Pucilowska, Jolanta B, MD   300 mg at 12/15/16 0801  . hydrOXYzine (ATARAX/VISTARIL) tablet 25 mg  25 mg Oral TID PRN Clapacs, Madie Reno, MD   25 mg at 12/15/16 0802  . magnesium hydroxide (MILK OF MAGNESIA) suspension 30 mL  30 mL Oral Daily PRN Clapacs, John T, MD      . nicotine (NICODERM CQ - dosed in mg/24 hours) patch 21 mg  21 mg Transdermal Q0600 Clapacs, Madie Reno, MD   21 mg at 12/14/16 0731  . pantoprazole (PROTONIX) EC tablet 40 mg  40 mg Oral Daily Pucilowska, Jolanta B, MD   40 mg at 12/15/16 0801  . prazosin (MINIPRESS) capsule 2 mg  2 mg Oral BID Pucilowska, Jolanta B, MD   2 mg at 12/15/16 0801  . QUEtiapine (SEROQUEL) tablet 25 mg  25 mg Oral TID Pucilowska, Jolanta B, MD   25 mg at 12/15/16 0802  . QUEtiapine (SEROQUEL) tablet 300 mg  300 mg Oral QHS Chauncey Mann, MD        Lab Results:  No results found for this or any previous visit (from the past 48 hour(s)).  Blood Alcohol level:  Lab Results  Component Value Date   ETH <5 86/76/7209    Metabolic Disorder Labs: Lab Results  Component Value Date   HGBA1C 5.1 12/13/2016   MPG 100 12/13/2016    No results found for: PROLACTIN Lab Results  Component Value Date   CHOL 224 (H) 12/13/2016   TRIG 211 (H) 12/13/2016   HDL 32 (L) 12/13/2016   CHOLHDL 7.0 12/13/2016   VLDL 42 (H) 12/13/2016   LDLCALC 150 (H) 12/13/2016     Musculoskeletal: Strength & Muscle Tone: within normal limits Gait & Station: normal Patient leans: N/A  Psychiatric Specialty Exam: Physical Exam  Nursing note and vitals reviewed. Psychiatric: His speech is normal. His mood appears anxious. He is withdrawn and actively hallucinating. Thought content is paranoid and delusional. Cognition and memory are normal. He expresses impulsivity. He exhibits a depressed mood. He expresses homicidal ideation. He expresses homicidal plans.  Review of Systems  Constitutional: Negative.  Negative for chills, diaphoresis, fever, malaise/fatigue and weight loss.  HENT: Negative.  Negative for congestion, ear pain, hearing loss, sore throat and tinnitus.   Eyes: Negative.  Negative for blurred vision, double vision and photophobia.  Respiratory: Negative.  Negative for cough, hemoptysis, sputum production and shortness of breath.   Cardiovascular: Negative for chest pain and palpitations.  Gastrointestinal: Negative.  Negative for abdominal pain, constipation, diarrhea, heartburn, nausea and vomiting.  Genitourinary: Negative.  Negative for dysuria, frequency and urgency.  Musculoskeletal: Positive for back pain. Negative for falls, myalgias and neck pain.       Chronic back pain  Skin: Negative.  Negative for itching and rash.  Neurological: Negative.  Negative for dizziness, tingling, tremors, sensory change, speech change, focal weakness, seizures and headaches.  Endo/Heme/Allergies: Negative for environmental allergies. Does not bruise/bleed easily.  Psychiatric/Behavioral: Positive for depression and hallucinations. The patient is nervous/anxious.   All other systems reviewed and are negative.   Blood pressure  119/67, pulse 70, temperature 97.7 F (36.5 C), temperature source Oral, resp. rate 18, height 6' 1"  (1.854 m), weight 122.5 kg (270 lb), SpO2 99 %.Body mass index is 35.62 kg/m.  General Appearance: Casual  Eye Contact:  Good  Speech:  Clear and Coherent  Volume:  Normal  Mood:  Anxious, Depressed and Hopeless  Affect:  Blunt  Thought Process:  Goal Directed and Descriptions of Associations: Intact  Orientation:  Full (Time, Place, and Person)  Thought Content:  Delusions, Hallucinations: Auditory Command:  to kill his family and Paranoid Ideation  Suicidal Thoughts:  No  Homicidal Thoughts:  He denied any intent to hurt anyone on the unit, just his parents  Memory:  Immediate;   Fair Recent;   Fair Remote;   Fair  Judgement:  Poor  Insight:  Lacking  Psychomotor Activity:  Psychomotor Retardation  Concentration:  Concentration: Fair and Attention Span: Fair  Recall:  [employer] of Knowledge:  Fair  Language:  Fair  Akathisia:  No  Handed:  Right  AIMS (if indicated):     Assets:  Communication Skills Desire for Improvement Financial Resources/Insurance Housing Intimacy Physical Health Resilience Social Support  ADL's:  Intact  Cognition:  WNL  Sleep:  Number of Hours: 6.3     Treatment Plan Summary: Daily contact with patient to assess and evaluate symptoms and progress in treatment and Medication management   Carl Martinez is a 26 year old male with a history of untreated depression and psychosis admitted for auditory command hallucinations telling him to kill his family.  1. Homicidal ideation. The patient is able to contract for safety in the hospital. The patient denies any intent to harm anyone in the hospital.   2. Mood and psychosis. He is now on Seroquel and will increase Seroquel to 300 mg nightly and 25 mgTID for anxiety and psychosis. We continue Tegretol 242m po BID for mood stabilization. Will need to check Tegretol level and LFTs prior to  discharge.  3. Anxiety. Seroquel is now available. He was started on Minipress for PTSD at 2 mg po bid.  4. Smoking. Nicotine patch is available.  5. Insomnia. He has Seroquel which was just increased.  6. Metabolic syndrome monitoring. Lipid panel, TSH, and hemoglobin A1c are normal.  7. EKG. Normal sinus rhythm, QTC 403.  8. Disposition. He will be discharged to home. He will follow up with [facility]. Will ensure that the patient is not actively psychotic at the time  of discharge.  Jay Schlichter, MD 12/15/2016, 10:47 AM

## 2016-12-15 NOTE — Plan of Care (Signed)
Problem: Coping: Goal: Ability to verbalize frustrations and anger appropriately will improve Outcome: Progressing Patient was assertive with writer during medication pass.

## 2016-12-15 NOTE — Progress Notes (Signed)
Patient reported feeling anxious at medication pass and he received Vistaril 25 mg at that time.  He returned later and asked about medication for pain.  He stated anxiety was "down a notch" but he needed "space" from the loud people in the dayroom.  He contracted for safety and denies SI/HI/AVH at this time.

## 2016-12-15 NOTE — Progress Notes (Signed)
Pt fixated on anxiety, back pain, and now asks "do any of these medications cause you to see things?" Pt went on to report he is "seeing spiders." Pt noted to pace the unit, appears restless, anxious, bothered. Continues to endorse HI without a plan or desire to harm anyone and AH. Encouraged pt to utilize coping skills, attend groups. He is unable to voice any certain triggers or any certain way to cope. Reports poor sleep last night with the help of sleep medicaiton, it is reported he slept 6 hours 30 minutes. Denies presence of nightmares when sleeping at night, but endorses having nightmares when sleeping during the day. Reports fair appetite, low energy, poor concentration. Rates depression 7/10, hopelessness 6/10, anxiety 10/10 (low 0-10 high). Denies SI. Goal today is "stay calm." Pt is medication compliant and requests PRN medications round the clock. Support and encouragement provided. Medications administered as ordered with education. Safety maintained with every 15 minute checks. Will continue to monitor.

## 2016-12-15 NOTE — Progress Notes (Signed)
Pt has complained of anxiety all day, appears to be more anxious today. Not currently time for another dose of hydroxyzine as ordered, so pt is requesting to inform the doctor. Dr. Nicolasa Ducking informed by this nurse. Awaiting new orders. Pt refuses to attend group at this time due to "I just dont do well around people." Safety maintained. Will continue to monitor.

## 2016-12-15 NOTE — Plan of Care (Signed)
Problem: Education: Goal: Will be free of psychotic symptoms Outcome: Not Progressing Pt continues to endorse HI/AVH. No behavior issues noted. Does not appear to be responding to internal stimuli. Support and encouragement provided. Pt is med compliant.

## 2016-12-15 NOTE — [facility] Group Notes (Signed)
[facility] LCSW Group Therapy  12/15/2016 3:14 PM  Type of Therapy:  Group Therapy  Participation Level:  Patient did not attend group. CSW invited patient to group.   Summary of Progress/Problems: Coping Skills: Patients defined and discussed healthy coping skills. Patients identified healthy coping skills they would like to try during hospitalization and after discharge. CSW offered insight to varying coping skills that may have been new to patients such as practicing mindfulness.  Carl Martinez G. [place], [place] 12/15/2016, 3:14 PM

## 2016-12-16 MED ORDER — OXYCODONE-ACETAMINOPHEN 5-325 MG PO TABS
1.0000 | ORAL_TABLET | Freq: Once | ORAL | Status: AC
  Administered 2016-12-16: 1 via ORAL
  Filled 2016-12-16: qty 1

## 2016-12-16 MED ORDER — OXYCODONE-ACETAMINOPHEN 5-325 MG PO TABS: 1.0000 | ORAL_TABLET | Freq: Once | ORAL | Status: DC

## 2016-12-16 NOTE — Plan of Care (Signed)
Problem: Coping: Goal: Ability to verbalize feelings will improve Outcome: Progressing Patient verbalizes feelings when encouraged and given time.

## 2016-12-16 NOTE — [facility] Group Notes (Signed)
[facility] Group Notes:  (Nursing/MHT/Case Management/Adjunct)  Date:  12/16/2016  Time:  5:55 AM  Type of Therapy:  Psychoeducational Skills  Participation Level:  Did Not Attend  Summary of Progress/Problems:  Reece Agar 12/16/2016, 5:55 AM

## 2016-12-16 NOTE — Progress Notes (Signed)
Pt complains of back pain this morning. Relates his back pain to "playing basketball" yesterday. Rates pain 9/10. Requests to take "the whole flexeril," as half (51m) is ordered. Pt relates anxiety to his pain. "If I wasn't in so much pain, I wouldn't be so anxious." Support and encouragement provided. Safety maintained with every 15 minute checks. Will continue to monitor.

## 2016-12-16 NOTE — Progress Notes (Addendum)
Patient ID: Carl Martinez, male   DOB: [birth year], 26 y.o.   MRN: [id number]  CSW meet with patient after group. Patient stated he has been having a hard time coping due to his pain. Patient stated it is hard to advocate for himself because he is used to isolating or withdrawing when he becomes anxious or angry. Patient shared that he feels his medications are not working because he is still experiencing nightmares and not sleeping well at night. Patient states his irritability and anger comes from hearing voices that he does not understand and severe pain in his back. CSW informed patient she would notify his MD of his concerns. Patient has no further questions or comments for CSW at this time.  [name] G. [place], [facility] 12/16/2016 4:50 PM

## 2016-12-16 NOTE — Progress Notes (Signed)
Patient was up walking in the halls at 0300.  When approached, he stated, "I knew the pain and nightmares was going to wake me".  He refused milk or supportive listening  and returned to bed

## 2016-12-16 NOTE — Plan of Care (Signed)
Problem: Coping: Goal: Ability to demonstrate self-control will improve Outcome: Progressing Pt somewhat needy on the unit, med seeking behavior noted, but appropriate with interaction with staff/peers. No behavior issues noted.

## 2016-12-16 NOTE — Progress Notes (Signed)
Pt continues to be fixated on back pain, anxiety. Denies auditory or visual hallucinations, but is noted to report "I'm ready to go home, so I'm not telling anybody if I do have hallucinations." Pt does not appear to be responding to internal stimuli. Denies SI/HI as well, denying an thoughts of harming his family. Continues to appear restless, anxious, bothered. No behavior issues noted. Reported poor sleep last night with nightmares. Attended group today. Appropriately interacts with staff/peers. Pt is a bit needy at times with multiple medication and nursing requests. Pt is medication compliant and displays medication seeking behaviors. Support and encouragement provided. Medications administered as ordered with education. Safety maintained with every 15 minute checks. Will continue to monitor.

## 2016-12-16 NOTE — Progress Notes (Signed)
Pt to nurse for evening medications. Appears irritable and demands the doctor be called due to "she told me if the percocet worked for my pain, she would write an order for it! The social worker told me that my anxiety is related to my pain! I am not going to lay up in here and hurt!" Pt needed redirection multiple times during medication administration regarding his requests for more medication and the fact that doctor would need to be called for an order for medication. Pt somewhat aggressive with his approach. Dr. Nicolasa Ducking informed via telephone and ordered another once dose of percocet now. Will administer. Safety maintained. Will continue to monitor.

## 2016-12-16 NOTE — Progress Notes (Signed)
Patient was observed with family in the visitors room and appeared relaxed and happy.  He spent most of this evening between the dayroom and peer group and coming to Probation officer for medications.  He did a good job of discussing his medications and assessing his pain.  He could identify that over doing activity may contribute to increased pain in the evening. He reported anxiety as back pain interfered with his ability to relax in the evening.  He requested that writer call the doctor "to let her know that pain is worse".  Dr Nicolasa Ducking was notified at 2230.  Order obtained for Klonipin 1 mg at 2255.

## 2016-12-16 NOTE — [facility] Group Notes (Signed)
[place] LCSW Group Therapy  12/16/2016 3:42 PM  Type of Therapy:  Group Therapy  Participation Level:  Active  Participation Quality:  Attentive and Sharing  Affect:  Anxious, Appropriate and Irritable  Cognitive:  Alert  Insight:  Limited  Engagement in Therapy:  Improving  Modes of Intervention:  Clarification, Discussion, Education, Limit-setting, Problem-solving, Reality Testing, Socialization and Support  Summary of Progress/Problems: Stages of Grief: Group facilitator defined grief and the stages of the grieving process. Facilitator offered support to patients and allowed patients to share openly about their experiences with grief. Patients explored how their individual experiences with grief have impacted their wellness and recovery. Each patient was challenged to recognize their obstacles in finding acceptance after experiencing a loss. Facilitator processed with patients new coping skills to overcome their challenges with grief. Group members received and gave support to group members.   Oralee Rapaport G. [place], [place] 12/16/2016, 3:42 PM

## 2016-12-16 NOTE — Progress Notes (Addendum)
[facility] MD Progress Note  12/16/2016 9:05 AM [name]  MRN:  [id number]  Subjective:  Carl Martinez is a 26 year old male with no psychiatric history admitted for auditory command hallucinations to kill his family since the age of 24. He was started on Haldoland Tegretol for psychosis and mood stabilization.  12/14/2016. Mr. Cantave reports some improvement. Command hallucinations are no more. He thinks he has multiple personalities. He feels restless and anxious, possibly from Haldol. He had terrible nightmares last night and complains of headche and back pain.  We discontinue Haldol and give Seroquel. Therevare no behavioral problems.   12/15/16:The patient reports that he still having auditory hallucinations but he denies any intent to harm anyone on the unit. He says prior to admission, he was hearing voices of a man named "Carl Martinez" telling him to kill his parents. He says the personality of Carl Martinez has been talking to him for years. He states that Carl Martinez in a demon. He also has some visual hallucinations that started last night of seeing spiders. He has been having panic attacks about 5-6 per day including chest pain and shortness of breath. He cannot identify specific triggers for the panic attacks. The patient did go to groups but does not feel like the breathing techniques have helped with his anxiety. He says he feels depressed because of the voices. He says he does not want his parents and he is glad he came to the hospital. So far, he is tolerating psychotropic medications fairly well without any physical adverse side effects. Appetite is fair. He slept over 6 hours last night per nursing. No new somatic complaints but he does struggle with chronic back pain.   12/16/16: The patient reports that he is feeling a little better this morning. He was up pacing last night before going to bed and has difficulty with insomnia secondary to back pain. He has not had any auditory hallucinations this  morning and says yesterday was the last time he heard the voice of Ronadli. He sometimes feels like he has nightmares because the voice of Ranaldi tortures him in the night. He says he is no longer having command auditory hallucinations to kill other people. He does still have some visual hallucinations of seeing spiders. He denies any current active or passive suicidal thoughts. The patient denies any problems with his appetite and has been eating fairly well. He says even made a few friends in group yesterday and has been more social on the unit. He says he is making a conscious effort to interact with others. His wife and mother visited yesterday. Vital signs have been stable. Per nursing he slept 5.45 hours. He is complaining of back pain and says he cannot take any NSAIDs secondary to Crohn's disease.Marland Kitchen   Past psychiatric history. He has never been hospitalized, treated with medications, or seen a mental health professional. There were no suicide attempts. He was given 20 pills of Ativan by emergency room physician a while ago for anxiety.   Family psychiatric history. Grandmother with depression, and aunt with anxiety and depression.   Social history. He lives with his mother, aunt, fianc and 4 children ages 32, 75, 51, and 44 months. His 62-year-old son lives with his mother but comes to visit every weekend. The patient works in Biomedical [employer]. Due to the weather he's been staying home quite a bit recently. He hopes that the job will pick up soon. He denies legal charges.   Principal Problem: Schizophrenia spectrum disorder with psychotic  disorder type not yet determined Diagnosis:   Patient Active Problem List   Diagnosis Date Noted  . Schizophrenia spectrum disorder with psychotic disorder type not yet determined [F29] 12/12/2016    Priority: High  . Homicidal ideation [R45.850] 12/12/2016    Priority: High  . Crohn's disease ([place]) [K50.90] 12/12/2016  . Dehydration [E86.0] 04/05/2016  . Sterile  pyuria [N39.0] 04/05/2016  . Hematuria [R31.9] 04/05/2016  . Hypokalemia [E87.6] 04/05/2016  . Leukocytosis [D72.829] 04/05/2016  . Tobacco use disorder [F17.200] 04/05/2016  . AKI (acute kidney injury) ([place]) [N17.9] 04/03/2016   Total Time spent with patient: 30 minutes  Past Psychiatric History: hallucinations.  Past Medical History:  Past Medical History:  Diagnosis Date  . Crohn disease [facility])     Past Surgical History:  Procedure Laterality Date  . CHOLECYSTECTOMY     Family History: History reviewed. No pertinent family history. Family Psychiatric  History: Depression and anxiety. Social History:  History  Alcohol Use No     History  Drug Use No    Social History   Social History  . Marital status: Legally Separated    Spouse name: N/A  . Number of children: N/A  . Years of education: N/A   Social History Main Topics  . Smoking status: Current Every Day Smoker    Packs/day: 1.50    Years: 10.00    Types: Cigarettes  . Smokeless tobacco: Never Used  . Alcohol use No  . Drug use: No  . Sexual activity: Yes    Birth control/ protection: None   Other Topics Concern  . None   Social History Narrative  . None        Sleep: Fair  Appetite:  Good  Current Medications: Current Facility-Administered Medications  Medication Dose Route Frequency Provider Last Rate Last Dose  . acetaminophen (TYLENOL) tablet 650 mg  650 mg Oral Q6H PRN Clapacs, Madie Reno, MD   650 mg at 12/16/16 0631  . alum & mag hydroxide-simeth (MAALOX/MYLANTA) 200-200-20 MG/5ML suspension 30 mL  30 mL Oral Q4H PRN Clapacs, John T, MD   30 mL at 12/15/16 1215  . carbamazepine (TEGRETOL) tablet 200 mg  200 mg Oral BID AC & HS Pucilowska, Jolanta B, MD   200 mg at 12/16/16 0820  . clonazePAM (KLONOPIN) tablet 0.5 mg  0.5 mg Oral BID PRN Chauncey Mann, MD   0.5 mg at 12/16/16 0820  . cyclobenzaprine (FLEXERIL) tablet 5 mg  5 mg Oral TID Pucilowska, Jolanta B, MD   5 mg at 12/16/16 0820  .  gabapentin (NEURONTIN) capsule 300 mg  300 mg Oral TID Pucilowska, Jolanta B, MD   300 mg at 12/16/16 4696  . hydrOXYzine (ATARAX/VISTARIL) tablet 25 mg  25 mg Oral TID PRN Clapacs, Madie Reno, MD   25 mg at 12/15/16 2222  . magnesium hydroxide (MILK OF MAGNESIA) suspension 30 mL  30 mL Oral Daily PRN Clapacs, John T, MD      . nicotine (NICODERM CQ - dosed in mg/24 hours) patch 21 mg  21 mg Transdermal Q0600 Clapacs, John T, MD   21 mg at 12/16/16 0630  . oxyCODONE-acetaminophen (PERCOCET/ROXICET) 5-325 MG per tablet 1 tablet  1 tablet Oral Once Chauncey Mann, MD      . pantoprazole (PROTONIX) EC tablet 40 mg  40 mg Oral Daily Pucilowska, Jolanta B, MD   40 mg at 12/16/16 0821  . prazosin (MINIPRESS) capsule 2 mg  2 mg Oral BID Pucilowska, Jolanta  B, MD   2 mg at 12/16/16 0821  . QUEtiapine (SEROQUEL) tablet 25 mg  25 mg Oral TID Pucilowska, Jolanta B, MD   25 mg at 12/16/16 0820  . QUEtiapine (SEROQUEL) tablet 300 mg  300 mg Oral QHS Chauncey Mann, MD   300 mg at 12/15/16 2129    Lab Results:  No results found for this or any previous visit (from the past 48 hour(s)).  Blood Alcohol level:  Lab Results  Component Value Date   ETH <5 63/87/5643    Metabolic Disorder Labs: Lab Results  Component Value Date   HGBA1C 5.1 12/13/2016   MPG 100 12/13/2016   No results found for: PROLACTIN Lab Results  Component Value Date   CHOL 224 (H) 12/13/2016   TRIG 211 (H) 12/13/2016   HDL 32 (L) 12/13/2016   CHOLHDL 7.0 12/13/2016   VLDL 42 (H) 12/13/2016   LDLCALC 150 (H) 12/13/2016     Musculoskeletal: Strength & Muscle Tone: within normal limits Gait & Station: normal Patient leans: N/A  Psychiatric Specialty Exam: Physical Exam  Nursing note and vitals reviewed. Psychiatric: His speech is normal. His mood appears anxious. He is withdrawn and actively hallucinating. Thought content is paranoid and delusional. Cognition and memory are normal. He expresses impulsivity. He exhibits a  depressed mood. He expresses homicidal ideation. He expresses homicidal plans.    Review of Systems  Constitutional: Negative.  Negative for chills, diaphoresis, fever, malaise/fatigue and weight loss.  HENT: Negative.  Negative for congestion, ear pain, hearing loss, sore throat and tinnitus.   Eyes: Negative.  Negative for blurred vision, double vision and photophobia.  Respiratory: Negative.  Negative for cough, hemoptysis, sputum production and shortness of breath.   Cardiovascular: Negative for chest pain and palpitations.  Gastrointestinal: Negative.  Negative for abdominal pain, constipation, diarrhea, heartburn, nausea and vomiting.  Genitourinary: Negative.  Negative for dysuria, frequency and urgency.  Musculoskeletal: Positive for back pain. Negative for falls, myalgias and neck pain.       Chronic back pain  Skin: Negative.  Negative for itching and rash.  Neurological: Negative.  Negative for dizziness, tingling, tremors, sensory change, speech change, focal weakness, seizures and headaches.  Endo/Heme/Allergies: Negative for environmental allergies. Does not bruise/bleed easily.  Psychiatric/Behavioral: Positive for depression and hallucinations. The patient is nervous/anxious.   All other systems reviewed and are negative.   Blood pressure 133/66, pulse 78, temperature 98 F (36.7 C), temperature source Oral, resp. rate 18, height 6' 1"  (1.854 m), weight 122.5 kg (270 lb), SpO2 99 %.Body mass index is 35.62 kg/m.  General Appearance: Casual  Eye Contact:  Good  Speech:  Clear and Coherent  Volume:  Normal  Mood:  Anxious, Depressed and Hopeless  Affect:  Blunt  Thought Process:  Goal Directed and Descriptions of Associations: Intact  Orientation:  Full (Time, Place, and Person)  Thought Content:  Last had auditory hallucinations yesterday. He is still having visual hallucinations and paranoid delusions.  Suicidal Thoughts:  No  Homicidal Thoughts:  He denied any intent  to hurt anyone on the unit, just his parents  Memory:  Immediate;   Fair Recent;   Fair Remote;   Fair  Judgement:  Poor  Insight:  Lacking  Psychomotor Activity:  Psychomotor Retardation  Concentration:  Concentration: Fair and Attention Span: Fair  Recall:  [employer] of Knowledge:  Fair  Language:  Fair  Akathisia:  No  Handed:  Right  AIMS (if indicated):  Assets:  Communication Skills Desire for Improvement Financial Resources/Insurance Housing Intimacy Physical Health Resilience Social Support  ADL's:  Intact  Cognition:  WNL  Sleep:  Number of Hours: 5.45     Treatment Plan Summary: Daily contact with patient to assess and evaluate symptoms and progress in treatment and Medication management   Mr. Milhouse is a 26 year old male with a history of untreated depression and psychosis admitted for auditory command hallucinations telling him to kill his family.  1. Homicidal ideation. The patient is able to contract for safety in the hospital. The patient denies any intent to harm anyone in the hospital.   2. Mood and psychosis. He is now on Seroquel and dose was increased to 300 mg nightly and 25 mgTID for anxiety and psychosis. We continue Tegretol 285m po BID for mood stabilization. Will need to check Tegretol level and LFTs prior to discharge.  3. Anxiety. Seroquel is now available. He was started on Minipress for PTSD at 2 mg po bid.  4. Back Pain: The patient has chronic back pain. He was not restarted on narcotics and cannot take NSAIDs due to Chrons Disease. He was given one time dose of Percocet today. [place]  4. Smoking. Nicotine patch is available.  5. Insomnia. He has Seroquel which was just increased.  6. Metabolic syndrome monitoring. Lipid panel, TSH, and hemoglobin A1c are normal.  7. EKG. Normal sinus rhythm, QTC 403.  8. Disposition. He will be discharged to home. He will follow up with [facility]. Will  ensure that the patient is not actively psychotic at the time of discharge.  KJay Schlichter MD 12/16/2016, 9:05 AM

## 2016-12-17 MED ORDER — QUETIAPINE FUMARATE 200 MG PO TABS
600.0000 mg | ORAL_TABLET | Freq: Every day | ORAL | Status: DC
  Administered 2016-12-17: 600 mg via ORAL
  Filled 2016-12-17: qty 3

## 2016-12-17 MED ORDER — CYCLOBENZAPRINE HCL 10 MG PO TABS
10.0000 mg | ORAL_TABLET | Freq: Three times a day (TID) | ORAL | Status: DC
  Administered 2016-12-17 – 2016-12-18 (×4): 10 mg via ORAL
  Filled 2016-12-17 (×4): qty 1

## 2016-12-17 MED ORDER — GABAPENTIN 300 MG PO CAPS
600.0000 mg | ORAL_CAPSULE | Freq: Three times a day (TID) | ORAL | Status: DC
  Administered 2016-12-17 – 2016-12-18 (×4): 600 mg via ORAL
  Filled 2016-12-17 (×4): qty 2

## 2016-12-17 MED ORDER — QUETIAPINE FUMARATE 25 MG PO TABS
50.0000 mg | ORAL_TABLET | Freq: Three times a day (TID) | ORAL | Status: DC
  Administered 2016-12-17 – 2016-12-18 (×4): 50 mg via ORAL
  Filled 2016-12-17 (×4): qty 2

## 2016-12-17 MED ORDER — PRAZOSIN HCL 2 MG PO CAPS: 4.0000 mg | ORAL_CAPSULE | Freq: Every day | ORAL | Status: DC

## 2016-12-17 NOTE — Progress Notes (Signed)
Pt social with select peers in the evening.  Requesting prn's when available and focused on his pain and need for pain medications.  Denies current active S/I, H/I or hallucinations.  Slept about 6 hours while monitored on 15 minute safety checks.

## 2016-12-17 NOTE — Progress Notes (Signed)
Pt med seeking, requests all medications round the clock and usually requests PRNs early. Pt becomes very irritable with this nurse regarding medications on each interaction, speaking to self in hallway about "I can't get my meds until 0945. That's just ridiculous...." Pt focused on constant medication changes, requesting doctor to increase doses then asking nurse if he can only take half the dose at administration time. Pt overheard speaking with other patinets about his medications and desired changes. Complains constantly of lower back pain, bilateral leg cramps and anxiety. Functions appropriately on the unit otherwise, appropriately interacting/socializing with peers. Irritable, flat. Denies SI/HI/AVH. Support and encouragement provided. Medications administered as ordered with education. Safety maintained with every 15 minute checks. Will continue to monitor.

## 2016-12-17 NOTE — [facility] Group Notes (Signed)
[facility] Group Notes:  (Nursing/MHT/Case Management/Adjunct)  Date:  12/17/2016  Time:  5:31 PM  Type of Therapy:  Psychoeducational Skills  Participation Level:  Active  Participation Quality:  Appropriate, Attentive, Sharing and Supportive  Affect:  Appropriate  Cognitive:  Alert and Appropriate  Insight:  Appropriate  Engagement in Group:  Engaged and Supportive  Modes of Intervention:  Discussion, Education, Exploration and Support  Summary of Progress/Problems:  Almon Hercules [facility] 12/17/2016, 5:31 PM

## 2016-12-17 NOTE — Progress Notes (Signed)
[facility] MD Progress Note  12/17/2016 12:22 PM DETRAVION TESTER  MRN:  [id number]  Subjective:  Mr. Rack is a 26 year old male with no psychiatric history admitted for auditory command hallucinations to kill his family since the age of 42. He was started on Haldoland Tegretol for psychosis and mood stabilization.  12/14/2016. Mr. Montanye reports some improvement. Command hallucinations are no more. He thinks he has multiple personalities. He feels restless and anxious, possibly from Haldol. He had terrible nightmares last night and complains of headche and back pain.  We discontinue Haldol and give Seroquel. Therevare no behavioral problems.  12/15/16:The patient reports that he still having auditory hallucinations but he denies any intent to harm anyone on the unit. He says prior to admission, he was hearing voices of a man named "Ranald" telling him to kill his parents. He says the personality of Ranald has been talking to him for years. He states that Ranald in a demon. He also has some visual hallucinations that started last night of seeing spiders. He has been having panic attacks about 5-6 per day including chest pain and shortness of breath. He cannot identify specific triggers for the panic attacks. The patient did go to groups but does not feel like the breathing techniques have helped with his anxiety. He says he feels depressed because of the voices. He says he does not want his parents and he is glad he came to the hospital. So far, he is tolerating psychotropic medications fairly well without any physical adverse side effects. Appetite is fair. He slept over 6 hours last night per nursing. No new somatic complaints but he does struggle with chronic back pain.   12/16/16: The patient reports that he is feeling a little better this morning. He was up pacing last night before going to bed and has difficulty with insomnia secondary to back pain. He has not had any auditory hallucinations this  morning and says yesterday was the last time he heard the voice of Ronadli. He sometimes feels like he has nightmares because the voice of Ranaldi tortures him in the night. He says he is no longer having command auditory hallucinations to kill other people. He does still have some visual hallucinations of seeing spiders. He denies any current active or passive suicidal thoughts. The patient denies any problems with his appetite and has been eating fairly well. He says even made a few friends in group yesterday and has been more social on the unit. He says he is making a conscious effort to interact with others. His wife and mother visited yesterday. Vital signs have been stable. Per nursing he slept 5.45 hours. He is complaining of back pain and says he cannot take any NSAIDs secondary to Crohn's disease  12/17/2016. Mr. Zappone is very somatic today. Complains of pain and muscle spasms. He insists that he is being prescribe narcotics in the community. He did have several prescriptions from his PCP for 10-20 tabs of 5 mg hydrocodone, last one at the end of April. He also complains of insomnia, nightmares and incapacitating anxiety. He was already given Clonazepam from Dr. Nicolasa Ducking along with single doses of Percocets.  Per nursing: Pt to nurse for evening medications. Appears irritable and demands the doctor be called due to "she told me if the percocet worked for my pain, she would write an order for it! The social worker told me that my anxiety is related to my pain! I am not going to lay up in here  and hurt!" Pt needed redirection multiple times during medication administration regarding his requests for more medication and the fact that doctor would need to be called for an order for medication. Pt somewhat aggressive with his approach. Dr. Nicolasa Ducking informed via telephone and ordered another once dose of percocet now. Will administer. Safety maintained. Will continue to monitor.   Principal Problem:  Schizophrenia spectrum disorder with psychotic disorder type not yet determined Diagnosis:   Patient Active Problem List   Diagnosis Date Noted  . Schizophrenia spectrum disorder with psychotic disorder type not yet determined [F29] 12/12/2016  . Homicidal ideation [R45.850] 12/12/2016  . Crohn's disease ([place]) [K50.90] 12/12/2016  . Dehydration [E86.0] 04/05/2016  . Sterile pyuria [N39.0] 04/05/2016  . Hematuria [R31.9] 04/05/2016  . Hypokalemia [E87.6] 04/05/2016  . Leukocytosis [D72.829] 04/05/2016  . Tobacco use disorder [F17.200] 04/05/2016  . AKI (acute kidney injury) ([place]) [N17.9] 04/03/2016   Total Time spent with patient: 30 minutes  Past Psychiatric History: hallucinations.  Past Medical History:  Past Medical History:  Diagnosis Date  . Crohn disease [facility])     Past Surgical History:  Procedure Laterality Date  . CHOLECYSTECTOMY     Family History: History reviewed. No pertinent family history. Family Psychiatric  History: Depression and anxiety. Social History:  History  Alcohol Use No     History  Drug Use No    Social History   Social History  . Marital status: Legally Separated    Spouse name: N/A  . Number of children: N/A  . Years of education: N/A   Social History Main Topics  . Smoking status: Current Every Day Smoker    Packs/day: 1.50    Years: 10.00    Types: Cigarettes  . Smokeless tobacco: Never Used  . Alcohol use No  . Drug use: No  . Sexual activity: Yes    Birth control/ protection: None   Other Topics Concern  . None   Social History Narrative  . None        Sleep: Fair  Appetite:  Good  Current Medications: Current Facility-Administered Medications  Medication Dose Route Frequency Provider Last Rate Last Dose  . acetaminophen (TYLENOL) tablet 650 mg  650 mg Oral Q6H PRN Clapacs, Madie Reno, MD   650 mg at 12/17/16 0700  . alum & mag hydroxide-simeth (MAALOX/MYLANTA) 200-200-20 MG/5ML suspension 30 mL  30 mL Oral Q4H PRN  Clapacs, John T, MD   30 mL at 12/15/16 1215  . carbamazepine (TEGRETOL) tablet 200 mg  200 mg Oral BID AC & HS Donisha Hoch B, MD   200 mg at 12/17/16 0813  . clonazePAM (KLONOPIN) tablet 0.5 mg  0.5 mg Oral BID PRN Chauncey Mann, MD   0.5 mg at 12/17/16 1032  . cyclobenzaprine (FLEXERIL) tablet 10 mg  10 mg Oral TID Maryam Feely B, MD      . gabapentin (NEURONTIN) capsule 600 mg  600 mg Oral TID Silas Muff B, MD      . hydrOXYzine (ATARAX/VISTARIL) tablet 25 mg  25 mg Oral TID PRN Clapacs, Madie Reno, MD   25 mg at 12/17/16 0700  . magnesium hydroxide (MILK OF MAGNESIA) suspension 30 mL  30 mL Oral Daily PRN Clapacs, John T, MD      . nicotine (NICODERM CQ - dosed in mg/24 hours) patch 21 mg  21 mg Transdermal Q0600 Clapacs, Madie Reno, MD   21 mg at 12/17/16 3762  . pantoprazole (PROTONIX) EC tablet 40 mg  40  mg Oral Daily Merrily Tegeler B, MD   40 mg at 12/17/16 0813  . [START ON 12/18/2016] prazosin (MINIPRESS) capsule 4 mg  4 mg Oral QHS Holly Iannaccone B, MD      . QUEtiapine (SEROQUEL) tablet 50 mg  50 mg Oral TID Ronald Londo B, MD      . QUEtiapine (SEROQUEL) tablet 600 mg  600 mg Oral QHS Wing Gfeller B, MD        Lab Results:  No results found for this or any previous visit (from the past 48 hour(s)).  Blood Alcohol level:  Lab Results  Component Value Date   ETH <5 73/53/2992    Metabolic Disorder Labs: Lab Results  Component Value Date   HGBA1C 5.1 12/13/2016   MPG 100 12/13/2016   No results found for: PROLACTIN Lab Results  Component Value Date   CHOL 224 (H) 12/13/2016   TRIG 211 (H) 12/13/2016   HDL 32 (L) 12/13/2016   CHOLHDL 7.0 12/13/2016   VLDL 42 (H) 12/13/2016   LDLCALC 150 (H) 12/13/2016     Musculoskeletal: Strength & Muscle Tone: within normal limits Gait & Station: normal Patient leans: N/A  Psychiatric Specialty Exam: Physical Exam  Nursing note and vitals reviewed. Psychiatric: His speech is normal.  His mood appears anxious. He is withdrawn and actively hallucinating. Thought content is paranoid and delusional. Cognition and memory are normal. He expresses impulsivity. He exhibits a depressed mood. He expresses homicidal ideation. He expresses homicidal plans.    Review of Systems  Musculoskeletal: Positive for back pain.       Chronic back pain  Psychiatric/Behavioral: Positive for depression and hallucinations. The patient is nervous/anxious.   All other systems reviewed and are negative.   Blood pressure 120/86, pulse 81, temperature 98 F (36.7 C), temperature source Oral, resp. rate 18, height 6' 1"  (1.854 m), weight 122.5 kg (270 lb), SpO2 99 %.Body mass index is 35.62 kg/m.  General Appearance: Casual  Eye Contact:  Good  Speech:  Clear and Coherent  Volume:  Normal  Mood:  Anxious, Depressed and Hopeless  Affect:  Blunt  Thought Process:  Goal Directed and Descriptions of Associations: Intact  Orientation:  Full (Time, Place, and Person)  Thought Content:  Last had auditory hallucinations yesterday. He is still having visual hallucinations and paranoid delusions.  Suicidal Thoughts:  No  Homicidal Thoughts:  He denied any intent to hurt anyone on the unit, just his parents  Memory:  Immediate;   Fair Recent;   Fair Remote;   Fair  Judgement:  Poor  Insight:  Lacking  Psychomotor Activity:  Psychomotor Retardation  Concentration:  Concentration: Fair and Attention Span: Fair  Recall:  [employer] of Knowledge:  Fair  Language:  Fair  Akathisia:  No  Handed:  Right  AIMS (if indicated):     Assets:  Communication Skills Desire for Improvement Financial Resources/Insurance Housing Intimacy Physical Health Resilience Social Support  ADL's:  Intact  Cognition:  WNL  Sleep:  Number of Hours: 6.25     Treatment Plan Summary: Daily contact with patient to assess and evaluate symptoms and progress in treatment and Medication management   Mr. Alcock is a  26 year old male with a history of untreated depression and psychosis admitted for auditory command hallucinations telling him to kill his family.  1. Homicidal ideation. Resolved. The patient is able to contract for safety in the hospital. The patient denies any intent to harm anyone in the hospital.  2. Mood and psychosis. He is now on Seroquel and dose was increased to 600 mg nightly and 50 mgTID for anxiety and psychosis. We continue Tegretol 247m po BID for mood stabilization.   3. Anxiety. Seroquel is now available. He will offer 4 mg of Minipress for PTSD at bedtime for nightmares.   4. Back Pain: The patient has chronic back pain. He was not restarted on narcotics and cannot take NSAIDs due to Crohn's Disease. He is in [employer] He is on Neurontin and Flexeril which were increased today.  4. Smoking. Nicotine patch is available.  5. Insomnia. He has Seroquel which was just increased.  6. Metabolic syndrome monitoring. Lipid panel, TSH, and hemoglobin A1c are normal.  7. EKG. Normal sinus rhythm, QTC 403.  8. Disposition. He will be discharged to home. He will follow up with [facility]. Will ensure that the patient is not actively psychotic at the time of discharge.  JOrson Slick MD 12/17/2016, 12:22 PM

## 2016-12-17 NOTE — Progress Notes (Signed)
D: Pt denies SI/HI/AVH. Pt is focused on his medications. Pt seen on the unit interacting with peers.    A: Pt was offered support and encouragement. Pt was given scheduled medications. Pt was encourage to attend groups. Q 15 minute checks were done for safety.   R:Pt attends groups and interacts well with peers and staff. Pt is taking medication.Pt receptive to treatment and safety maintained on unit.

## 2016-12-17 NOTE — Progress Notes (Signed)
Recreation Therapy Notes  Date: 05.21.18 Time: 9:30 am Location: Craft Room  Group Topic: Self-expression  Goal Area(s) Addresses:  Patient will be able to identify a color that represents each emotion. Patient will verbalize benefit of using art as a means of self-expression. Patient will verbalize one emotion experienced while participating in activity.  Behavioral Response: Attentive, Interactive  Intervention: The Colors Within Me  Activity: Patients were given a blank face worksheet and were instructed to pick a color for each emotion they were feeling and show on the worksheet how much of that emotion they were feeling.  Education: LRT educated patient on other forms of self-expression.  Education Outcome: In group clarification offered  Clinical Observations/Feedback: Patient picked a color for each emotion and showed on the worksheet how much of that emotion he was feeling. Patient contributed to group discussion by stating what emotions he was feeling, that sometimes his emotions can be static, and what emotions he felt during group.  Leonette Monarch, LRT/CTRS 12/17/2016 10:22 AM

## 2016-12-17 NOTE — [facility] Group Notes (Signed)
[facility] Group Notes:  (Nursing/MHT/Case Management/Adjunct)  Date:  12/17/2016  Time:  1:09 AM  Type of Therapy:  Group Therapy  Participation Level:  Minimal    Summary of Progress/Problems: Left Early.   Jenetta Downer Suellyn Meenan 12/17/2016, 1:09 AM

## 2016-12-17 NOTE — [facility] Group Notes (Signed)
[facility] LCSW Group Therapy Note  Date/Time: 12/17/16, 1300  Type of Therapy and Topic:  Group Therapy:  Overcoming Obstacles  Participation Level:  active  Description of Group:    In this group patients will be encouraged to explore what they see as obstacles to their own wellness and recovery. They will be guided to discuss their thoughts, feelings, and behaviors related to these obstacles. The group will process together ways to cope with barriers, with attention given to specific choices patients can make. Each patient will be challenged to identify changes they are motivated to make in order to overcome their obstacles. This group will be process-oriented, with patients participating in exploration of their own experiences as well as giving and receiving support and challenge from other group members.  Therapeutic Goals: 1. Patient will identify personal and current obstacles as they relate to admission. 2. Patient will identify barriers that currently interfere with their wellness or overcoming obstacles.  3. Patient will identify feelings, thought process and behaviors related to these barriers. 4. Patient will identify two changes they are willing to make to overcome these obstacles:    Summary of Patient Progress: Pt made some good contributions in group.  Pt identified his mental health and his pride as obstacles in his life, as well as some more practical obstacles like his lack of a job currently.  Pt participated in discussion about steps he could take to move past these obstacles like maintaining his mental health follow up appointments and being willing to ask for help.      Therapeutic Modalities:   Cognitive Behavioral Therapy Solution Focused Therapy Motivational Interviewing Relapse Prevention Therapy  Lurline Idol, LCSW

## 2016-12-17 NOTE — Plan of Care (Signed)
Problem: Education: Goal: Knowledge of the prescribed therapeutic regimen will improve Outcome: Progressing Pt knowledgeable regarding regimen, but makes multiple requests for changes. Support and encouragement provided.

## 2016-12-17 NOTE — Plan of Care (Signed)
Problem: [facility] Participation in Recreation Therapeutic Interventions Goal: STG-Patient will identify at least five coping skills for ** STG: Coping Skills - Within 4 treatment sessions, patient will verbalize at least 5 coping skills for anger in each of 2 treatment sessions to increase anger management skills.  Outcome: Completed/Met Date Met: 12/17/16 Treatment Session 2; Completed 2 out of 2: At approximately 9:05 am, LRT met with patient in consultation room. Patient verbalized 5 coping skills for anger. LRT encouraged patient to use his coping skills when he felt himself getting angry to help calm himself down.  Leonette Monarch, LRT/CTRS 05.21.18 11:45 am Goal: STG-Other Recreation Therapy Goal (Specify) STG: Stress Management - Within 4 treatment sessions, patient will verbalize understanding of the stress management techniques in each of 2 treatment sessions to increase stress management skills.  Outcome: Completed/Met Date Met: 12/17/16 Treatment Session 2; Completed 2 out of 2: At approximately 9:05 am, LRT met with patient in consultation room. Patient verbalized understanding of the stress management techniques. LRT encouraged patient to practice the techniques.  Leonette Monarch, LRT/CTRS 05.21.18 11:47 am

## 2016-12-18 DIAGNOSIS — F4310 Post-traumatic stress disorder, unspecified: Secondary | ICD-10-CM | POA: Diagnosis present

## 2016-12-18 DIAGNOSIS — K219 Gastro-esophageal reflux disease without esophagitis: Secondary | ICD-10-CM | POA: Diagnosis present

## 2016-12-18 MED ORDER — PRAZOSIN HCL 2 MG PO CAPS: 4.0000 mg | ORAL_CAPSULE | Freq: Every day | ORAL | 0 refills | Status: DC

## 2016-12-18 MED ORDER — CYCLOBENZAPRINE HCL 10 MG PO TABS: 10.0000 mg | ORAL_TABLET | Freq: Three times a day (TID) | ORAL | 0 refills | Status: DC

## 2016-12-18 MED ORDER — HYDROXYZINE HCL 50 MG PO TABS: 50.0000 mg | ORAL_TABLET | Freq: Three times a day (TID) | ORAL | Status: DC | PRN

## 2016-12-18 MED ORDER — OMEPRAZOLE 20 MG PO CPDR: 20.0000 mg | DELAYED_RELEASE_CAPSULE | Freq: Every day | ORAL | 0 refills | Status: AC

## 2016-12-18 MED ORDER — GABAPENTIN 300 MG PO CAPS: 600.0000 mg | ORAL_CAPSULE | Freq: Three times a day (TID) | ORAL | 0 refills | Status: DC

## 2016-12-18 MED ORDER — QUETIAPINE FUMARATE 300 MG PO TABS: 600.0000 mg | ORAL_TABLET | Freq: Every day | ORAL | 0 refills | Status: DC

## 2016-12-18 MED ORDER — HYDROXYZINE HCL 50 MG PO TABS: 50.0000 mg | ORAL_TABLET | Freq: Three times a day (TID) | ORAL | 0 refills | Status: DC | PRN

## 2016-12-18 MED ORDER — QUETIAPINE FUMARATE 50 MG PO TABS: 50.0000 mg | ORAL_TABLET | Freq: Three times a day (TID) | ORAL | 0 refills | Status: DC

## 2016-12-18 NOTE — Progress Notes (Signed)
Patient discharged home. DC instructions provided and explained. Medications reviewed. Rx given. All questions answered. Pt stable at discharge. Denies SI, HI, AVH. Belongings in room taken with patient. AVS, transition and risk assessment given to patient. Pt stable.
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
## 2016-12-18 NOTE — BHH Suicide Risk Assessment (Signed)
[facility] Discharge Suicide Risk Assessment   Principal Problem: Bipolar I disorder, most recent episode mixed, severe with psychotic features [facility]) Discharge Diagnoses:  Patient Active Problem List   Diagnosis Date Noted  . PTSD (post-traumatic stress disorder) [F43.10] 12/18/2016  . GERD (gastroesophageal reflux disease) [K21.9] 12/18/2016  . Bipolar I disorder, most recent episode mixed, severe with psychotic features ([place]) [F31.64] 12/12/2016  . Homicidal ideation [R45.850] 12/12/2016  . Crohn's disease ([place]) [K50.90] 12/12/2016  . Dehydration [E86.0] 04/05/2016  . Sterile pyuria [N39.0] 04/05/2016  . Hematuria [R31.9] 04/05/2016  . Hypokalemia [E87.6] 04/05/2016  . Leukocytosis [D72.829] 04/05/2016  . Tobacco use disorder [F17.200] 04/05/2016  . AKI (acute kidney injury) ([place]) [N17.9] 04/03/2016    Total Time spent with patient: 30 minutes  Musculoskeletal: Strength & Muscle Tone: within normal limits Gait & Station: normal Patient leans: N/A  Psychiatric Specialty Exam: Review of Systems  Psychiatric/Behavioral: The patient is nervous/anxious.   All other systems reviewed and are negative.   Blood pressure 124/73, pulse 80, temperature 98 F (36.7 C), temperature source Oral, resp. rate 18, height 6' 1"  (1.854 m), weight 122.5 kg (270 lb), SpO2 99 %.Body mass index is 35.62 kg/m.  General Appearance: Casual  Eye Contact::  Good  Speech:  Clear and Coherent409  Volume:  Normal  Mood:  Anxious  Affect:  Appropriate  Thought Process:  Goal Directed and Descriptions of Associations: Intact  Orientation:  Full (Time, Place, and Person)  Thought Content:  WDL  Suicidal Thoughts:  No  Homicidal Thoughts:  No  Memory:  Immediate;   Fair Recent;   Fair Remote;   Fair  Judgement:  Poor  Insight:  Shallow  Psychomotor Activity:  Normal  Concentration:  Fair  Recall:  [place]  Language: Fair  Akathisia:  No  Handed:  Right  AIMS (if indicated):      Assets:  Communication Skills Desire for Improvement Financial Resources/Insurance Housing Intimacy Physical Health Resilience Social Support  Sleep:  Number of Hours: 7  Cognition: WNL  ADL's:  Intact   Mental Status Per Nursing Assessment::   On Admission:     Demographic Factors:  Male, Caucasian and Unemployed  Loss Factors: Financial problems/change in socioeconomic status  Historical Factors: Prior suicide attempts, Family history of mental illness or substance abuse and Impulsivity  Risk Reduction Factors:   Responsible for children under 85 years of age, Sense of responsibility to family, Living with another person, especially a relative and Positive social support  Continued Clinical Symptoms:  Bipolar Disorder:   Mixed State Chronic Pain  Cognitive Features That Contribute To Risk:  None    Suicide Risk:  Minimal: No identifiable suicidal ideation.  Patients presenting with no risk factors but with morbid ruminations; may be classified as minimal risk based on the severity of the depressive symptoms  Follow-up Information    [facility] Follow up on 12/19/2016.   Why:  Please meet Sherrian Divers at 7am on this date for your follow-up appointment. Bring photo I.D.,insurance card, and current medications with you to this appointment. Contact Lanae Boast (Peer Support Specialist [phone]) with any questions.   Contact information: [place] [address] [phone]           Plan Of Care/Follow-up recommendations:  Activity:  As tolerated. Diet:  Low sodium heart healthy. Other:  Keep follow-up appointments.  Orson Slick, MD 12/18/2016, 12:00 PM

## 2016-12-18 NOTE — Progress Notes (Signed)
Recreation Therapy Notes  Date: 05.22.18 Time: 9:30 am Location: Craft Room  Group Topic: Goal Setting  Goal Area(s) Addresses:  Patient will write at least one goal. Patient will write at least one obstacle.  Behavioral Response: Did not attend   Intervention: Recovery Goal Chart  Activity: Patients were instructed to make a Recovery Goal Chart including their goals, obstacles, the date they started working on their goal, and the date they achieved their goal.  Education: LRT educated patients on healthy ways to celebrate reaching their goals.  Education Outcome: Patient did not attend group.  Clinical Observations/Feedback: Patient did not attend group.  Leonette Monarch, LRT/CTRS 12/18/2016 10:09 AM

## 2016-12-18 NOTE — Progress Notes (Signed)
Recreation Therapy Notes  INPATIENT RECREATION TR PLAN  Patient Details Name: Carl Martinez MRN: [id number] DOB: 09/17/1990 Today's Date: 12/18/2016  Rec Therapy Plan Is patient appropriate for Therapeutic Recreation?: Yes Treatment times per week: At least once a week TR Treatment/Interventions: 1:1 session, Group participation (Comment) (Appropriate participation in daily recreational therapy tx)  Discharge Criteria Pt will be discharged from therapy if:: Treatment goals are met, Discharged Treatment plan/goals/alternatives discussed and agreed upon by:: Patient/family  Discharge Summary Short term goals set: See Care Plan Short term goals met: Complete Progress toward goals comments: One-to-one attended Which groups?: Leisure education, Other (Comment) (Self-expression) One-to-one attended: Anger management, stress management Reason goals not met: N/A Therapeutic equipment acquired: None Reason patient discharged from therapy: Discharge from hospital Pt/family agrees with progress & goals achieved: Yes Date patient discharged from therapy: 12/18/16   Leonette Monarch, LRT/CTRS 12/18/2016, 3:48 PM

## 2016-12-18 NOTE — BHH Group Notes (Signed)
[facility] Group Notes:  (Nursing/MHT/Case Management/Adjunct)  Date:  12/18/2016  Time:  12:07 AM  Type of Therapy:  Group Therapy  Participation Level:  Active  Participation Quality:  Appropriate  Affect:  Appropriate  Cognitive:  Appropriate  Insight:  Appropriate  Engagement in Group:  Engaged  Modes of Intervention:  Discussion  Summary of Progress/Problems:  Carl Martinez 12/18/2016, 12:07 AM

## 2016-12-19 NOTE — Progress Notes (Signed)
  [facility] Adult Case Management Discharge Plan : *Late Entry  Will you be returning to the same living situation after discharge:  Yes,  home with family. At discharge, do you have transportation home?: Yes,  famili will pick pt up. Do you have the ability to pay for your medications: No.  Release of information consent forms completed and in the chart;  Patient's signature needed at discharge.  Patient to Follow up at: Follow-up Information    [facility] Follow up on 12/19/2016.   Why:  Please meet Sherrian Divers at 7am on this date for your follow-up appointment. Bring photo I.D.,insurance card, and current medications with you to this appointment. Contact Lanae Boast (Peer Support Specialist [phone]) with any questions.   Contact information: [address] [place] [address] [phone]           Next level of care provider has access to [facility] and Suicide Prevention discussed: Yes,  SPE completed with patient and aunt  Have you used any form of tobacco in the last 30 days? (Cigarettes, Smokeless Tobacco, Cigars, and/or Pipes): Yes  Has patient been referred to the Quitline?: Patient refused referral  Patient has been referred for addiction treatment: N/A  Emilie Rutter, MSW, LCSW-A 12/19/2016, 9:00 AM

## 2016-12-23 ENCOUNTER — Encounter: Payer: Self-pay | Admitting: *Deleted

## 2016-12-23 DIAGNOSIS — F3164 Bipolar disorder, current episode mixed, severe, with psychotic features: Secondary | ICD-10-CM | POA: Diagnosis not present

## 2016-12-23 DIAGNOSIS — F209 Schizophrenia, unspecified: Secondary | ICD-10-CM | POA: Diagnosis not present

## 2016-12-23 DIAGNOSIS — Z79899 Other long term (current) drug therapy: Secondary | ICD-10-CM | POA: Insufficient documentation

## 2016-12-23 DIAGNOSIS — F17210 Nicotine dependence, cigarettes, uncomplicated: Secondary | ICD-10-CM | POA: Insufficient documentation

## 2016-12-23 DIAGNOSIS — Z046 Encounter for general psychiatric examination, requested by authority: Secondary | ICD-10-CM | POA: Diagnosis present

## 2016-12-23 LAB — CBC
HEMATOCRIT: 41.8 % (ref 40.0–52.0)
Hemoglobin: 14.8 g/dL (ref 13.0–18.0)
MCH: 32 pg (ref 26.0–34.0)
MCHC: 35.3 g/dL (ref 32.0–36.0)
MCV: 90.4 fL (ref 80.0–100.0)
PLATELETS: 199 10*3/uL (ref 150–440)
RBC: 4.62 MIL/uL (ref 4.40–5.90)
RDW: 13.8 % (ref 11.5–14.5)
WBC: 13.1 10*3/uL — ABNORMAL HIGH (ref 3.8–10.6)

## 2016-12-23 LAB — COMPREHENSIVE METABOLIC PANEL
ALBUMIN: 3.8 g/dL (ref 3.5–5.0)
ALT: 62 U/L (ref 17–63)
ANION GAP: 10 (ref 5–15)
AST: 43 U/L — AB (ref 15–41)
Alkaline Phosphatase: 93 U/L (ref 38–126)
BUN: 13 mg/dL (ref 6–20)
CO2: 23 mmol/L (ref 22–32)
Calcium: 9.1 mg/dL (ref 8.9–10.3)
Chloride: 106 mmol/L (ref 101–111)
Creatinine, Ser: 1.02 mg/dL (ref 0.61–1.24)
GFR calc Af Amer: >60 mL/min (ref >60)
GFR calc non Af Amer: >60 mL/min (ref >60)
GLUCOSE: 102 mg/dL — AB (ref 65–99)
Potassium: 3.7 mmol/L (ref 3.5–5.1)
Sodium: 139 mmol/L (ref 135–145)
Total Bilirubin: 0.5 mg/dL (ref 0.3–1.2)
Total Protein: 6.8 g/dL (ref 6.5–8.1)

## 2016-12-23 LAB — ETHANOL

## 2016-12-23 LAB — SALICYLATE LEVEL: Salicylate Lvl: 12.9 mg/dL (ref 2.8–30.0)

## 2016-12-23 LAB — ACETAMINOPHEN LEVEL

## 2016-12-23 NOTE — ED Notes (Signed)
Pt sitting in hallway, visible to staff in triage; pt's belongings out at stat desk with this nurse; waiting for treatment room; pt calm and cooperative; offered water but pt declined

## 2016-12-23 NOTE — ED Notes (Signed)
Patient dressed out by this RN

## 2016-12-23 NOTE — ED Triage Notes (Signed)
Pt arrived to ED reporting that he was seen in ED last week for similar complaint of hearing voices and homicidal thoughts and was discharged and reports having no improvements in symptoms this week. Pt reports he hears voices and "demons" that tell him to hurt his family and reports to the RN that he has heard them tell him to "kill my family." Pt also reports he can physically feel the demons hurting him at night and feels them strangling him. Pt denies SI. Pt also reports he has visual hallucinations as well as auditory hallucinations. Pt denies alcohol use but is currently on hydrocodone for tooth pain. Pt reports he is sleeping normally but report shaving had more nightmares than normal.

## 2016-12-23 NOTE — ED Notes (Signed)
Sitting patient between Triage 1/2 until we have a room for him.

## 2016-12-23 NOTE — ED Notes (Signed)
Fmaily given password to contact and verbalized understanding of behavioral process.

## 2016-12-24 ENCOUNTER — Emergency Department
Admission: EM | Admit: 2016-12-24 | Discharge: 2016-12-24 | Disposition: A | Discharge status: Other Acute Inpatient Hospital | Payer: Medicaid Other | Attending: Emergency Medicine | Admitting: Emergency Medicine

## 2016-12-24 ENCOUNTER — Inpatient Hospital Stay
Admission: EM | Admit: 2016-12-24 | Discharge: 2016-12-27 | DRG: 885 | Disposition: A | Discharge status: Home / Self Care | Payer: Medicaid Other | Source: Intra-hospital | Attending: Psychiatry | Admitting: Psychiatry

## 2016-12-24 DIAGNOSIS — K5090 Crohn's disease, unspecified, without complications: Secondary | ICD-10-CM | POA: Diagnosis present

## 2016-12-24 DIAGNOSIS — K219 Gastro-esophageal reflux disease without esophagitis: Secondary | ICD-10-CM | POA: Diagnosis present

## 2016-12-24 DIAGNOSIS — F331 Major depressive disorder, recurrent, moderate: Secondary | ICD-10-CM

## 2016-12-24 DIAGNOSIS — F17210 Nicotine dependence, cigarettes, uncomplicated: Secondary | ICD-10-CM | POA: Diagnosis present

## 2016-12-24 DIAGNOSIS — F3164 Bipolar disorder, current episode mixed, severe, with psychotic features: Secondary | ICD-10-CM

## 2016-12-24 DIAGNOSIS — F4310 Post-traumatic stress disorder, unspecified: Secondary | ICD-10-CM | POA: Diagnosis present

## 2016-12-24 DIAGNOSIS — Z79899 Other long term (current) drug therapy: Secondary | ICD-10-CM | POA: Diagnosis not present

## 2016-12-24 DIAGNOSIS — M549 Dorsalgia, unspecified: Secondary | ICD-10-CM | POA: Diagnosis present

## 2016-12-24 DIAGNOSIS — F209 Schizophrenia, unspecified: Secondary | ICD-10-CM | POA: Diagnosis not present

## 2016-12-24 DIAGNOSIS — R443 Hallucinations, unspecified: Secondary | ICD-10-CM

## 2016-12-24 DIAGNOSIS — F17200 Nicotine dependence, unspecified, uncomplicated: Secondary | ICD-10-CM | POA: Diagnosis present

## 2016-12-24 DIAGNOSIS — G8929 Other chronic pain: Secondary | ICD-10-CM | POA: Diagnosis present

## 2016-12-24 DIAGNOSIS — R45850 Homicidal ideations: Secondary | ICD-10-CM

## 2016-12-24 DIAGNOSIS — F1220 Cannabis dependence, uncomplicated: Secondary | ICD-10-CM | POA: Diagnosis present

## 2016-12-24 LAB — URINE DRUG SCREEN, QUALITATIVE (ARMC ONLY)
AMPHETAMINES, UR SCREEN: NOT DETECTED
BARBITURATES, UR SCREEN: NOT DETECTED
Benzodiazepine, Ur Scrn: POSITIVE — AB
COCAINE METABOLITE, UR ~~LOC~~: NOT DETECTED
Cannabinoid 50 Ng, Ur ~~LOC~~: NOT DETECTED
MDMA (Ecstasy)Ur Screen: NOT DETECTED
METHADONE SCREEN, URINE: NOT DETECTED
OPIATE, UR SCREEN: POSITIVE — AB
PHENCYCLIDINE (PCP) UR S: NOT DETECTED
Tricyclic, Ur Screen: POSITIVE — AB

## 2016-12-24 MED ORDER — LORAZEPAM 2 MG PO TABS
2.0000 mg | ORAL_TABLET | Freq: Four times a day (QID) | ORAL | Status: DC | PRN
Start: 2016-12-24 — End: 2016-12-25
  Administered 2016-12-25: 2 mg via ORAL
  Filled 2016-12-24: qty 1

## 2016-12-24 MED ORDER — QUETIAPINE FUMARATE 300 MG PO TABS: 600.0000 mg | ORAL_TABLET | Freq: Every day | ORAL | Status: DC

## 2016-12-24 MED ORDER — QUETIAPINE FUMARATE 200 MG PO TABS
600.0000 mg | ORAL_TABLET | Freq: Every day | ORAL | Status: DC
  Administered 2016-12-24: 600 mg via ORAL
  Filled 2016-12-24: qty 3

## 2016-12-24 MED ORDER — HYDROXYZINE HCL 25 MG PO TABS
50.0000 mg | ORAL_TABLET | Freq: Three times a day (TID) | ORAL | Status: DC | PRN
  Administered 2016-12-24: 50 mg via ORAL
  Filled 2016-12-24: qty 2

## 2016-12-24 MED ORDER — GABAPENTIN 300 MG PO CAPS
600.0000 mg | ORAL_CAPSULE | Freq: Three times a day (TID) | ORAL | Status: DC
  Administered 2016-12-24 (×2): 600 mg via ORAL
  Filled 2016-12-24 (×2): qty 2

## 2016-12-24 MED ORDER — LORAZEPAM 2 MG PO TABS
2.0000 mg | ORAL_TABLET | Freq: Once | ORAL | Status: DC
  Filled 2016-12-24: qty 1

## 2016-12-24 MED ORDER — QUETIAPINE FUMARATE 25 MG PO TABS
50.0000 mg | ORAL_TABLET | Freq: Three times a day (TID) | ORAL | Status: DC
  Administered 2016-12-24 (×2): 50 mg via ORAL
  Filled 2016-12-24 (×2): qty 2

## 2016-12-24 MED ORDER — ACETAMINOPHEN 325 MG PO TABS
650.0000 mg | ORAL_TABLET | Freq: Four times a day (QID) | ORAL | Status: DC | PRN
  Administered 2016-12-25: 650 mg via ORAL
  Filled 2016-12-24: qty 2

## 2016-12-24 MED ORDER — ACETAMINOPHEN 325 MG PO TABS
650.0000 mg | ORAL_TABLET | Freq: Once | ORAL | Status: AC
  Administered 2016-12-24: 650 mg via ORAL
  Filled 2016-12-24: qty 2

## 2016-12-24 MED ORDER — ALUM & MAG HYDROXIDE-SIMETH 200-200-20 MG/5ML PO SUSP
30.0000 mL | ORAL | Status: DC | PRN
  Administered 2016-12-26 – 2016-12-27 (×3): 30 mL via ORAL
  Filled 2016-12-24 (×3): qty 30

## 2016-12-24 MED ORDER — GABAPENTIN 300 MG PO CAPS
600.0000 mg | ORAL_CAPSULE | Freq: Three times a day (TID) | ORAL | Status: DC
  Administered 2016-12-24 – 2016-12-25 (×3): 600 mg via ORAL
  Filled 2016-12-24 (×3): qty 2

## 2016-12-24 MED ORDER — MAGNESIUM HYDROXIDE 400 MG/5ML PO SUSP: 30.0000 mL | Freq: Every day | ORAL | Status: DC | PRN

## 2016-12-24 MED ORDER — LORAZEPAM 2 MG PO TABS
2.0000 mg | ORAL_TABLET | Freq: Four times a day (QID) | ORAL | Status: DC | PRN
  Administered 2016-12-24: 2 mg via ORAL

## 2016-12-24 MED ORDER — CYCLOBENZAPRINE HCL 10 MG PO TABS
10.0000 mg | ORAL_TABLET | Freq: Three times a day (TID) | ORAL | Status: DC
  Administered 2016-12-24 (×2): 10 mg via ORAL
  Filled 2016-12-24 (×2): qty 1

## 2016-12-24 MED ORDER — QUETIAPINE FUMARATE 25 MG PO TABS
50.0000 mg | ORAL_TABLET | Freq: Three times a day (TID) | ORAL | Status: DC
  Administered 2016-12-24 – 2016-12-25 (×3): 50 mg via ORAL
  Filled 2016-12-24 (×3): qty 2

## 2016-12-24 MED ORDER — PRAZOSIN HCL 2 MG PO CAPS: 4.0000 mg | ORAL_CAPSULE | Freq: Every day | ORAL | Status: DC

## 2016-12-24 MED ORDER — HYDROXYZINE HCL 50 MG PO TABS: 50.0000 mg | ORAL_TABLET | Freq: Three times a day (TID) | ORAL | Status: DC | PRN

## 2016-12-24 MED ORDER — PRAZOSIN HCL 2 MG PO CAPS
4.0000 mg | ORAL_CAPSULE | Freq: Every day | ORAL | Status: DC
  Administered 2016-12-24: 4 mg via ORAL
  Filled 2016-12-24: qty 2

## 2016-12-24 MED ORDER — CYCLOBENZAPRINE HCL 10 MG PO TABS
10.0000 mg | ORAL_TABLET | Freq: Three times a day (TID) | ORAL | Status: DC
  Administered 2016-12-24 – 2016-12-27 (×9): 10 mg via ORAL
  Filled 2016-12-24 (×9): qty 1

## 2016-12-24 NOTE — ED Notes (Signed)
Patient transferred to BMU, lower level, accompanied by staff with all belongings and paperwork. Pt stable at this time.

## 2016-12-24 NOTE — ED Notes (Signed)
BEHAVIORAL HEALTH ROUNDING Patient sleeping: Yes.   Patient alert and oriented: not applicable SLEEPING Behavior appropriate: Yes.  ; If no, describe: SLEEPING Nutrition and fluids offered: No SLEEPING Toileting and hygiene offered: NoSLEEPING Sitter present: not applicable, Q 15 min safety rounds and observation. Law enforcement present: Yes ODS

## 2016-12-24 NOTE — Tx Team (Signed)
Initial Treatment Plan 12/24/2016 6:45 PM Carl Martinez [id number]    PATIENT STRESSORS: Marital or family conflict Medication change or noncompliance   PATIENT STRENGTHS: Average or above average intelligence Capable of independent living Communication skills Supportive family/friends Work skills   PATIENT IDENTIFIED PROBLEMS: Homicidal ideations 12/24/2016  Auditory hallucinations 12/24/2016                   DISCHARGE CRITERIA:  Ability to meet basic life and health needs Adequate post-discharge living arrangements Verbal commitment to aftercare and medication compliance  PRELIMINARY DISCHARGE PLAN: Attend aftercare/continuing care group Return to previous living arrangement Return to previous work or school arrangements  PATIENT/FAMILY INVOLVEMENT: This treatment plan has been presented to and reviewed with the patient, Carl Martinez, and/or family member,   The patient and family have been given the opportunity to ask questions and make suggestions.  Merlene Morse, RN 12/24/2016, 6:45 PM

## 2016-12-24 NOTE — Consult Note (Signed)
[facility] Consult   Reason for Consult:  Consult for 26 year old man with a history of psychotic disorder. Just discharged from the hospital recently. Referring Physician:  Paduchowski Patient Identification: Carl Martinez MRN:  [id number] Principal Diagnosis: Bipolar I disorder, most recent episode mixed, severe with psychotic features [facility]) Diagnosis:   Patient Active Problem List   Diagnosis Date Noted  . PTSD (post-traumatic stress disorder) [F43.10] 12/18/2016  . GERD (gastroesophageal reflux disease) [K21.9] 12/18/2016  . Bipolar I disorder, most recent episode mixed, severe with psychotic features ([place]) [F31.64] 12/12/2016  . Homicidal ideation [R45.850] 12/12/2016  . Crohn's disease ([place]) [K50.90] 12/12/2016  . Dehydration [E86.0] 04/05/2016  . Sterile pyuria [N39.0] 04/05/2016  . Hematuria [R31.9] 04/05/2016  . Hypokalemia [E87.6] 04/05/2016  . Leukocytosis [D72.829] 04/05/2016  . Tobacco use disorder [F17.200] 04/05/2016  . AKI (acute kidney injury) ([place]) [N17.9] 04/03/2016    Total Time spent with patient: 1 hour  Subjective:   Carl Martinez is a 26 y.o. male patient admitted with "it's the same thing as last time".  HPI:  26 year old man with a history of mood and psychotic symptoms comes back to the emergency room saying that his symptoms have returned. He was discharged from the hospital last Tuesday. He said he felt good for about 2 days then the voices came back. They've gotten louder since then. They're bothering him almost constantly. He's had suicidal thoughts with thoughts of cutting himself and thoughts about hurting other people. Feels angry and agitated much of the time. Not sleeping very well not eating well. His family got frightened that his behavior and have all left him. He denies that he's been using any drugs or alcohol since getting out of the hospital. Claims to be compliant with his prescribed medicines  Social history: Not  currently working. Lives with his fiance 3 children and several members of extended family but he says they have all left since his psychosis has gotten worse.  Medical history: Patient has Crohn's disease history of colostomy history of hematuria hypokalemia  Substance abuse history: Denies any recent alcohol or drug abuse or past major problems with substance abuse       Psychiatric Historpatient has past history of psychotic disorder variably diagnosed as psychotic depression and bipolar disorder. Symptoms appear to be getting worse with more prominent hallucinations. He does have a history of anger and of self injury Risk to Self: Suicidal Ideation: No Suicidal Intent: No Is patient at risk for suicide?: No Suicidal Plan?: No Access to Means: No What has been your use of drugs/alcohol within the last 12 months?: Denied use How many times?: 0 Other Self Harm Risks: denied Triggers for Past Attempts: None known Intentional Self Injurious Behavior: None Risk to Others: Homicidal Ideation: Yes-Currently Present Thoughts of Harm to Others: Yes-Currently Present Comment - Thoughts of Harm to Others: Hallucinations are telling him to harm/kill others Current Homicidal Intent: No Current Homicidal Plan: No Access to Homicidal Means: No Identified Victim: anyone "the demon" tells me History of harm to others?: No Assessment of Violence: None Noted Does patient have access to weapons?: No Criminal Charges Pending?: No Does patient have a court date: No Prior Inpatient Therapy: Prior Inpatient Therapy: Yes Prior Therapy Dates: May 2018 Prior Therapy Facilty/Provider(s): [facility] Reason for Treatment: Hallucinations Prior Outpatient Therapy: Prior Outpatient Therapy: No Prior Therapy Dates: n/a (Awaiting 1st appointment) Prior Therapy Facilty/Provider(s): n/a Reason for Treatment: n/a Does patient have an ACCT team?: No Does patient have Intensive  In-House Services?  : No Does patient  have [facility] services? : No Does patient have P4CC services?: No  Past Medical History:  Past Medical History:  Diagnosis Date  . Crohn disease [facility])     Past Surgical History:  Procedure Laterality Date  . CHOLECYSTECTOMY     Family History: History reviewed. No pertinent family history. Family Psychiatric  HistorNo known family historySocial History:  History  Alcohol Use No     History  Drug Use No    Social History   Social History  . Marital status: Legally Separated    Spouse name: N/A  . Number of children: N/A  . Years of education: N/A   Social History Main Topics  . Smoking status: Current Every Day Smoker    Packs/day: 1.50    Years: 10.00    Types: Cigarettes  . Smokeless tobacco: Never Used  . Alcohol use No  . Drug use: No  . Sexual activity: Yes    Birth control/ protection: None   Other Topics Concern  . None   Social History Narrative  . None   Additional Social History:    Allergies:   Allergies  Allergen Reactions  . Nsaids Swelling    Chrons- swell up on inside per pt    Labs:  Results for orders placed or performed during the hospital encounter of 12/24/16 (from the past 48 hour(s))  Comprehensive metabolic panel     Status: Abnormal   Collection Time: 12/23/16 10:45 PM  Result Value Ref Range   Sodium 139 135 - 145 mmol/L   Potassium 3.7 3.5 - 5.1 mmol/L   Chloride 106 101 - 111 mmol/L   CO2 23 22 - 32 mmol/L   Glucose, Bld 102 (H) 65 - 99 mg/dL   BUN 13 6 - 20 mg/dL   Creatinine, Ser 1.02 0.61 - 1.24 mg/dL   Calcium 9.1 8.9 - 10.3 mg/dL   Total Protein 6.8 6.5 - 8.1 g/dL   Albumin 3.8 3.5 - 5.0 g/dL   AST 43 (H) 15 - 41 U/L   ALT 62 17 - 63 U/L   Alkaline Phosphatase 93 38 - 126 U/L   Total Bilirubin 0.5 0.3 - 1.2 mg/dL   GFR calc non Af Amer >60 >60 mL/min   GFR calc Af Amer >60 >60 mL/min    Comment: (NOTE) The eGFR has been calculated using the CKD EPI equation. This calculation has not been validated in all  clinical situations. eGFR's persistently <60 mL/min signify possible Chronic Kidney Disease.    Anion gap 10 5 - 15  Ethanol     Status: None   Collection Time: 12/23/16 10:45 PM  Result Value Ref Range   Alcohol, Ethyl (B) <5 <5 mg/dL    Comment:        LOWEST DETECTABLE LIMIT FOR SERUM ALCOHOL IS 5 mg/dL FOR MEDICAL PURPOSES ONLY   Salicylate level     Status: None   Collection Time: 12/23/16 10:45 PM  Result Value Ref Range   Salicylate Lvl 62.0 2.8 - 30.0 mg/dL  Acetaminophen level     Status: Abnormal   Collection Time: 12/23/16 10:45 PM  Result Value Ref Range   Acetaminophen (Tylenol), Serum <10 (L) 10 - 30 ug/mL    Comment:        THERAPEUTIC CONCENTRATIONS VARY SIGNIFICANTLY. A RANGE OF 10-30 ug/mL MAY BE AN EFFECTIVE CONCENTRATION FOR MANY PATIENTS. HOWEVER, SOME ARE BEST TREATED AT CONCENTRATIONS OUTSIDE THIS RANGE. ACETAMINOPHEN CONCENTRATIONS >  150 ug/mL AT 4 HOURS AFTER INGESTION AND >50 ug/mL AT 12 HOURS AFTER INGESTION ARE OFTEN ASSOCIATED WITH TOXIC REACTIONS.   cbc     Status: Abnormal   Collection Time: 12/23/16 10:45 PM  Result Value Ref Range   WBC 13.1 (H) 3.8 - 10.6 K/uL   RBC 4.62 4.40 - 5.90 MIL/uL   Hemoglobin 14.8 13.0 - 18.0 g/dL   HCT 41.8 40.0 - 52.0 %   MCV 90.4 80.0 - 100.0 fL   MCH 32.0 26.0 - 34.0 pg   MCHC 35.3 32.0 - 36.0 g/dL   RDW 13.8 11.5 - 14.5 %   Platelets 199 150 - 440 K/uL  Urine Drug Screen, Qualitative     Status: Abnormal   Collection Time: 12/23/16 10:45 PM  Result Value Ref Range   Tricyclic, Ur Screen POSITIVE (A) NONE DETECTED   Amphetamines, Ur Screen NONE DETECTED NONE DETECTED   MDMA (Ecstasy)Ur Screen NONE DETECTED NONE DETECTED   Cocaine Metabolite,Ur [place] NONE DETECTED NONE DETECTED   Opiate, Ur Screen POSITIVE (A) NONE DETECTED   Phencyclidine (PCP) Ur S NONE DETECTED NONE DETECTED   Cannabinoid 50 Ng, Ur [place] NONE DETECTED NONE DETECTED   Barbiturates, Ur Screen NONE DETECTED NONE DETECTED    Benzodiazepine, Ur Scrn POSITIVE (A) NONE DETECTED   Methadone Scn, Ur NONE DETECTED NONE DETECTED    Comment: (NOTE) 846  Tricyclics, urine               Cutoff 1000 ng/mL 200  Amphetamines, urine             Cutoff 1000 ng/mL 300  MDMA (Ecstasy), urine           Cutoff 500 ng/mL 400  Cocaine Metabolite, urine       Cutoff 300 ng/mL 500  Opiate, urine                   Cutoff 300 ng/mL 600  Phencyclidine (PCP), urine      Cutoff 25 ng/mL 700  Cannabinoid, urine              Cutoff 50 ng/mL 800  Barbiturates, urine             Cutoff 200 ng/mL 900  Benzodiazepine, urine           Cutoff 200 ng/mL 1000 Methadone, urine                Cutoff 300 ng/mL 1100 1200 The urine drug screen provides only a preliminary, unconfirmed 1300 analytical test result and should not be used for non-medical 1400 purposes. Clinical consideration and professional judgment should 1500 be applied to any positive drug screen result due to possible 1600 interfering substances. A more specific alternate chemical method 1700 must be used in order to obtain a confirmed analytical result.  1800 Gas chromato graphy / mass spectrometry (GC/MS) is the preferred 1900 confirmatory method.     Current Facility-Administered Medications  Medication Dose Route Frequency Provider Last Rate Last Dose  . cyclobenzaprine (FLEXERIL) tablet 10 mg  10 mg Oral TID Loney Hering, MD   10 mg at 12/24/16 0825  . gabapentin (NEURONTIN) capsule 600 mg  600 mg Oral TID Loney Hering, MD   600 mg at 12/24/16 0825  . hydrOXYzine (ATARAX/VISTARIL) tablet 50 mg  50 mg Oral TID PRN Loney Hering, MD   50 mg at 12/24/16 1342  . LORazepam (ATIVAN) tablet 2 mg  2 mg Oral  Once Loney Hering, MD      . LORazepam (ATIVAN) tablet 2 mg  2 mg Oral Q6H PRN Harvest Dark, MD   2 mg at 12/24/16 0911  . prazosin (MINIPRESS) capsule 4 mg  4 mg Oral QHS Loney Hering, MD      . QUEtiapine (SEROQUEL) tablet 50 mg  50 mg Oral  TID Loney Hering, MD   50 mg at 12/24/16 0826  . QUEtiapine (SEROQUEL) tablet 600 mg  600 mg Oral QHS Loney Hering, MD       Current Outpatient Prescriptions  Medication Sig Dispense Refill  . cyclobenzaprine (FLEXERIL) 10 MG tablet Take 1 tablet (10 mg total) by mouth 3 (three) times daily. 90 tablet 0  . gabapentin (NEURONTIN) 300 MG capsule Take 2 capsules (600 mg total) by mouth 3 (three) times daily. 180 capsule 0  . hydrOXYzine (ATARAX/VISTARIL) 50 MG tablet Take 1 tablet (50 mg total) by mouth 3 (three) times daily as needed for anxiety. 90 tablet 0  . omeprazole (PRILOSEC) 20 MG capsule Take 1 capsule (20 mg total) by mouth daily. 30 capsule 0  . prazosin (MINIPRESS) 2 MG capsule Take 2 capsules (4 mg total) by mouth at bedtime. 60 capsule 0  . QUEtiapine (SEROQUEL) 300 MG tablet Take 2 tablets (600 mg total) by mouth at bedtime. 60 tablet 0  . QUEtiapine (SEROQUEL) 50 MG tablet Take 1 tablet (50 mg total) by mouth 3 (three) times daily. 90 tablet 0    Musculoskeletal: Strength & Muscle Tone: within normal limits Gait & Station: normal Patient leans: N/A  Psychiatric Specialty Exam: Physical Exam  Nursing note and vitals reviewed. Constitutional: He appears well-developed and well-nourished.  HENT:  Head: Normocephalic and atraumatic.  Eyes: Conjunctivae are normal. Pupils are equal, round, and reactive to light.  Neck: Normal range of motion.  Cardiovascular: Regular rhythm and normal heart sounds.   Respiratory: Effort normal. No respiratory distress.  GI: Soft.  Musculoskeletal: Normal range of motion.  Neurological: He is alert.  Skin: Skin is warm and dry.  Psychiatric: His mood appears anxious. His affect is angry. His speech is delayed and tangential. He is withdrawn. Thought content is paranoid. He expresses impulsivity. He exhibits a depressed mood. He expresses homicidal and suicidal ideation. He exhibits abnormal recent memory.    Review of Systems   Constitutional: Negative.   HENT: Negative.   Eyes: Negative.   Respiratory: Negative.   Cardiovascular: Negative.   Gastrointestinal: Negative.   Musculoskeletal: Negative.   Skin: Negative.   Neurological: Negative.   Psychiatric/Behavioral: Positive for depression, hallucinations, memory loss and suicidal ideas. Negative for substance abuse. The patient is nervous/anxious and has insomnia.     Blood pressure 117/73, pulse 91, temperature 98.1 F (36.7 C), temperature source Oral, resp. rate 16, height 6' (1.829 m), weight 122.5 kg (270 lb), SpO2 100 %.Body mass index is 36.62 kg/m.  General Appearance: Disheveled  Eye Contact:  Minimal  Speech:  Slow  Volume:  Decreased  Mood:  Dysphoric  Affect:  Depressed  Thought Process:  Goal Directed  Orientation:  Full (Time, Place, and Person)  Thought Content:  Logical, Paranoid Ideation and Rumination  Suicidal Thoughts:  Yes.  with intent/plan  Homicidal Thoughts:  Yes.  without intent/plan  Memory:  Immediate;   Good Recent;   Fair Remote;   Fair  Judgement:  Fair  Insight:  Fair  Psychomotor Activity:  Decreased  Concentration:  Concentration: Fair  Recall:  [place] of Knowledge:  Fair  Language:  Fair  Akathisia:  No  Handed:  Right  AIMS (if indicated):     Assets:  Communication Skills Desire for Improvement Housing Physical Health Social Support  ADL's:  Intact  Cognition:  WNL  Sleep:        Treatment Plan Summary: Daily contact with patient to assess and evaluate symptoms and progress in treatment, Medication management and Plan 26 year old man with psychotic disorder returns to the hospital even more agitated than before with more prominent hallucinations. Denies any substance abuse denies that he has been noncompliant. Patient appears to be dysphoric and somewhat agitated but has not been aggressive in the hospital. Continues to be compliant with medicine. Patient will be admitted to the psychiatric unit.  Continue medicines as prescribed previously in the hospital. Full set of labs to be obtained although we don't need to repeat everything since he was just here.  Disposition: Recommend psychiatric Inpatient admission when medically cleared. Supportive therapy provided about ongoing stressors.  Alethia Berthold, MD 12/24/2016 2:24 PM

## 2016-12-24 NOTE — ED Notes (Signed)
Report given to Dr Gretel Acre with [facility]. Camera placed in room and pt is understanding about process.

## 2016-12-24 NOTE — Progress Notes (Signed)
Patient verbalized that the medicines did not work for him.Patient is cooperative during admission assessment. Patient denies SI but still have passive homicidal thoughts towards somebody.Contracts for safety. Patient is positive for auditory hallucinations. Patient informed of fall risk status, fall risk assessed "low" at this time. Patient oriented to unit/staff/room. Patient denies any questions/concerns at this time. Patient safe on unit with Q15 minute checks for safety. Skin assessment & body search done,no contraband found.

## 2016-12-24 NOTE — ED Notes (Signed)
ENVIRONMENTAL ASSESSMENT  Potentially harmful objects out of patient reach: Yes.  Personal belongings secured: Yes.  Patient dressed in hospital provided attire only: Yes.  Plastic bags out of patient reach: Yes.  Patient care equipment (cords, cables, call bells, lines, and drains) shortened, removed, or accounted for: Yes.  Equipment and supplies removed from bottom of stretcher: Yes.  Potentially toxic materials out of patient reach: Yes.  Sharps container removed or out of patient reach: Yes.   BEHAVIORAL HEALTH ROUNDING Patient sleeping: Yes.   Patient alert and oriented: not applicable SLEEPING Behavior appropriate: Yes.  ; If no, describe: SLEEPING Nutrition and fluids offered: No SLEEPING Toileting and hygiene offered: NoSLEEPING Sitter present: not applicable, Q 15 min safety rounds and observation. Law enforcement present: Yes ODS

## 2016-12-24 NOTE — ED Notes (Addendum)
BEHAVIORAL HEALTH ROUNDING  Patient sleeping: No.  Patient alert and oriented: yes  Behavior appropriate: Yes. ; If no, describe:  Nutrition and fluids offered: Yes  Toileting and hygiene offered: Yes  Sitter present: not applicable, Q 15 min safety rounds and observation.  Law enforcement present: Yes ODS

## 2016-12-24 NOTE — BH Assessment (Signed)
Patient is to be admitted to [facility] by Dr. Weber Cooks.  Attending Physician will be Dr. Jerilee Hoh.   Patient has been assigned to room 314, by [place].   Intake Paper Work has been signed and placed on patient chart.  ER staff is aware of the admission (  Dr. Corky Downs, ER MD; Mateo Flow Patient's Nurse & Earlie Raveling Patient Access).

## 2016-12-24 NOTE — ED Notes (Signed)
Pt complaining of anxiety and asking for "nerve medicine."

## 2016-12-24 NOTE — ED Notes (Signed)
Pt given ginger ale to drink. Pt advised that he did not feel that he could urinate at this time.

## 2016-12-24 NOTE — ED Provider Notes (Addendum)
[facility] Emergency Department Provider Note   ____________________________________________   First MD Initiated Contact with Patient 12/24/16 [phone]     (approximate)  I have reviewed the triage vital signs and the nursing notes.   HISTORY  Chief Complaint Psychiatric Evaluation    HPI Carl Martinez is a 26 y.o. male who comes into the hospital today with some hallucinations and homicidal ideation. The patient reports that he's been seeing people and shadows as well as ghost. He also reports that demons still want him to hurt his family. The patient just got out of the hospital on Tuesday with similar symptoms. He reports as soon as he got out of the hospital the symptoms started back up. He states the medicine has not been working. The patient has been taking his medicine he reports that he is very mad that is not helping. The patient takes Seroquel, gabapentin, hydrocodone, Minipress and hydroxyzine. He also takes Prolixin. The patient was told to come back to the hospital if he was having the same symptoms that he decided to come in. The patient denies any alcohol use. He is here for evaluation.   Past Medical History:  Diagnosis Date  . Crohn disease [facility])     Patient Active Problem List   Diagnosis Date Noted  . PTSD (post-traumatic stress disorder) 12/18/2016  . GERD (gastroesophageal reflux disease) 12/18/2016  . Bipolar I disorder, most recent episode mixed, severe with psychotic features ([place]) 12/12/2016  . Homicidal ideation 12/12/2016  . Crohn's disease ([place]) 12/12/2016  . Dehydration 04/05/2016  . Sterile pyuria 04/05/2016  . Hematuria 04/05/2016  . Hypokalemia 04/05/2016  . Leukocytosis 04/05/2016  . Tobacco use disorder 04/05/2016  . AKI (acute kidney injury) ([place]) 04/03/2016    Past Surgical History:  Procedure Laterality Date  . CHOLECYSTECTOMY      Prior to Admission medications   Medication Sig Start Date End Date  Taking? Authorizing Provider  cyclobenzaprine (FLEXERIL) 10 MG tablet Take 1 tablet (10 mg total) by mouth 3 (three) times daily. 12/18/16  Yes Pucilowska, Jolanta B, MD  gabapentin (NEURONTIN) 300 MG capsule Take 2 capsules (600 mg total) by mouth 3 (three) times daily. 12/18/16  Yes Pucilowska, Jolanta B, MD  hydrOXYzine (ATARAX/VISTARIL) 50 MG tablet Take 1 tablet (50 mg total) by mouth 3 (three) times daily as needed for anxiety. 12/18/16  Yes Pucilowska, Jolanta B, MD  omeprazole (PRILOSEC) 20 MG capsule Take 1 capsule (20 mg total) by mouth daily. 12/18/16  Yes Pucilowska, Jolanta B, MD  prazosin (MINIPRESS) 2 MG capsule Take 2 capsules (4 mg total) by mouth at bedtime. 12/18/16  Yes Pucilowska, Jolanta B, MD  QUEtiapine (SEROQUEL) 300 MG tablet Take 2 tablets (600 mg total) by mouth at bedtime. 12/18/16  Yes Pucilowska, Jolanta B, MD  QUEtiapine (SEROQUEL) 50 MG tablet Take 1 tablet (50 mg total) by mouth 3 (three) times daily. 12/18/16  Yes Pucilowska, Wardell Honour, MD    Allergies Nsaids  History reviewed. No pertinent family history.  Social History Social History  Substance Use Topics  . Smoking status: Current Every Day Smoker    Packs/day: 1.50    Years: 10.00    Types: Cigarettes  . Smokeless tobacco: Never Used  . Alcohol use No    Review of Systems  Constitutional: No fever/chills Eyes: No visual changes. ENT: No sore throat. Cardiovascular: Denies chest pain. Respiratory: Denies shortness of breath. Gastrointestinal: No abdominal pain.  No nausea, no vomiting.  No diarrhea.  No constipation. Genitourinary: Negative for dysuria. Musculoskeletal: Negative for back pain. Skin: Negative for rash. Neurological: Negative for headaches, focal weakness or numbness. Psych: hallucinations, homicidal ideation  ____________________________________________   PHYSICAL EXAM:  VITAL SIGNS: ED Triage Vitals [12/23/16 2243]  Enc Vitals Group     BP (!) 167/91     Pulse Rate (!)  117     Resp 18     Temp 98.1 F (36.7 C)     Temp Source Oral     SpO2 95 %     Weight 270 lb (122.5 kg)     Height 6' (1.829 m)     Head Circumference      Peak Flow      Pain Score      Pain Loc      Pain Edu?      Excl. in [place]?     Constitutional: Alert and oriented. Well appearing and in no acute distress. Eyes: Conjunctivae are normal. PERRL. EOMI. Head: Atraumatic. Nose: No congestion/rhinnorhea. Mouth/Throat: Mucous membranes are moist.  Oropharynx non-erythematous. Cardiovascular: Normal rate, regular rhythm. Grossly normal heart sounds.  Good peripheral circulation. Respiratory: Normal respiratory effort.  No retractions. Lungs CTAB. Gastrointestinal: Soft and nontender. No distention. Positive bowel sounds Musculoskeletal: No lower extremity tenderness nor edema.   Neurologic:  Normal speech and language.  Skin:  Skin is warm, dry and intact.  Psychiatric: Homicidal ideation  ____________________________________________   LABS (all labs ordered are listed, but only abnormal results are displayed)  Labs Reviewed  COMPREHENSIVE METABOLIC PANEL - Abnormal; Notable for the following:       Result Value   Glucose, Bld 102 (*)    AST 43 (*)    All other components within normal limits  ACETAMINOPHEN LEVEL - Abnormal; Notable for the following:    Acetaminophen (Tylenol), Serum <10 (*)    All other components within normal limits  CBC - Abnormal; Notable for the following:    WBC 13.1 (*)    All other components within normal limits  ETHANOL  SALICYLATE LEVEL  URINE DRUG SCREEN, QUALITATIVE ([facility] ONLY)   ____________________________________________  EKG  none ____________________________________________  RADIOLOGY  none ____________________________________________   PROCEDURES  Procedure(s) performed: None  Procedures  Critical Care performed: No  ____________________________________________   INITIAL IMPRESSION / ASSESSMENT AND PLAN / ED  COURSE  Pertinent labs & imaging results that were available during my care of the patient were reviewed by me and considered in my medical decision making (see chart for details).  This is a 26 year old male who comes into the hospital today with hallucinations and homicidal ideation. The patient was just seen and admitted to the hospital last week and he was discharged with medications. I did give the patient 2 mg of Ativan when he initially arrived as he reports he was feeling very anxious. The patient was seen by the specialist on-call for psychiatry and they did recommend inpatient admission for stabilization. They recommend giving the patient Seroquel 100 mg by mouth 3 times a day, Lexapro, Neurontin, prazosin, Atarax. She will be admitted to the psych service.     I restart the patient on his home medications. I will allow the psych team to adjust the patient's medications appropriately. ____________________________________________   FINAL CLINICAL IMPRESSION(S) / ED DIAGNOSES  Final diagnoses:  Hallucinations  Homicidal ideation      NEW MEDICATIONS STARTED DURING THIS VISIT:  New Prescriptions   No medications on file     Note:  This  document was prepared using Systems analyst and may include unintentional dictation errors.    Loney Hering, MD 12/24/16 5800    Loney Hering, MD 12/24/16 [phone]

## 2016-12-24 NOTE — ED Notes (Signed)

## 2016-12-24 NOTE — ED Notes (Signed)
Patient brought lunch

## 2016-12-24 NOTE — ED Notes (Signed)
Pt resting in chair in triage hallway, waiting patiently for treatment room; eyes closed, resp even and unlabored

## 2016-12-24 NOTE — ED Notes (Signed)
Pt c/o of nightmare- reports being relieved that writer woke him up. Pt c/o of toothache- rates pain a 7/10.

## 2016-12-24 NOTE — ED Notes (Signed)
BEHAVIORAL HEALTH ROUNDING Patient sleeping: Yes.   Patient alert and oriented: yes Behavior appropriate: Yes.  ; If no, describe:  Nutrition and fluids offered: Yes  Toileting and hygiene offered: Yes  Sitter present: not applicable Law enforcement present: Yes

## 2016-12-24 NOTE — BH Assessment (Signed)
Assessment Note  Carl Martinez is an 26 y.o. male. Carl Martinez arrived to the ED by way of personal transportation from his mother.  He reports that he has been hallucinating a lot. He reports that his medication is not working.  He states "I have a demon inside me. He wants me to hurt my family and kill them".  He states his hallucinations have gotten worse, he states his anger is getting worse and he has been having an attitude and throwing things.  He states that he cannot get any rest. He reports an increase in depression. He reports high levels of anxiety. He reports that he has been unable to sleep due to bad dreams. He reports homicidal ideation.  He states that he has a way to do it, depending on what (his demon) says.  He reports having dreams of the demon choking him because he would not do what he wanted him to do. Patient was discharged from inpatient behavioral health 5 days prior, he reports that he feels 10 times worse than he did on his last visit.   Diagnosis: Schizoophrenia  Past Medical History:  Past Medical History:  Diagnosis Date  . Crohn disease [facility])     Past Surgical History:  Procedure Laterality Date  . CHOLECYSTECTOMY      Family History: History reviewed. No pertinent family history.  Social History:  reports that he has been smoking Cigarettes.  He has a 15.00 pack-year smoking history. He has never used smokeless tobacco. He reports that he does not drink alcohol or use drugs.  Additional Social History:  Alcohol / Drug Use History of alcohol / drug use?: No history of alcohol / drug abuse  CIWA: CIWA-Ar BP: (!) 167/91 Pulse Rate: (!) 117 COWS:    Allergies:  Allergies  Allergen Reactions  . Nsaids Swelling    Chrons- swell up on inside per pt    Home Medications:  (Not in a hospital admission)  OB/GYN Status:  No LMP for male patient.  General Assessment Data Location of Assessment: [facility] ED TTS Assessment: In system Is this a Tele or  Face-to-Face Assessment?: Face-to-Face Is this an Initial Assessment or a Re-assessment for this encounter?: Initial Assessment Marital status: Divorced Carl Martinez name: n/a Is patient pregnant?: No Pregnancy Status: No Living Arrangements: Other relatives, Parent, Spouse/significant other Can pt return to current living arrangement?: Yes Admission Status: Voluntary Is patient capable of signing voluntary admission?: Yes Referral Source: Self/Family/Friend Insurance type: Medicaid  Medical Screening Exam ([facility]) Medical Exam completed: Yes  Crisis Care Plan Living Arrangements: Other relatives, Parent, Spouse/significant other Legal Guardian: Other: (Self) Name of Psychiatrist: None Name of Therapist: None  Education Status Is patient currently in school?: No Current Grade: n/a Highest grade of school patient has completed: 11th Name of school: [employer] person: n/a  Risk to self with the past 6 months Suicidal Ideation: No Has patient been a risk to self within the past 6 months prior to admission? : No Suicidal Intent: No Has patient had any suicidal intent within the past 6 months prior to admission? : No Is patient at risk for suicide?: No Suicidal Plan?: No Has patient had any suicidal plan within the past 6 months prior to admission? : No Access to Means: No What has been your use of drugs/alcohol within the last 12 months?: Denied use Previous Attempts/Gestures: No How many times?: 0 Other Self Harm Risks: denied Triggers for Past Attempts: None known Intentional  Self Injurious Behavior: None Family Suicide History: No Recent stressful life event(s):  (Denied any stressors) Persecutory voices/beliefs?: Yes Depression: Yes Depression Symptoms: Insomnia, Despondent Substance abuse history and/or treatment for substance abuse?: No Suicide prevention information given to non-admitted patients: Not applicable  Risk to Others within the past 6  months Homicidal Ideation: Yes-Currently Present Does patient have any lifetime risk of violence toward others beyond the six months prior to admission? : Yes (comment) Thoughts of Harm to Others: Yes-Currently Present Comment - Thoughts of Harm to Others: Hallucinations are telling him to harm/kill others Current Homicidal Intent: No Current Homicidal Plan: No Access to Homicidal Means: No Identified Victim: anyone "the demon" tells me History of harm to others?: No Assessment of Violence: None Noted Does patient have access to weapons?: No Criminal Charges Pending?: No Does patient have a court date: No Is patient on probation?: No  Psychosis Hallucinations: Auditory, Visual Delusions: None noted  Mental Status Report Appearance/Hygiene: In scrubs Eye Contact: Poor Motor Activity: Unremarkable Speech: Logical/coherent Level of Consciousness: Drowsy Mood: Depressed Affect: Anxious Anxiety Level: Moderate Thought Processes: Coherent Judgement: Unimpaired Orientation: Person, Place, Time, Situation Obsessive Compulsive Thoughts/Behaviors: None  Cognitive Functioning Concentration: Fair Memory: Recent Intact IQ: Average Insight: Fair Impulse Control: Fair Appetite: Fair Sleep: Decreased Vegetative Symptoms: None  ADLScreening [facility] Assessment Services) Patient's cognitive ability adequate to safely complete daily activities?: Yes Patient able to express need for assistance with ADLs?: Yes Independently performs ADLs?: Yes (appropriate for developmental age)  Prior Inpatient Therapy Prior Inpatient Therapy: Yes Prior Therapy Dates: May 2018 Prior Therapy Facilty/Provider(s): [facility] Reason for Treatment: Hallucinations  Prior Outpatient Therapy Prior Outpatient Therapy: No Prior Therapy Dates: n/a (Awaiting 1st appointment) Prior Therapy Facilty/Provider(s): n/a Reason for Treatment: n/a Does patient have an ACCT team?: No Does patient have Intensive In-House  Services?  : No Does patient have [facility] services? : No Does patient have P4CC services?: No  ADL Screening (condition at time of admission) Patient's cognitive ability adequate to safely complete daily activities?: Yes Patient able to express need for assistance with ADLs?: Yes Independently performs ADLs?: Yes (appropriate for developmental age)       Abuse/Neglect Assessment (Assessment to be complete while patient is alone) Physical Abuse: Denies Verbal Abuse: Denies Sexual Abuse: Denies Exploitation of patient/patient's resources: Denies Self-Neglect: Denies     Regulatory affairs officer (For Healthcare) Does Patient Have a Medical Advance Directive?: No    Additional Information 1:1 In Past 12 Months?: No CIRT Risk: No Elopement Risk: No Does patient have medical clearance?: Yes     Disposition:  Disposition Initial Assessment Completed for this Encounter: Yes Disposition of Patient: Other dispositions  On Site Evaluation by:   Reviewed with Physician:    Elmer Bales 12/24/2016 1:47 AM

## 2016-12-24 NOTE — ED Notes (Signed)
Patient resting quietly in room with eyes closed.

## 2016-12-24 NOTE — ED Notes (Signed)
Pt transferred from ED to [facility] in purple scrubs. Pt wanded and oriented to unit. Pt is calm and cooperative, displaying poor eye contact during interview. Pt denies SI/HI but has been concerned about hurting his family because of auditory command hallucinations. Pt endorses auditory hallucinations at this time, but reports that "they haven't come together yet", and are just whispering. Pt remains safe with 15 minute checks.

## 2016-12-25 ENCOUNTER — Encounter: Payer: Self-pay | Admitting: Psychiatry

## 2016-12-25 DIAGNOSIS — G8929 Other chronic pain: Secondary | ICD-10-CM

## 2016-12-25 DIAGNOSIS — M549 Dorsalgia, unspecified: Secondary | ICD-10-CM

## 2016-12-25 DIAGNOSIS — F3164 Bipolar disorder, current episode mixed, severe, with psychotic features: Principal | ICD-10-CM

## 2016-12-25 LAB — LIPID PANEL
CHOL/HDL RATIO: 8 ratio
CHOLESTEROL: 209 mg/dL — AB (ref 0–200)
HDL: 26 mg/dL — AB (ref >40)
LDL Cholesterol: 133 mg/dL — ABNORMAL HIGH (ref 0–99)
TRIGLYCERIDES: 250 mg/dL — AB (ref <150)
VLDL: 50 mg/dL — ABNORMAL HIGH (ref 0–40)

## 2016-12-25 MED ORDER — NICOTINE 21 MG/24HR TD PT24
21.0000 mg | MEDICATED_PATCH | Freq: Every day | TRANSDERMAL | Status: DC
  Administered 2016-12-25 – 2016-12-27 (×3): 21 mg via TRANSDERMAL
  Filled 2016-12-25 (×3): qty 1

## 2016-12-25 MED ORDER — RISPERIDONE 1 MG PO TABS
0.5000 mg | ORAL_TABLET | Freq: Three times a day (TID) | ORAL | Status: DC
  Administered 2016-12-25 – 2016-12-26 (×4): 0.5 mg via ORAL
  Filled 2016-12-25 (×4): qty 1

## 2016-12-25 MED ORDER — TRAZODONE HCL 100 MG PO TABS
200.0000 mg | ORAL_TABLET | Freq: Every day | ORAL | Status: DC
  Administered 2016-12-25 – 2016-12-26 (×2): 200 mg via ORAL
  Filled 2016-12-25 (×2): qty 2

## 2016-12-25 MED ORDER — FLUOXETINE HCL 10 MG PO CAPS
10.0000 mg | ORAL_CAPSULE | Freq: Every day | ORAL | Status: DC
  Administered 2016-12-25 – 2016-12-26 (×2): 10 mg via ORAL
  Filled 2016-12-25 (×2): qty 1

## 2016-12-25 MED ORDER — ACETAMINOPHEN 325 MG PO TABS
650.0000 mg | ORAL_TABLET | ORAL | Status: DC | PRN
  Administered 2016-12-25 – 2016-12-27 (×7): 650 mg via ORAL
  Filled 2016-12-25 (×6): qty 2

## 2016-12-25 MED ORDER — DIVALPROEX SODIUM 500 MG PO DR TAB: 1000.0000 mg | DELAYED_RELEASE_TABLET | Freq: Every day | ORAL | Status: DC

## 2016-12-25 MED ORDER — GABAPENTIN 300 MG PO CAPS
600.0000 mg | ORAL_CAPSULE | Freq: Three times a day (TID) | ORAL | Status: DC
  Administered 2016-12-25 – 2016-12-27 (×6): 600 mg via ORAL
  Filled 2016-12-25 (×6): qty 2

## 2016-12-25 MED ORDER — DIVALPROEX SODIUM 500 MG PO DR TAB
500.0000 mg | DELAYED_RELEASE_TABLET | Freq: Two times a day (BID) | ORAL | Status: DC
  Administered 2016-12-25 – 2016-12-27 (×5): 500 mg via ORAL
  Filled 2016-12-25 (×6): qty 1

## 2016-12-25 MED ORDER — BENZOCAINE 10 % MT GEL
Freq: Four times a day (QID) | OROMUCOSAL | Status: DC | PRN
  Filled 2016-12-25: qty 9.4

## 2016-12-25 MED ORDER — DIVALPROEX SODIUM 500 MG PO DR TAB: 500.0000 mg | DELAYED_RELEASE_TABLET | Freq: Every day | ORAL | Status: DC

## 2016-12-25 NOTE — BHH Group Notes (Signed)
[facility] Group Notes:  (Nursing/MHT/Case Management/Adjunct)  Date:  12/25/2016  Time:  5:09 AM  Type of Therapy:  Group Therapy  Participation Level:  Did Not Attend    Marylynn Pearson 12/25/2016, 5:09 AM

## 2016-12-25 NOTE — Progress Notes (Signed)
Recreation Therapy Notes  Date: 05.29.18 Time: 9:30 am Location: Craft Room  Group Topic: Self-expression  Goal Area(s) Addresses:  Patient will identify one color per emotion listed on wheel. Patient will verbalize benefit of using art as a means of self-expression. Patient will verbalize one emotion experienced during session. Patient will be educated on other forms of self-expression.  Behavioral Response: Did not attend  Intervention: Emotion Wheel  Activity: Patients were given an Emotion Wheel worksheet and were instructed to pick a color for each emotion listed on the wheel.  Education: LRT educated patients on other forms of self-expression.   Education Outcome: Patient did not attend group.   Clinical Observations/Feedback: Patient did not attend group.  Leonette [facility], LRT/CTRS 12/25/2016 10:04 AM

## 2016-12-25 NOTE — Plan of Care (Signed)
Problem: Activity: Goal: Sleeping patterns will improve Outcome: Progressing Patient slept for Estimated Hours of 6.45; every 15 minutes safety round maintained, no injury or falls during this shift.

## 2016-12-25 NOTE — Plan of Care (Signed)
Problem: Education: Goal: Mental status will improve Outcome: Progressing Pt reports voices getting louder and closer

## 2016-12-25 NOTE — Progress Notes (Signed)
Patient ID: Carl Martinez, male   DOB: [birth year], 26 y.o.   MRN: [id number] Observed in the day room, pleasant on approach and came out to his room to talk with me, "I was just here last week, the first 2 days was fine, I was okay, my mood stabilizers and minipress were not working; I started to hear voices to kill my family, mu mother first, I got my chain saw and tear down my property...they brought me back here.." Denied SI, endorses AV/H and HI towards family. Pt c/o elevated WBC count due to infected teeth, chart reviewed: WBC=13.1 on 10/23/2016 @ 2245, UDS+for Cocaine, Opiate & TCA. No antibiotics yet.

## 2016-12-25 NOTE — Progress Notes (Signed)
Patient continues to complain of pain to tooth. Continues to ask for anxiety medications. Patient reports voices are yelling at him now instead of being distant. Pt noted on phone talking with family. No distress. Med and group compliant. Denies SI, reports voices tell him to hurt people. Encouragement and support offered. Medications given as prescribed. Safety checks maintained. Pt receptive and remains safe on unit with q 15 min checks.

## 2016-12-25 NOTE — Progress Notes (Signed)
Recreation Therapy Notes  INPATIENT RECREATION THERAPY ASSESSMENT  Patient Details Name: Carl Martinez MRN: [id number] DOB: 06-Jan-1991 Today's Date: 12/25/2016  Patient Stressors: Other (Comment) (Pt was mean to his fiance, having lots of people in the household is stressful)  Coping Skills:   Isolate, Arguments, Avoidance, Exercise, Music, Sports, Other (Comment) (Walk away, fish, yard work)  Horticulturist, commercial: Anger, Communication, Concentration, Expressing Yourself, Self-Esteem/Confidence, Technical sales engineer, Stress Management, Trusting Others  Leisure Interests (2+):  [place] care  Futures trader Resources:  Yes  Community Resources:  [employer], Other (Comment) Surveyor, minerals court)  Current Use: No  If no, Barriers?: Other (Comment) (Spent time with his children)  Patient Strengths:  Confidence, mathematical skills  Patient Identified Areas of Improvement:  Not arguing as much, trust  Current Recreation Participation:  Working  Patient Goal for Hospitalization:  Try to get medication figured out  [place] of Residence:  [place] of Residence:  [place]   Current SI (including self-harm):  No  Current HI:  Yes ("But that's normal")  Consent to Intern Participation: N/A   Leonette [facility]arch, LRT/CTRS 12/25/2016, 1:14 PM

## 2016-12-25 NOTE — BHH Group Notes (Signed)
[facility] LCSW Group Therapy Note  Date/Time: 12/25/16, 1500  Type of Therapy/Topic:  Group Therapy:  Feelings about Diagnosis  Participation Level:  Did Not Attend   Mood:   Description of Group:    This group will allow patients to explore their thoughts and feelings about diagnoses they have received. Patients will be guided to explore their level of understanding and acceptance of these diagnoses. Facilitator will encourage patients to process their thoughts and feelings about the reactions of others to their diagnosis, and will guide patients in identifying ways to discuss their diagnosis with significant others in their lives. This group will be process-oriented, with patients participating in exploration of their own experiences as well as giving and receiving support and challenge from other group members.   Therapeutic Goals: 1. Patient will demonstrate understanding of diagnosis as evidence by identifying two or more symptoms of the disorder:  2. Patient will be able to express two feelings regarding the diagnosis 3. Patient will demonstrate ability to communicate their needs through discussion and/or role plays  Summary of Patient Progress:        Therapeutic Modalities:   Cognitive Behavioral Therapy Brief Therapy Feelings Identification   Lurline Idol, LCSW
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
## 2016-12-25 NOTE — BHH Suicide Risk Assessment (Signed)
[facility] Admission Suicide Risk Assessment   Nursing information obtained from:  Patient Demographic factors:  Male, Caucasian Current Mental Status:  Thoughts of violence towards others Loss Factors:  NA Historical Factors:  NA Risk Reduction Factors:  Employed, Living with another person, especially a relative  Total Time spent with patient: 1 hour Principal Problem: Bipolar I disorder, most recent episode mixed, severe with psychotic features ([place]) Diagnosis:   Patient Active Problem List   Diagnosis Date Noted  . Chronic back pain [M54.9, G89.29] 12/25/2016  . PTSD (post-traumatic stress disorder) [F43.10] 12/18/2016  . GERD (gastroesophageal reflux disease) [K21.9] 12/18/2016  . Bipolar I disorder, most recent episode mixed, severe with psychotic features ([place]) [F31.64] 12/12/2016  . Crohn's disease ([place]) [K50.90] 12/12/2016  . Tobacco use disorder [F17.200] 04/05/2016   Subjective Data:   Continued Clinical Symptoms:  Alcohol Use Disorder Identification Test Final Score (AUDIT): 0 The "Alcohol Use Disorders Identification Test", Guidelines for Use in Primary Care, Second Edition.  World Pharmacologist [facility]). Score between 0-7:  no or low risk or alcohol related problems. Score between 8-15:  moderate risk of alcohol related problems. Score between 16-19:  high risk of alcohol related problems. Score 20 or above:  warrants further diagnostic evaluation for alcohol dependence and treatment.   CLINICAL FACTORS:   Severe Anxiety and/or Agitation Depression:   Aggression Impulsivity Chronic Pain More than one psychiatric diagnosis Previous Psychiatric Diagnoses and Treatments    Psychiatric Specialty Exam: Physical Exam  ROS  Blood pressure 139/83, pulse 89, temperature 97.9 F (36.6 C), temperature source Oral, resp. rate 18, height 6' 1"  (1.854 m), weight 129.3 kg (285 lb), SpO2 98 %.Body mass index is 37.6 kg/m.  General Appearance: Well Groomed  Eye Contact:   Good  Speech:  Clear and Coherent  Volume:  Normal  Mood:  Dysphoric  Affect:  Appropriate and Congruent  Thought Process:  Linear and Descriptions of Associations: Intact  Orientation:  Full (Time, Place, and Person)  Thought Content:  Hallucinations: None  Suicidal Thoughts:  No  Homicidal Thoughts:  No  Memory:  Immediate;   Fair Recent;   Fair Remote;   Fair  Judgement:  Fair  Insight:  Fair  Psychomotor Activity:  Normal  Concentration:  Concentration: Fair and Attention Span: Fair  Recall:  [employer] of Knowledge:  Good  Language:  Good  Akathisia:  No  Handed:    AIMS (if indicated):     Assets:  Communication Skills Social Support  ADL's:  Intact  Cognition:  WNL  Sleep:  Number of Hours: 6.45      COGNITIVE FEATURES THAT CONTRIBUTE TO RISK:  Polarized thinking    SUICIDE RISK:   Moderate:  Frequent suicidal ideation with limited intensity, and duration, some specificity in terms of plans, no associated intent, good self-control, limited dysphoria/symptomatology, some risk factors present, and identifiable protective factors, including available and accessible social support.  PLAN OF CARE: admit to [facility]  I certify that inpatient services furnished can reasonably be expected to improve the patient's condition.   Hildred Priest, MD 12/25/2016, 1:01 PM
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
## 2016-12-25 NOTE — [facility] Suicide Risk Assessment (Signed)
[place] INPATIENT:  Family/Significant Other Suicide Prevention Education  Suicide Prevention Education:  Contact Attempts: [name], aunt, [phone], has been identified by the patient as the family member/significant other with whom the patient will be residing, and identified as the person(s) who will aid the patient in the event of a mental health crisis.  With written consent from the patient, two attempts were made to provide suicide prevention education, prior to and/or following the patient's discharge.  We were unsuccessful in providing suicide prevention education.  A suicide education pamphlet was given to the patient to share with family/significant other.  Date and time of first attempt:12/25/16, 44 Date and time of second attempt:  [name], LCSW 12/25/2016, 2:42 PM

## 2016-12-26 DIAGNOSIS — F331 Major depressive disorder, recurrent, moderate: Secondary | ICD-10-CM

## 2016-12-26 LAB — HEMOGLOBIN A1C
Hgb A1c MFr Bld: 5.1 % (ref 4.8–5.6)
Mean Plasma Glucose: 100 mg/dL

## 2016-12-26 MED ORDER — FLUOXETINE HCL 20 MG PO CAPS
20.0000 mg | ORAL_CAPSULE | Freq: Every day | ORAL | Status: DC
  Administered 2016-12-27: 20 mg via ORAL
  Filled 2016-12-26: qty 1

## 2016-12-26 MED ORDER — RISPERIDONE 1 MG PO TABS
1.0000 mg | ORAL_TABLET | Freq: Three times a day (TID) | ORAL | Status: DC
  Administered 2016-12-26 – 2016-12-27 (×3): 1 mg via ORAL
  Filled 2016-12-26 (×3): qty 1

## 2016-12-26 NOTE — Progress Notes (Signed)
Patient irritable early shift requesting medications. Reports voices are loud. Pt later calmed and stated better. Medications adjusted. Pt reports it did help. Denies SI, HI. Pt med and group compliant. Constantly requesting medications for pain and anxiety. Encouragement and support offered. Safety checks maintained. Pt receptive and remains safe on unit with q 15 min checks.

## 2016-12-26 NOTE — Tx Team (Signed)
Interdisciplinary Treatment and Diagnostic Plan Update  12/26/2016 Time of Session: [place] MRN: [id number]  Principal Diagnosis: MDD (major depressive disorder), recurrent episode, moderate (HCC)  Secondary Diagnoses: Principal Problem:   MDD (major depressive disorder), recurrent episode, moderate (HCC) Active Problems:   Tobacco use disorder   Crohn's disease ([place])   PTSD (post-traumatic stress disorder)   GERD (gastroesophageal reflux disease)   Chronic back pain   Current Medications:  Current Facility-Administered Medications  Medication Dose Route Frequency Provider Last Rate Last Dose  . acetaminophen (TYLENOL) tablet 650 mg  650 mg Oral Q4H PRN Hildred Priest, MD   650 mg at 12/26/16 1219  . alum & mag hydroxide-simeth (MAALOX/MYLANTA) 200-200-20 MG/5ML suspension 30 mL  30 mL Oral Q4H PRN Clapacs, John T, MD   30 mL at 12/26/16 1310  . benzocaine (ORAJEL) 10 % mucosal gel   Mouth/Throat QID PRN Hildred Priest, MD      . cyclobenzaprine (FLEXERIL) tablet 10 mg  10 mg Oral TID Clapacs, Madie Reno, MD   10 mg at 12/26/16 1214  . divalproex (DEPAKOTE) DR tablet 500 mg  500 mg Oral Q12H Hildred Priest, MD   500 mg at 12/26/16 0804  . [START ON 12/27/2016] FLUoxetine (PROZAC) capsule 20 mg  20 mg Oral Daily Hildred Priest, MD      . gabapentin (NEURONTIN) capsule 600 mg  600 mg Oral TID Hildred Priest, MD   600 mg at 12/26/16 1214  . magnesium hydroxide (MILK OF MAGNESIA) suspension 30 mL  30 mL Oral Daily PRN Clapacs, John T, MD      . nicotine (NICODERM CQ - dosed in mg/24 hours) patch 21 mg  21 mg Transdermal Daily Hildred Priest, MD   21 mg at 12/26/16 0804  . risperiDONE (RISPERDAL) tablet 1 mg  1 mg Oral TID Hildred Priest, MD      . traZODone (DESYREL) tablet 200 mg  200 mg Oral QHS Hildred Priest, MD   200 mg at 12/25/16 2156   PTA Medications: Prescriptions Prior to  Admission  Medication Sig Dispense Refill Last Dose  . cyclobenzaprine (FLEXERIL) 10 MG tablet Take 1 tablet (10 mg total) by mouth 3 (three) times daily. 90 tablet 0   . gabapentin (NEURONTIN) 300 MG capsule Take 2 capsules (600 mg total) by mouth 3 (three) times daily. 180 capsule 0   . hydrOXYzine (ATARAX/VISTARIL) 50 MG tablet Take 1 tablet (50 mg total) by mouth 3 (three) times daily as needed for anxiety. 90 tablet 0   . omeprazole (PRILOSEC) 20 MG capsule Take 1 capsule (20 mg total) by mouth daily. 30 capsule 0   . prazosin (MINIPRESS) 2 MG capsule Take 2 capsules (4 mg total) by mouth at bedtime. 60 capsule 0   . QUEtiapine (SEROQUEL) 300 MG tablet Take 2 tablets (600 mg total) by mouth at bedtime. 60 tablet 0   . QUEtiapine (SEROQUEL) 50 MG tablet Take 1 tablet (50 mg total) by mouth 3 (three) times daily. 90 tablet 0     Patient Stressors: Marital or family conflict Medication change or noncompliance  Patient Strengths: Average or above average intelligence Capable of independent living Communication skills Supportive family/friends Work skills  Treatment Modalities: Medication Management, Group therapy, Case management,  1 to 1 session with clinician, Psychoeducation, Recreational therapy.   Physician Treatment Plan for Primary Diagnosis: MDD (major depressive disorder), recurrent episode, moderate (HCC) Long Term Goal(s): Improvement in symptoms so as ready for discharge Improvement in symptoms so  as ready for discharge   Short Term Goals: Ability to identify changes in lifestyle to reduce recurrence of condition will improve Ability to verbalize feelings will improve Ability to disclose and discuss suicidal ideas Ability to demonstrate self-control will improve Ability to identify and develop effective coping behaviors will improve Ability to identify triggers associated with substance abuse/mental health issues will improve Ability to identify changes in lifestyle to  reduce recurrence of condition will improve  Medication Management: Evaluate patient's response, side effects, and tolerance of medication regimen.  Therapeutic Interventions: 1 to 1 sessions, Unit Group sessions and Medication administration.  Evaluation of Outcomes: Not Progressing  Physician Treatment Plan for Secondary Diagnosis: Principal Problem:   MDD (major depressive disorder), recurrent episode, moderate (HCC) Active Problems:   Tobacco use disorder   Crohn's disease ([place])   PTSD (post-traumatic stress disorder)   GERD (gastroesophageal reflux disease)   Chronic back pain  Long Term Goal(s): Improvement in symptoms so as ready for discharge Improvement in symptoms so as ready for discharge   Short Term Goals: Ability to identify changes in lifestyle to reduce recurrence of condition will improve Ability to verbalize feelings will improve Ability to disclose and discuss suicidal ideas Ability to demonstrate self-control will improve Ability to identify and develop effective coping behaviors will improve Ability to identify triggers associated with substance abuse/mental health issues will improve Ability to identify changes in lifestyle to reduce recurrence of condition will improve     Medication Management: Evaluate patient's response, side effects, and tolerance of medication regimen.  Therapeutic Interventions: 1 to 1 sessions, Unit Group sessions and Medication administration.  Evaluation of Outcomes: Not Progressing   RN Treatment Plan for Primary Diagnosis: MDD (major depressive disorder), recurrent episode, moderate (HCC) Long Term Goal(s): Knowledge of disease and therapeutic regimen to maintain health will improve  Short Term Goals: Ability to verbalize feelings will improve, Ability to identify and develop effective coping behaviors will improve and Compliance with prescribed medications will improve  Medication Management: RN will administer medications as  ordered by provider, will assess and evaluate patient's response and provide education to patient for prescribed medication. RN will report any adverse and/or side effects to prescribing provider.  Therapeutic Interventions: 1 on 1 counseling sessions, Psychoeducation, Medication administration, Evaluate responses to treatment, Monitor vital signs and CBGs as ordered, Perform/monitor CIWA, COWS, AIMS and Fall Risk screenings as ordered, Perform wound care treatments as ordered.  Evaluation of Outcomes: Progressing   LCSW Treatment Plan for Primary Diagnosis: MDD (major depressive disorder), recurrent episode, moderate ([place]) Long Term Goal(s): Safe transition to appropriate next level of care at discharge, Engage patient in therapeutic group addressing interpersonal concerns.  Short Term Goals: Engage patient in aftercare planning with referrals and resources, Increase social support and Increase skills for wellness and recovery  Therapeutic Interventions: Assess for all discharge needs, 1 to 1 time with Social worker, Explore available resources and support systems, Assess for adequacy in community support network, Educate family and significant other(s) on suicide prevention, Complete Psychosocial Assessment, Interpersonal group therapy.  Evaluation of Outcomes: Progressing   Progress in Treatment: Attending groups: Yes. Participating in groups: Yes. Taking medication as prescribed: Yes. Toleration medication: Yes. Family/Significant other contact made: Yes, individual(s) contacted:  aunt Patient understands diagnosis: Yes. Discussing patient identified problems/goals with staff: Yes. Medical problems stabilized or resolved: Yes. Denies suicidal/homicidal ideation: Yes. Issues/concerns per patient self-inventory: No. Other: none  New problem(s) identified: No, Describe:  none  New Short Term/Long Term Goal(s): Pt  goal: decreased voices.  Discharge Plan or Barriers: Pt already  active with [facility] for medication management and counseling.  Reason for Continuation of Hospitalization: Medication stabilization  Estimated Length of Stay: 1-3 days.  Attendees: Patient: Carl Martinez 12/26/2016   Physician: Dr. Jerilee Hoh, MD 12/26/2016   Nursing: Polly Cobia, RN 12/26/2016   RN Care Manager: 12/26/2016   Social Worker: Lurline Idol, LCSW 12/26/2016   Recreational Therapist:  12/26/2016   Other:  12/26/2016   Other:  12/26/2016   Other: 12/26/2016     Scribe for Treatment Team: [name], LCSW 12/26/2016 3:15 PM

## 2016-12-26 NOTE — Plan of Care (Signed)
Problem: Coping: Goal: Ability to verbalize frustrations and anger appropriately will improve Outcome: Progressing Patient improved with verbalization of frustrations. Pt no longer angry

## 2016-12-26 NOTE — B[facility] Group Notes (Signed)
[place] LCSW Group Therapy  12/26/2016 1:47 PM  Type of Therapy:  Group Therapy  Participation Level:  Active  Participation Quality:  Appropriate and Sharing  Affect:  Appropriate  Cognitive:  Alert  Insight:  Limited  Engagement in Therapy:  Engaged  Modes of Intervention:  Activity, Discussion, Education, Problem-solving, Art therapist, Socialization and Support  Summary of Progress/Problems: Emotional Regulation: Patients will identify both negative and positive emotions. They will discuss emotions they have difficulty regulating and how they impact their lives. Patients will be asked to identify healthy coping skills to combat unhealthy reactions to negative emotions.   Eavan Gonterman G. [place], [place] 12/26/2016, 1:47 PM

## 2016-12-26 NOTE — B[facility] Suicide Risk Assessment (Signed)
[place] INPATIENT:  Family/Significant Other Suicide Prevention Education  Suicide Prevention Education:  Education Completed; [name], aunt, [phone], has been identified by the patient as the family member/significant other with whom the patient will be residing, and identified as the person(s) who will aid the patient in the event of a mental health crisis (suicidal ideations/suicide attempt).  With written consent from the patient, the family member/significant other has been provided the following suicide prevention education, prior to the and/or following the discharge of the patient.  The suicide prevention education provided includes the following:  Suicide risk factors  Suicide prevention and interventions  National Suicide Hotline telephone number  [facility] assessment telephone number  [facility] Emergency Assistance [place] and/or Residential Mobile Crisis Unit telephone number  Request made of family/significant other to:  Remove weapons (e.g., guns, rifles, knives), all items previously/currently identified as safety concern. No guns in the home, per Maudie Mercury.   Remove drugs/medications (over-the-counter, prescriptions, illicit drugs), all items previously/currently identified as a safety concern. Kim agreed to check on excess medication and lock it up.  The family member/significant other verbalizes understanding of the suicide prevention education information provided.  The family member/significant other agrees to remove the items of safety concern listed above.  Maudie Mercury was concerned about discharge tomorrow as pt called this afternoon and said he would rather be dead than continue with the voices and demons.  There are several children in the home and pt was very angry over the weekend, which also concerns them.  [name], LCSW 12/26/2016, 3:02 PM

## 2016-12-26 NOTE — [facility] Group Notes (Signed)
[place] Group Notes:  (Nursing/MHT/Case Management/Adjunct)  Date:  12/26/2016  Time:  12:25 AM  Type of Therapy:  Group Therapy  Participation Level:  Active  Participation Quality:  Appropriate  Affect:  Appropriate  Cognitive:  Alert  Insight:  Good  Engagement in Group:  Engaged  Modes of Intervention:  Support  Summary of Progress/Problems:  Carl Martinez 12/26/2016, 12:25 AM

## 2016-12-26 NOTE — Plan of Care (Signed)
Problem: Activity: Goal: Sleeping patterns will improve Outcome: Progressing Patient slept for Estimated Hours of 6.45; every 15 minutes safety round maintained, no injury or falls during this shift.
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
## 2016-12-26 NOTE — Progress Notes (Signed)
Patient ID: Carl Martinez, male   DOB: [birth year], 26 y.o.   MRN: [id number] Patient continues to endorse active auditory and visual hallucinations, "the shadows are back and they are in my face, they asking me to punch someone, they are taking over my hands, I can't control them. My medications are not working....." No threats against hospital property or other patients at this time.

## 2016-12-27 LAB — HEPATIC FUNCTION PANEL
ALBUMIN: 4.1 g/dL (ref 3.5–5.0)
ALT: 42 U/L (ref 17–63)
AST: 32 U/L (ref 15–41)
Alkaline Phosphatase: 86 U/L (ref 38–126)
BILIRUBIN TOTAL: 0.5 mg/dL (ref 0.3–1.2)
Bilirubin, Direct: <0.1 mg/dL — ABNORMAL LOW (ref 0.1–0.5)
TOTAL PROTEIN: 7.3 g/dL (ref 6.5–8.1)

## 2016-12-27 LAB — VALPROIC ACID LEVEL: Valproic Acid Lvl: 40 ug/mL — ABNORMAL LOW (ref 50.0–100.0)

## 2016-12-27 MED ORDER — FLUOXETINE HCL 20 MG PO CAPS: 20.0000 mg | ORAL_CAPSULE | Freq: Every day | ORAL | 0 refills | Status: DC

## 2016-12-27 MED ORDER — TRAZODONE HCL 100 MG PO TABS: 200.0000 mg | ORAL_TABLET | Freq: Every day | ORAL | 0 refills | Status: DC

## 2016-12-27 MED ORDER — RISPERIDONE 1 MG PO TABS: 1.0000 mg | ORAL_TABLET | Freq: Three times a day (TID) | ORAL | 0 refills | Status: DC | PRN

## 2016-12-27 MED ORDER — DIVALPROEX SODIUM 500 MG PO DR TAB: 500.0000 mg | DELAYED_RELEASE_TABLET | Freq: Two times a day (BID) | ORAL | 0 refills | Status: DC

## 2016-12-27 NOTE — Progress Notes (Signed)
  [facility] Adult Case Management Discharge Plan :  Will you be returning to the same living situation after discharge:  Yes,  with family At discharge, do you have transportation home?: Yes,  family Do you have the ability to pay for your medications: Yes,  medicaid  Release of information consent forms completed and in the chart;  Patient's signature needed at discharge.  Patient to Follow up at: Follow-up Information    [place] on 01/01/2017.   Why:  Please attend your therapy appointment at [facility] with Randall Hiss on Tuesday, 01/01/17, at 9am.  Please bring a copy of your hospital discharge paperwork. Contact information: [place] [address] [phone]           Next level of care provider has access to [place] and Suicide Prevention discussed: Yes,  with aunt  Have you used any form of tobacco in the last 30 days? (Cigarettes, Smokeless Tobacco, Cigars, and/or Pipes): Yes  Has patient been referred to the Quitline?: Patient refused referral  Patient has been referred for addiction treatment: Yes  [name], LCSW 12/27/2016, 1:25 PM

## 2016-12-27 NOTE — Progress Notes (Signed)
D: Pt denies SI/HI/AVH. Pt is pleasant and cooperative, affect is flat but brightens upon approach. Pt appears less anxious and he is interacting with peers and staff appropriately.  A: Pt was offered support and encouragement. Pt was given scheduled medications. Pt was encouraged to attend groups. Q 15 minute checks were done for safety.  R:Pt attends groups and interacts well with peers and staff. Pt is taking medication. Pt has no complaints.Pt receptive to treatment and safety maintained on unit.

## 2016-12-27 NOTE — BHH Group Notes (Signed)
[facility] LCSW Group Therapy Note  Type of Therapy and Topic:  Group Therapy:  Goals Group: SMART Goals  Participation Level:  Patient attended group on this date and minimally participated in the group discussion.   Description of Group:   The purpose of a daily goals group is to assist and guide patients in setting recovery/wellness-related goals.  The objective is to set goals as they relate to the crisis in which they were admitted. Patients will be using SMART goal modalities to set measurable goals.  Characteristics of realistic goals will be discussed and patients will be assisted in setting and processing how one will reach their goal. Facilitator will also assist patients in applying interventions and coping skills learned in psycho-education groups to the SMART goal and process how one will achieve defined goal.  Therapeutic Goals: -Patients will develop and document one goal related to or their crisis in which brought them into treatment. -Patients will be guided by LCSW using SMART goal setting modality in how to set a measurable, attainable, realistic and time sensitive goal.  -Patients will process barriers in reaching goal. -Patients will process interventions in how to overcome and successful in reaching goal.   Summary of Patient Progress:  Patient Goal: Patient did not have a goal but was supportive to his peers and would engage in group discussion when prompted by CSW.    Therapeutic Modalities:   Motivational Interviewing Public relations account executive Therapy Crisis Intervention Model SMART goals setting  Gracy Ehly G. Claybon Jabs MSW, [facility] 12/27/2016 10:24 AM

## 2016-12-27 NOTE — Progress Notes (Signed)
Patient discharged home. DC instructions provided and explained. Medications reviewed. Rx given. All questions answered. Denies SI, Hi, AVH. Belongings returned. AVS, transition and risk assessment given to patient. Pt stable at discharge.
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
## 2016-12-27 NOTE — Plan of Care (Signed)
Problem: Coping: Goal: Ability to interact with others will improve Outcome: Progressing Interacting appropriately with peers.

## 2016-12-27 NOTE — BHH Group Notes (Signed)
[place] Group Notes:  (Nursing/MHT/Case Management/Adjunct)  Date:  12/27/2016  Time:  3:56 AM  Type of Therapy:  Psychoeducational Skills  Participation Level:  Active  Participation Quality:  Attentive and Sharing  Affect:  Flat  Cognitive:  Appropriate and Oriented  Insight:  Improving  Engagement in Group:  Improving  Modes of Intervention:  Discussion and Exploration  Summary of Progress/Problems:  Kathi Ludwig 12/27/2016, 3:56 AM
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
## 2016-12-27 NOTE — Plan of Care (Signed)
Problem: Pain Managment: Goal: General experience of comfort will improve Outcome: Not Progressing Patient has complained of tooth pain for the past three days. Patient was medicated to cope with this issue. This morning patient verbalized that his pain was now in his back, describing it as acute, aching and non-radiating.

## 2016-12-27 NOTE — Consult Note (Signed)
Psychological Assessment   Name: Carl Martinez Age: 26 Date of Evaluation: Dec 26, 2016 Test(s) Administered: [place] (MMPI-2)  Reason for Referral: Carl Martinez was referred for a psychological assessment by his physician, Merlyn Albert, MD.  He was admitted to [facility] for treatment of psychotic symptoms. He was discharged 5 days prior to this current admission for similar symptoms. Please see the history and physical for further background information. Dr. Jerilee Hoh' note of 12-26-16 states "patient says he's even worse and yesterday hearing voices all day that tell him to punch others. He even told nurses that he didn't feel he had control of his hands. His staff and myself have felt that there is an element of symptom exaggeration. We are unclear as to what could be a secondary gain for this patient.  Patient says that he has failed to benefit from this pill or high-dose Seroquel which was given to him during his past admission. It is very unlikely that the symptoms he is describing as hallucinations are actual hallucinations. There is no evidence of any other symptoms consistent with a primary psychotic disorder, he is not manic or hypomanic. Thought process is linear and organized. Psychiatrically speaking there is no clear explanation for auditory hallucinations. This symptom does not appear to be psychiatric in nature. An assessment of personality structure was requested to help clarify his symptom presentation.  Validity: The MMPI-2 validity scales indicate that the clinical profile is not valid. The validity profile suggests that Carl Martinez completed the MMPI-2 in such a manner as to exaggerate his concerns and psychological problems by over-claiming symptoms. There is a very high probability that he has endorsed the MMPI-2 items inaccurately by reporting symptoms and behaviors that are rarely seen even in psychiatric  patients.  Diagnostic Impression: Carl Martinez MMPI-2 performance is more consistent with malingering than other psychiatric diagnoses given his organized and linear thought processes. If the profile were valid, his clincal scale elevations suggest an individual not able to present himself as coherently as he does.

## 2017-03-01 ENCOUNTER — Other Ambulatory Visit: Payer: Self-pay | Admitting: Student

## 2017-03-01 DIAGNOSIS — M545 Low back pain: Principal | ICD-10-CM

## 2017-03-01 DIAGNOSIS — G8929 Other chronic pain: Secondary | ICD-10-CM

## 2017-03-07 ENCOUNTER — Ambulatory Visit: Payer: Medicaid Other

## 2017-04-24 ENCOUNTER — Emergency Department
Admission: EM | Admit: 2017-04-24 | Discharge: 2017-04-24 | Disposition: A | Discharge status: Other Acute Inpatient Hospital | Payer: Medicaid Other | Attending: Emergency Medicine | Admitting: Emergency Medicine

## 2017-04-24 ENCOUNTER — Inpatient Hospital Stay
Admission: AD | Admit: 2017-04-24 | Discharge: 2017-04-29 | DRG: 885 | Disposition: A | Discharge status: Home / Self Care | Payer: Medicaid Other | Attending: Psychiatry | Admitting: Psychiatry

## 2017-04-24 ENCOUNTER — Encounter: Payer: Self-pay | Admitting: Emergency Medicine

## 2017-04-24 DIAGNOSIS — F17200 Nicotine dependence, unspecified, uncomplicated: Secondary | ICD-10-CM | POA: Diagnosis present

## 2017-04-24 DIAGNOSIS — F410 Panic disorder [episodic paroxysmal anxiety] without agoraphobia: Secondary | ICD-10-CM | POA: Diagnosis present

## 2017-04-24 DIAGNOSIS — Z818 Family history of other mental and behavioral disorders: Secondary | ICD-10-CM

## 2017-04-24 DIAGNOSIS — F4000 Agoraphobia, unspecified: Secondary | ICD-10-CM | POA: Diagnosis present

## 2017-04-24 DIAGNOSIS — F4010 Social phobia, unspecified: Secondary | ICD-10-CM | POA: Diagnosis present

## 2017-04-24 DIAGNOSIS — M549 Dorsalgia, unspecified: Secondary | ICD-10-CM | POA: Diagnosis present

## 2017-04-24 DIAGNOSIS — F17210 Nicotine dependence, cigarettes, uncomplicated: Secondary | ICD-10-CM | POA: Insufficient documentation

## 2017-04-24 DIAGNOSIS — R45851 Suicidal ideations: Secondary | ICD-10-CM | POA: Diagnosis present

## 2017-04-24 DIAGNOSIS — Z79899 Other long term (current) drug therapy: Secondary | ICD-10-CM

## 2017-04-24 DIAGNOSIS — K5090 Crohn's disease, unspecified, without complications: Secondary | ICD-10-CM | POA: Diagnosis present

## 2017-04-24 DIAGNOSIS — R45850 Homicidal ideations: Secondary | ICD-10-CM | POA: Diagnosis present

## 2017-04-24 DIAGNOSIS — F4310 Post-traumatic stress disorder, unspecified: Secondary | ICD-10-CM | POA: Diagnosis present

## 2017-04-24 DIAGNOSIS — F250 Schizoaffective disorder, bipolar type: Secondary | ICD-10-CM

## 2017-04-24 DIAGNOSIS — K219 Gastro-esophageal reflux disease without esophagitis: Secondary | ICD-10-CM | POA: Diagnosis present

## 2017-04-24 DIAGNOSIS — F329 Major depressive disorder, single episode, unspecified: Secondary | ICD-10-CM | POA: Diagnosis present

## 2017-04-24 DIAGNOSIS — G8929 Other chronic pain: Secondary | ICD-10-CM | POA: Diagnosis present

## 2017-04-24 DIAGNOSIS — G4700 Insomnia, unspecified: Secondary | ICD-10-CM | POA: Diagnosis present

## 2017-04-24 DIAGNOSIS — Z9049 Acquired absence of other specified parts of digestive tract: Secondary | ICD-10-CM | POA: Diagnosis not present

## 2017-04-24 DIAGNOSIS — Z886 Allergy status to analgesic agent status: Secondary | ICD-10-CM | POA: Diagnosis not present

## 2017-04-24 HISTORY — DX: Anxiety disorder, unspecified: F41.9

## 2017-04-24 HISTORY — DX: Depression, unspecified: F32.A

## 2017-04-24 HISTORY — DX: Bipolar disorder, unspecified: F31.9

## 2017-04-24 HISTORY — DX: Major depressive disorder, single episode, unspecified: F32.9

## 2017-04-24 LAB — URINE DRUG SCREEN, QUALITATIVE (ARMC ONLY)
AMPHETAMINES, UR SCREEN: NOT DETECTED
Barbiturates, Ur Screen: NOT DETECTED
Benzodiazepine, Ur Scrn: NOT DETECTED
Cannabinoid 50 Ng, Ur ~~LOC~~: NOT DETECTED
Cocaine Metabolite,Ur ~~LOC~~: NOT DETECTED
MDMA (ECSTASY) UR SCREEN: NOT DETECTED
Methadone Scn, Ur: NOT DETECTED
OPIATE, UR SCREEN: NOT DETECTED
PHENCYCLIDINE (PCP) UR S: NOT DETECTED
Tricyclic, Ur Screen: POSITIVE — AB

## 2017-04-24 LAB — CBC
HEMATOCRIT: 47.4 % (ref 40.0–52.0)
HEMOGLOBIN: 16.7 g/dL (ref 13.0–18.0)
MCH: 31.6 pg (ref 26.0–34.0)
MCHC: 35.2 g/dL (ref 32.0–36.0)
MCV: 89.7 fL (ref 80.0–100.0)
Platelets: 190 10*3/uL (ref 150–440)
RBC: 5.28 MIL/uL (ref 4.40–5.90)
RDW: 14.2 % (ref 11.5–14.5)
WBC: 8.7 10*3/uL (ref 3.8–10.6)

## 2017-04-24 LAB — COMPREHENSIVE METABOLIC PANEL
ALT: 68 U/L — AB (ref 17–63)
AST: 42 U/L — ABNORMAL HIGH (ref 15–41)
Albumin: 4.1 g/dL (ref 3.5–5.0)
Alkaline Phosphatase: 82 U/L (ref 38–126)
Anion gap: 11 (ref 5–15)
BILIRUBIN TOTAL: 0.5 mg/dL (ref 0.3–1.2)
BUN: 11 mg/dL (ref 6–20)
CO2: 24 mmol/L (ref 22–32)
Calcium: 9.4 mg/dL (ref 8.9–10.3)
Chloride: 101 mmol/L (ref 101–111)
Creatinine, Ser: 1.02 mg/dL (ref 0.61–1.24)
Glucose, Bld: 100 mg/dL — ABNORMAL HIGH (ref 65–99)
Potassium: 3.9 mmol/L (ref 3.5–5.1)
Sodium: 136 mmol/L (ref 135–145)
Total Protein: 7.7 g/dL (ref 6.5–8.1)

## 2017-04-24 LAB — ETHANOL: Alcohol, Ethyl (B): <10 mg/dL (ref <10)

## 2017-04-24 LAB — ACETAMINOPHEN LEVEL: Acetaminophen (Tylenol), Serum: <10 ug/mL — ABNORMAL LOW (ref 10–30)

## 2017-04-24 LAB — SALICYLATE LEVEL: Salicylate Lvl: <7 mg/dL (ref 2.8–30.0)

## 2017-04-24 MED ORDER — DULOXETINE HCL 30 MG PO CPEP
30.0000 mg | ORAL_CAPSULE | Freq: Two times a day (BID) | ORAL | Status: DC
  Administered 2017-04-25 – 2017-04-29 (×9): 30 mg via ORAL
  Filled 2017-04-24 (×9): qty 1

## 2017-04-24 MED ORDER — HALOPERIDOL 5 MG PO TABS: 10.0000 mg | ORAL_TABLET | Freq: Four times a day (QID) | ORAL | Status: DC | PRN

## 2017-04-24 MED ORDER — ALUM & MAG HYDROXIDE-SIMETH 200-200-20 MG/5ML PO SUSP
30.0000 mL | ORAL | Status: DC | PRN
  Administered 2017-04-26: 30 mL via ORAL
  Filled 2017-04-24: qty 30

## 2017-04-24 MED ORDER — DIVALPROEX SODIUM 500 MG PO DR TAB: 500.0000 mg | DELAYED_RELEASE_TABLET | Freq: Two times a day (BID) | ORAL | Status: DC

## 2017-04-24 MED ORDER — PANTOPRAZOLE SODIUM 40 MG PO TBEC
40.0000 mg | DELAYED_RELEASE_TABLET | Freq: Every day | ORAL | Status: DC
  Administered 2017-04-25 – 2017-04-29 (×5): 40 mg via ORAL
  Filled 2017-04-24 (×5): qty 1

## 2017-04-24 MED ORDER — DIVALPROEX SODIUM 500 MG PO DR TAB
1000.0000 mg | DELAYED_RELEASE_TABLET | Freq: Two times a day (BID) | ORAL | Status: DC
  Administered 2017-04-24: 1000 mg via ORAL
  Filled 2017-04-24: qty 2

## 2017-04-24 MED ORDER — HALOPERIDOL 5 MG PO TABS
ORAL_TABLET | ORAL | Status: AC
  Filled 2017-04-24: qty 2

## 2017-04-24 MED ORDER — HALOPERIDOL 5 MG PO TABS
10.0000 mg | ORAL_TABLET | Freq: Four times a day (QID) | ORAL | Status: DC
  Administered 2017-04-24 (×2): 10 mg via ORAL
  Filled 2017-04-24: qty 2

## 2017-04-24 MED ORDER — HYDROXYZINE HCL 50 MG PO TABS
50.0000 mg | ORAL_TABLET | Freq: Three times a day (TID) | ORAL | Status: DC | PRN
  Administered 2017-04-26: 50 mg via ORAL
  Filled 2017-04-24 (×2): qty 1

## 2017-04-24 MED ORDER — MAGNESIUM OXIDE 400 (241.3 MG) MG PO TABS
ORAL_TABLET | ORAL | Status: DC
Start: 2017-04-24 — End: 2017-04-24
  Filled 2017-04-24: qty 1

## 2017-04-24 MED ORDER — GABAPENTIN 300 MG PO CAPS
600.0000 mg | ORAL_CAPSULE | Freq: Three times a day (TID) | ORAL | Status: DC
  Administered 2017-04-25 – 2017-04-29 (×14): 600 mg via ORAL
  Filled 2017-04-24 (×14): qty 2

## 2017-04-24 MED ORDER — MAGNESIUM HYDROXIDE 400 MG/5ML PO SUSP: 30.0000 mL | Freq: Every day | ORAL | Status: DC | PRN

## 2017-04-24 MED ORDER — HALOPERIDOL 5 MG PO TABS: 10.0000 mg | ORAL_TABLET | Freq: Four times a day (QID) | ORAL | Status: DC

## 2017-04-24 MED ORDER — ACETAMINOPHEN 325 MG PO TABS
650.0000 mg | ORAL_TABLET | Freq: Four times a day (QID) | ORAL | Status: DC | PRN
  Administered 2017-04-26 – 2017-04-28 (×3): 650 mg via ORAL
  Filled 2017-04-24 (×3): qty 2

## 2017-04-24 MED ORDER — PANTOPRAZOLE SODIUM 40 MG PO TBEC
40.0000 mg | DELAYED_RELEASE_TABLET | Freq: Every day | ORAL | Status: DC
  Administered 2017-04-24: 40 mg via ORAL

## 2017-04-24 MED ORDER — GABAPENTIN 600 MG PO TABS
ORAL_TABLET | ORAL | Status: AC
  Administered 2017-04-24: 600 mg
  Filled 2017-04-24: qty 1

## 2017-04-24 MED ORDER — DIVALPROEX SODIUM 500 MG PO DR TAB
1000.0000 mg | DELAYED_RELEASE_TABLET | Freq: Two times a day (BID) | ORAL | Status: DC
  Administered 2017-04-25 – 2017-04-29 (×9): 1000 mg via ORAL
  Filled 2017-04-24 (×9): qty 2

## 2017-04-24 MED ORDER — MAGNESIUM OXIDE 400 (241.3 MG) MG PO TABS
400.0000 mg | ORAL_TABLET | Freq: Every day | ORAL | Status: DC
  Administered 2017-04-25 – 2017-04-29 (×5): 400 mg via ORAL
  Filled 2017-04-24 (×5): qty 1

## 2017-04-24 MED ORDER — RISPERIDONE 1 MG PO TABS
2.0000 mg | ORAL_TABLET | Freq: Every day | ORAL | Status: DC
  Administered 2017-04-24: 2 mg via ORAL
  Filled 2017-04-24: qty 2

## 2017-04-24 MED ORDER — PANTOPRAZOLE SODIUM 40 MG PO TBEC
DELAYED_RELEASE_TABLET | ORAL | Status: DC
Start: 2017-04-24 — End: 2017-04-24
  Filled 2017-04-24: qty 1

## 2017-04-24 MED ORDER — DULOXETINE HCL 30 MG PO CPEP
30.0000 mg | ORAL_CAPSULE | Freq: Two times a day (BID) | ORAL | Status: DC
  Administered 2017-04-24: 30 mg via ORAL
  Filled 2017-04-24: qty 1

## 2017-04-24 MED ORDER — TRAZODONE HCL 100 MG PO TABS: 100.0000 mg | ORAL_TABLET | Freq: Every evening | ORAL | Status: DC | PRN

## 2017-04-24 MED ORDER — LORAZEPAM 1 MG PO TABS
1.0000 mg | ORAL_TABLET | Freq: Once | ORAL | Status: AC
  Administered 2017-04-24: 1 mg via ORAL
  Filled 2017-04-24: qty 1

## 2017-04-24 MED ORDER — GABAPENTIN 300 MG PO CAPS
600.0000 mg | ORAL_CAPSULE | Freq: Three times a day (TID) | ORAL | Status: DC
  Administered 2017-04-24: 600 mg via ORAL
  Filled 2017-04-24: qty 2

## 2017-04-24 MED ORDER — RISPERIDONE 1 MG PO TABS: 2.0000 mg | ORAL_TABLET | Freq: Every day | ORAL | Status: DC

## 2017-04-24 MED ORDER — MAGNESIUM OXIDE 400 (241.3 MG) MG PO TABS
400.0000 mg | ORAL_TABLET | Freq: Every day | ORAL | Status: DC
  Administered 2017-04-24: 400 mg via ORAL

## 2017-04-24 NOTE — ED Notes (Signed)
Given meal tray per pt request.

## 2017-04-24 NOTE — ED Notes (Signed)
BEHAVIORAL HEALTH ROUNDING Patient sleeping: No. Patient alert and oriented: yes Behavior appropriate: Yes.  ; If no, describe:  Nutrition and fluids offered: yes Toileting and hygiene offered: Yes  Sitter present: q15 minute observations and security monitoring Law enforcement present: Yes  ODS

## 2017-04-24 NOTE — ED Notes (Signed)
Report received from Amy RN

## 2017-04-24 NOTE — ED Notes (Signed)
Patient escorted to lower level behavioral health

## 2017-04-24 NOTE — ED Notes (Signed)
Vol, patient to be inpatient admit per Dr. Weber Cooks

## 2017-04-24 NOTE — BH Assessment (Signed)
Assessment Note  Carl Martinez is an 26 y.o. male who presents with chief complaint "I'm suicidal and homicidal". He reports, "I came pretty close" -- previous SI attempt of trying to jump in front of a car. He reports current thoughts of suicide and homicide. He states he is homicidal towards his family; however, he denied identifying a particular individual. He is currently receiving mental health treatment through [facility], where he receives medication management services. He reports having depressive symptoms for the last 3 months: isolating, irritable, loss of interest in hobbies, and decreased sleep patterns. When asked to identify his current stressors, patient reported "nothing new". Pt denied past/current alcohol/substance use. He also reports having auditory/visual hallucinations - (voices and faces in the distance). Pt has Hx of previous behavioral medicine admissions for SI/HI, with 4 ED visits within the last year. He presented with a flat/blunted affect and poor eye contact.  Diagnosis: Bipolar 1 Disorder, by history  Past Medical History:  Past Medical History:  Diagnosis Date  . Anxiety   . Bipolar 1 disorder ([place])   . Crohn disease ([place])   . Depression     Past Surgical History:  Procedure Laterality Date  . CHOLECYSTECTOMY      Family History: No family history on file.  Social History:  reports that he has been smoking Cigarettes.  He has a 15.00 pack-year smoking history. He has never used smokeless tobacco. He reports that he does not drink alcohol or use drugs.  Additional Social History:  Alcohol / Drug Use Pain Medications: Pt denies abuse. Prescriptions: Pt denies abuse. Over the Counter: Pt denies abuse. History of alcohol / drug use?: No history of alcohol / drug abuse  CIWA: CIWA-Ar BP: 122/89 Pulse Rate: 100 COWS:    Allergies:  Allergies  Allergen Reactions  . Nsaids Swelling    Chrons- swell up on inside per pt    Home Medications:  (Not in a  hospital admission)  OB/GYN Status:  No LMP for male patient.  General Assessment Data Location of Assessment: [facility] ED TTS Assessment: In system Is this a Tele or Face-to-Face Assessment?: Face-to-Face Is this an Initial Assessment or a Re-assessment for this encounter?: Initial Assessment Marital status: Long term relationship Maiden name: N/A Is patient pregnant?: No Pregnancy Status: No Living Arrangements: Spouse/significant other, Other relatives Can pt return to current living arrangement?: Yes Admission Status: Involuntary Is patient capable of signing voluntary admission?: No Referral Source: Self/Family/Friend Insurance type: Medicaid  Medical Screening Exam ([place]) Medical Exam completed: Yes  Crisis Care Plan Living Arrangements: Spouse/significant other, Other relatives Legal Guardian: Other: (Self) Name of Psychiatrist: Dr. Leonides Schanz Name of Therapist: None  Education Status Is patient currently in school?: No Current Grade: N/A Highest grade of school patient has completed: 11th grade Name of school: N/A Contact person: N/A  Risk to self with the past 6 months Suicidal Ideation: Yes-Currently Present Has patient been a risk to self within the past 6 months prior to admission? : Yes Suicidal Intent: Yes-Currently Present Has patient had any suicidal intent within the past 6 months prior to admission? : Yes Is patient at risk for suicide?: Yes Suicidal Plan?: Yes-Currently Present Has patient had any suicidal plan within the past 6 months prior to admission? : Yes Specify Current Suicidal Plan: To jump in front of car Access to Means: Yes Specify Access to Suicidal Means: Access to roadways/highways What has been your use of drugs/alcohol within the last 12 months?: None Previous  Attempts/Gestures: Yes How many times?: 1 Other Self Harm Risks: None Reported Triggers for Past Attempts: Unknown Intentional Self Injurious Behavior: None Family  Suicide History: Unknown Recent stressful life event(s): Other (Comment) ("nothing new") Persecutory voices/beliefs?: No Depression: Yes Depression Symptoms: Insomnia, Isolating, Guilt, Feeling worthless/self pity, Feeling angry/irritable Substance abuse history and/or treatment for substance abuse?: No Suicide prevention information given to non-admitted patients: Yes  Risk to Others within the past 6 months Homicidal Ideation: Yes-Currently Present Does patient have any lifetime risk of violence toward others beyond the six months prior to admission? : Unknown Thoughts of Harm to Others: Yes-Currently Present Comment - Thoughts of Harm to Others: Thoughts to harm his family Current Homicidal Intent: Yes-Currently Present Current Homicidal Plan: Yes-Currently Present Describe Current Homicidal Plan: No specific plan reported Access to Homicidal Means:  Myer Haff) Identified Victim: Family History of harm to others?: No Assessment of Violence: None Noted Violent Behavior Description: None Reported Does patient have access to weapons?: No Criminal Charges Pending?: No Does patient have a court date: No Is patient on probation?: No  Psychosis Hallucinations: Auditory, Visual ("voices and faces in the distance") Delusions: None noted  Mental Status Report Appearance/Hygiene: In scrubs Eye Contact: Fair Motor Activity: Freedom of movement Speech: Logical/coherent Level of Consciousness: Alert Mood: Depressed Affect: Blunted Anxiety Level: None Thought Processes: Coherent Judgement: Unimpaired Orientation: Person, Place, Time, Situation, Appropriate for developmental age Obsessive Compulsive Thoughts/Behaviors: None  Cognitive Functioning Concentration: Normal Memory: Recent Intact, Remote Intact IQ: Average Insight: Poor Impulse Control: Fair Appetite: Good Weight Loss: 0 Weight Gain: 0 Sleep: Decreased Total Hours of Sleep: 5 ("broken sleep") Vegetative Symptoms:  None  ADLScreening [facility] Assessment Services) Patient's cognitive ability adequate to safely complete daily activities?: Yes Patient able to express need for assistance with ADLs?: Yes Independently performs ADLs?: Yes (appropriate for developmental age)  Prior Inpatient Therapy Prior Inpatient Therapy: Yes Prior Therapy Dates: 12/13/16; 12/24/16; 01/01/17; 03/18/17 Prior Therapy Facilty/Provider(s): [facility]; [facility] Reason for Treatment: SI/HI  Prior Outpatient Therapy Prior Outpatient Therapy: Yes Prior Therapy Dates: Current Prior Therapy Facilty/Provider(s): [facility] Reason for Treatment: Medication Management Does patient have an ACCT team?: No Does patient have Intensive In-House Services?  : No Does patient have [facility] services? : No Does patient have P4CC services?: No  ADL Screening (condition at time of admission) Patient's cognitive ability adequate to safely complete daily activities?: Yes Patient able to express need for assistance with ADLs?: Yes Independently performs ADLs?: Yes (appropriate for developmental age)       Abuse/Neglect Assessment (Assessment to be complete while patient is alone) Physical Abuse: Denies Verbal Abuse: Denies Sexual Abuse: Denies Exploitation of patient/patient's resources: Denies Self-Neglect: Denies Values / Beliefs Cultural Requests During Hospitalization: None Spiritual Requests During Hospitalization: None Consults Spiritual Care Consult Needed: No Social Work Consult Needed: No Regulatory affairs officer (For Healthcare) Does Patient Have a Medical Advance Directive?: No    Additional Information 1:1 In Past 12 Months?: No CIRT Risk: No Elopement Risk: No Does patient have medical clearance?: Yes  Child/Adolescent Assessment Running Away Risk: Denies (Patient is an adult)  Disposition:  Disposition Initial Assessment Completed for this Encounter: Yes Disposition of Patient: Referred to (Inpatient Admission) Patient referred to:  Other (Comment) (Inpatient admission)  On Site Evaluation by:   Reviewed with Physician:    Frederich Cha 04/24/2017 10:40 PM

## 2017-04-24 NOTE — ED Triage Notes (Signed)
Pt comes into the Ed via POV c/o homicidal and suicidal thoughts.  Patient has homicidal thoughts towards his family for 6 days and had a plan to run out I front of traffic.  Patient saw his outpatient psychiatrist today and they sent him here to be evaluated.  Patient is calm, cooperative, and polite.  Patient in NAD at this time with even and unlabored respirations.  Denies any medical complaints.

## 2017-04-24 NOTE — ED Notes (Signed)
BEHAVIORAL HEALTH ROUNDING Patient sleeping: No. Patient alert and oriented: yes Behavior appropriate: Yes.  ; If no, describe:  Nutrition and fluids offered: yes Toileting and hygiene offered: Yes  Sitter present: q15 minute observations and security monitoring Law enforcement present: Yes  ODS   ENVIRONMENTAL ASSESSMENT Potentially harmful objects out of patient reach: Yes.   Personal belongings secured: Yes.   Patient dressed in hospital provided attire only: Yes.   Plastic bags out of patient reach: Yes.   Patient care equipment (cords, cables, call bells, lines, and drains) shortened, removed, or accounted for: Yes.   Equipment and supplies removed from bottom of stretcher: Yes.   Potentially toxic materials out of patient reach: Yes.   Sharps container removed or out of patient reach: Yes.

## 2017-04-24 NOTE — Consult Note (Signed)
[facility] Consult   Reason for Consult:  Consult for 26 year old man with a history of chronic mental health problems who comes in the hospital with worsening of mood disorder and psychosis Referring Physician:  [place] Patient Identification: Carl Martinez MRN:  [id number] Principal Diagnosis: Schizoaffective disorder, bipolar type [facility]) Diagnosis:   Patient Active Problem List   Diagnosis Date Noted  . Schizoaffective disorder, bipolar type ([place]) [F25.0] 04/24/2017  . MDD (major depressive disorder), recurrent episode, moderate ([place]) [F33.1] 12/26/2016  . Chronic back pain [M54.9, G89.29] 12/25/2016  . PTSD (post-traumatic stress disorder) [F43.10] 12/18/2016  . GERD (gastroesophageal reflux disease) [K21.9] 12/18/2016  . Crohn's disease ([place]) [K50.90] 12/12/2016  . Tobacco use disorder [F17.200] 04/05/2016    Total Time spent with patient: 1 hour  Subjective:   Carl Martinez is a 25 y.o. male patient admitted with "suicidal and homicidal".  HPI:  Patient interviewed chart reviewed. This is a 26 year old man who came to the emergency room referred by his outpatient psychiatrist Dr. Leonides Schanz. Patient says for the last 6 days his psychosis has been back and he has been feeling both suicidal and homicidal. He has been having intrusive thoughts about killing himself and hurting and killing his family. He has not actually acted on any of these. He is sleeping poorly at night. Not thinking clearly. He actually denies however having hallucinations despite those having been a major symptom in the past. He says he has been compliant with all of his medicines as prescribed. Denies that he's been abusing drugs or alcohol. Can't put his finger on any recent stress that would've made things worse.  Medical history: History of chronic back pain and Crohn's disease  Substance abuse history: He smokes but denies any alcohol or drug use and it has not been a major problem in the  past.  Social history: Patient lives with his wife and has young children at home. Not able to work. Disabled.  Past Psychiatric History: Patient has been seen multiple times before both here and at other hospitals. Most recently was hospitalized at [facility] this fall. Probably hasn't been out that long. He has a long history of talking about suicidal and homicidal thoughts. Has never made a major suicide attempt. No history of homicidal or violent behavior. Has been on multiple medicines including antipsychotics and mood stabilizers with some improvement.  Risk to Self: Is patient at risk for suicide?: Yes Risk to Others:   Prior Inpatient Therapy:   Prior Outpatient Therapy:    Past Medical History:  Past Medical History:  Diagnosis Date  . Anxiety   . Bipolar 1 disorder ([place])   . Crohn disease ([place])   . Depression     Past Surgical History:  Procedure Laterality Date  . CHOLECYSTECTOMY     Family History: No family history on file. Family Psychiatric  History: Denies knowing of any family history Social History:  History  Alcohol Use No     History  Drug Use No    Social History   Social History  . Marital status: Legally Separated    Spouse name: N/A  . Number of children: N/A  . Years of education: N/A   Social History Main Topics  . Smoking status: Current Every Day Smoker    Packs/day: 1.50    Years: 10.00    Types: Cigarettes  . Smokeless tobacco: Never Used  . Alcohol use No  . Drug use: No  . Sexual activity: Yes  Birth control/ protection: None   Other Topics Concern  . None   Social History Narrative  . None   Additional Social History:    Allergies:   Allergies  Allergen Reactions  . Nsaids Swelling    Chrons- swell up on inside per pt    Labs:  Results for orders placed or performed during the hospital encounter of 04/24/17 (from the past 48 hour(s))  Comprehensive metabolic panel     Status: Abnormal   Collection Time: 04/24/17   2:02 PM  Result Value Ref Range   Sodium 136 135 - 145 mmol/L   Potassium 3.9 3.5 - 5.1 mmol/L   Chloride 101 101 - 111 mmol/L   CO2 24 22 - 32 mmol/L   Glucose, Bld 100 (H) 65 - 99 mg/dL   BUN 11 6 - 20 mg/dL   Creatinine, Ser 1.02 0.61 - 1.24 mg/dL   Calcium 9.4 8.9 - 10.3 mg/dL   Total Protein 7.7 6.5 - 8.1 g/dL   Albumin 4.1 3.5 - 5.0 g/dL   AST 42 (H) 15 - 41 U/L   ALT 68 (H) 17 - 63 U/L   Alkaline Phosphatase 82 38 - 126 U/L   Total Bilirubin 0.5 0.3 - 1.2 mg/dL   GFR calc non Af Amer >60 >60 mL/min   GFR calc Af Amer >60 >60 mL/min    Comment: (NOTE) The eGFR has been calculated using the CKD EPI equation. This calculation has not been validated in all clinical situations. eGFR's persistently <60 mL/min signify possible Chronic Kidney Disease.    Anion gap 11 5 - 15  Ethanol     Status: None   Collection Time: 04/24/17  2:02 PM  Result Value Ref Range   Alcohol, Ethyl (B) <10 <10 mg/dL    Comment:        LOWEST DETECTABLE LIMIT FOR SERUM ALCOHOL IS 10 mg/dL FOR MEDICAL PURPOSES ONLY Please note change in reference range.   Salicylate level     Status: None   Collection Time: 04/24/17  2:02 PM  Result Value Ref Range   Salicylate Lvl <7.2 2.8 - 30.0 mg/dL  Acetaminophen level     Status: Abnormal   Collection Time: 04/24/17  2:02 PM  Result Value Ref Range   Acetaminophen (Tylenol), Serum <10 (L) 10 - 30 ug/mL    Comment:        THERAPEUTIC CONCENTRATIONS VARY SIGNIFICANTLY. A RANGE OF 10-30 ug/mL MAY BE AN EFFECTIVE CONCENTRATION FOR MANY PATIENTS. HOWEVER, SOME ARE BEST TREATED AT CONCENTRATIONS OUTSIDE THIS RANGE. ACETAMINOPHEN CONCENTRATIONS >150 ug/mL AT 4 HOURS AFTER INGESTION AND >50 ug/mL AT 12 HOURS AFTER INGESTION ARE OFTEN ASSOCIATED WITH TOXIC REACTIONS.   cbc     Status: None   Collection Time: 04/24/17  2:02 PM  Result Value Ref Range   WBC 8.7 3.8 - 10.6 K/uL   RBC 5.28 4.40 - 5.90 MIL/uL   Hemoglobin 16.7 13.0 - 18.0 g/dL   HCT  47.4 40.0 - 52.0 %   MCV 89.7 80.0 - 100.0 fL   MCH 31.6 26.0 - 34.0 pg   MCHC 35.2 32.0 - 36.0 g/dL   RDW 14.2 11.5 - 14.5 %   Platelets 190 150 - 440 K/uL    Current Facility-Administered Medications  Medication Dose Route Frequency Provider Last Rate Last Dose  . divalproex (DEPAKOTE) DR tablet 1,000 mg  1,000 mg Oral Q12H Clapacs, John T, MD      . DULoxetine (CYMBALTA) DR capsule  30 mg  30 mg Oral BID Clapacs, John T, MD      . gabapentin (NEURONTIN) capsule 600 mg  600 mg Oral TID Clapacs, John T, MD      . haloperidol (HALDOL) tablet 10 mg  10 mg Oral QID Clapacs, John T, MD      . magnesium oxide (MAG-OX) tablet 400 mg  400 mg Oral Daily Clapacs, John T, MD      . pantoprazole (PROTONIX) EC tablet 40 mg  40 mg Oral Daily Clapacs, John T, MD      . risperiDONE (RISPERDAL) tablet 2 mg  2 mg Oral QHS Clapacs, Madie Reno, MD       Current Outpatient Prescriptions  Medication Sig Dispense Refill  . cyclobenzaprine (FLEXERIL) 10 MG tablet Take 1 tablet (10 mg total) by mouth 3 (three) times daily. 90 tablet 0  . divalproex (DEPAKOTE) 500 MG DR tablet Take 1 tablet (500 mg total) by mouth every 12 (twelve) hours. 60 tablet 0  . DULoxetine (CYMBALTA) 30 MG capsule Take 30 mg by mouth daily.    Marland Kitchen gabapentin (NEURONTIN) 300 MG capsule Take 2 capsules (600 mg total) by mouth 3 (three) times daily. 180 capsule 0  . magnesium oxide (MAG-OX) 400 MG tablet Take 400 mg by mouth daily.    Marland Kitchen omeprazole (PRILOSEC) 20 MG capsule Take 1 capsule (20 mg total) by mouth daily. 30 capsule 0  . risperiDONE (RISPERDAL) 1 MG tablet Take 1 tablet (1 mg total) by mouth 3 (three) times daily as needed. (Patient taking differently: Take 1 mg by mouth 2 (two) times daily as needed. ) 90 tablet 0  . FLUoxetine (PROZAC) 20 MG capsule Take 1 capsule (20 mg total) by mouth daily. (Patient not taking: Reported on 04/24/2017) 30 capsule 0  . traZODone (DESYREL) 100 MG tablet Take 2 tablets (200 mg total) by mouth at  bedtime. (Patient not taking: Reported on 04/24/2017) 60 tablet 0    Musculoskeletal: Strength & Muscle Tone: within normal limits Gait & Station: normal Patient leans: N/A  Psychiatric Specialty Exam: Physical Exam  Nursing note and vitals reviewed. Constitutional: He appears well-developed and well-nourished.  HENT:  Head: Normocephalic and atraumatic.  Eyes: Pupils are equal, round, and reactive to light. Conjunctivae are normal.  Neck: Normal range of motion.  Cardiovascular: Regular rhythm and normal heart sounds.   Respiratory: Effort normal. No respiratory distress.  GI: Soft.  Musculoskeletal: Normal range of motion.  Neurological: He is alert.  Skin: Skin is warm and dry.  Psychiatric: His affect is blunt and inappropriate. His speech is delayed. He is slowed. Cognition and memory are impaired. He expresses impulsivity and inappropriate judgment. He expresses homicidal and suicidal ideation.    Review of Systems  Constitutional: Negative.   HENT: Negative.   Eyes: Negative.   Respiratory: Negative.   Cardiovascular: Negative.   Gastrointestinal: Negative.   Musculoskeletal: Negative.   Skin: Negative.   Neurological: Negative.   Psychiatric/Behavioral: Positive for depression, memory loss and suicidal ideas. Negative for hallucinations and substance abuse. The patient is nervous/anxious and has insomnia.     Blood pressure 131/73, pulse 88, temperature 98.3 F (36.8 C), temperature source Oral, resp. rate 18, height 6' 1"  (1.854 m), weight 135.6 kg (299 lb), SpO2 97 %.Body mass index is 39.45 kg/m.  General Appearance: Fairly Groomed  Eye Contact:  Fair  Speech:  Slow  Volume:  Decreased  Mood:  Anxious and Depressed  Affect:  Flat and Inappropriate  Thought Process:  Goal Directed  Orientation:  Full (Time, Place, and Person)  Thought Content:  Illogical, Obsessions, Paranoid Ideation and Rumination  Suicidal Thoughts:  Yes.  without intent/plan  Homicidal  Thoughts:  Yes.  without intent/plan  Memory:  Immediate;   Fair Recent;   Fair Remote;   Fair  Judgement:  Impaired  Insight:  Shallow  Psychomotor Activity:  Decreased  Concentration:  Concentration: Poor  Recall:  [employer] of Knowledge:  Fair  Language:  Fair  Akathisia:  No  Handed:  Right  AIMS (if indicated):     Assets:  Desire for Improvement Housing Physical Health Resilience Social Support  ADL's:  Intact  Cognition:  Impaired,  Mild  Sleep:        Treatment Plan Summary: Daily contact with patient to assess and evaluate symptoms and progress in treatment, Medication management and Plan 26 year old man with chronic mental health problems variously diagnosed but to me most consistent with either schizophrenia or schizoaffective disorder presents with a worsening of symptoms. He is denying hallucinations but reports intrusive thoughts of hurting himself and his family. These are incongruent with his affect. They are incongruent with any logical or rational reason he can give for wanting to do these things. Has been compliant with medicine. Not abusing alcohol or drugs. Patient shows signs of worsening dangerousness and needs inpatient hospitalization. Tried to restart medicines as best I could based on recent dosages especially at [facility] this summer. Continue IVC. Patient will be admitted to the psychiatric unit when we have a bed available or can be referred out if no bed is forthcoming. Case reviewed with TTS and emergency room doctor.  Disposition: Recommend psychiatric Inpatient admission when medically cleared. Supportive therapy provided about ongoing stressors. Discussed crisis plan, support from social network, calling 911, coming to the Emergency Department, and calling Suicide Hotline.  Alethia Berthold, MD 04/24/2017 5:45 PM

## 2017-04-24 NOTE — ED Notes (Signed)
Patient waiting to go downstairs to his room. Will transport after 11pm shift change.

## 2017-04-24 NOTE — ED Provider Notes (Addendum)
[facility] Emergency Department Provider Note  ____________________________________________   First MD Initiated Contact with Patient 04/24/17 1542     (approximate)  I have reviewed the triage vital signs and the nursing notes.   HISTORY  Chief Complaint Homicidal and Suicidal   HPI Carl Martinez is a 26 y.o. male who self presents the emergency department requesting to speak to a psychiatrist. He has a long-standing history of schizoaffective disorder and he reports compliance with his medications. He takes Depakote, haloperidol, gabapentin, as well as Risperdal. He says that slowly over the past week or so he has become more anxious more depressed and he feels the urge to hurt other people and to hurt himself. He has not actually attempted suicide nor has he hurt anyone else. He said his symptoms began insidiously and has been slowly progressive. They're worse with stress and nothing seems to make them better.   Past Medical History:  Diagnosis Date  . Anxiety   . Bipolar 1 disorder ([place])   . Crohn disease ([place])   . Depression     Patient Active Problem List   Diagnosis Date Noted  . Schizoaffective disorder, bipolar type ([place]) 04/24/2017  . MDD (major depressive disorder), recurrent episode, moderate ([place]) 12/26/2016  . Chronic back pain 12/25/2016  . PTSD (post-traumatic stress disorder) 12/18/2016  . GERD (gastroesophageal reflux disease) 12/18/2016  . Crohn's disease ([place]) 12/12/2016  . Tobacco use disorder 04/05/2016    Past Surgical History:  Procedure Laterality Date  . CHOLECYSTECTOMY      Prior to Admission medications   Medication Sig Start Date End Date Taking? Authorizing Provider  cyclobenzaprine (FLEXERIL) 10 MG tablet Take 1 tablet (10 mg total) by mouth 3 (three) times daily. 12/18/16  Yes Pucilowska, Jolanta B, MD  divalproex (DEPAKOTE) 500 MG DR tablet Take 1 tablet (500 mg total) by mouth every 12 (twelve) hours.  12/27/16  Yes Hildred Priest, MD  DULoxetine (CYMBALTA) 30 MG capsule Take 30 mg by mouth daily.   Yes [provider]  gabapentin (NEURONTIN) 300 MG capsule Take 2 capsules (600 mg total) by mouth 3 (three) times daily. 12/18/16  Yes Pucilowska, Jolanta B, MD  magnesium oxide (MAG-OX) 400 MG tablet Take 400 mg by mouth daily.   Yes [provider]  omeprazole (PRILOSEC) 20 MG capsule Take 1 capsule (20 mg total) by mouth daily. 12/18/16  Yes Pucilowska, Jolanta B, MD  risperiDONE (RISPERDAL) 1 MG tablet Take 1 tablet (1 mg total) by mouth 3 (three) times daily as needed. Patient taking differently: Take 1 mg by mouth 2 (two) times daily as needed.  12/27/16  Yes Hildred Priest, MD  FLUoxetine (PROZAC) 20 MG capsule Take 1 capsule (20 mg total) by mouth daily. Patient not taking: Reported on 04/24/2017 12/28/16   Hildred Priest, MD  traZODone (DESYREL) 100 MG tablet Take 2 tablets (200 mg total) by mouth at bedtime. Patient not taking: Reported on 04/24/2017 12/27/16   Hildred Priest, MD    Allergies Nsaids  No family history on file.  Social History Social History  Substance Use Topics  . Smoking status: Current Every Day Smoker    Packs/day: 1.50    Years: 10.00    Types: Cigarettes  . Smokeless tobacco: Never Used  . Alcohol use No    Review of Systems Constitutional: No fever/chills Eyes: No visual changes. ENT: No sore throat. Cardiovascular: Denies chest pain. Respiratory: Denies shortness of breath. Gastrointestinal: No abdominal pain.  No  nausea, no vomiting.  No diarrhea.  No constipation. Genitourinary: Negative for dysuria. Musculoskeletal: Negative for back pain. Skin: Negative for rash. Neurological: Negative for headaches, focal weakness or numbness.   ____________________________________________   PHYSICAL EXAM:  VITAL SIGNS: ED Triage Vitals [04/24/17 1403]  Enc Vitals Group     BP 111/77      Pulse Rate 99     Resp 17     Temp 98.7 F (37.1 C)     Temp Source Oral     SpO2 100 %     Weight 299 lb (135.6 kg)     Height 6' 1"  (1.854 m)     Head Circumference      Peak Flow      Pain Score      Pain Loc      Pain Edu?      Excl. in [place]?     Constitutional: alert and oriented 4 pleasant and cooperative somewhat strange affect Eyes: PERRL EOMI. Pupils 17m and brisk bilaterally Head: Atraumatic. Nose: No congestion/rhinnorhea. Mouth/Throat: No trismus Neck: No stridor.   Cardiovascular: Normal rate, regular rhythm. Grossly normal heart sounds.  Good peripheral circulation. Respiratory: Normal respiratory effort.  No retractions. Lungs CTAB and moving good air Gastrointestinal: soft nontender Musculoskeletal: No lower extremity edema   Neurologic:  Normal speech and language. No gross focal neurologic deficits are appreciated. Skin:  Skin is warm, dry and intact. No rash noted. Psychiatric: strange and sad affect    ____________________________________________   DIFFERENTIAL includes but not limited to  schizophrenia, schizoaffective disorder, major depressive disorder, suicidal ideation, drug overdose, metabolic derangement ____________________________________________   LABS (all labs ordered are listed, but only abnormal results are displayed)  Labs Reviewed  COMPREHENSIVE METABOLIC PANEL - Abnormal; Notable for the following:       Result Value   Glucose, Bld 100 (*)    AST 42 (*)    ALT 68 (*)    All other components within normal limits  ACETAMINOPHEN LEVEL - Abnormal; Notable for the following:    Acetaminophen (Tylenol), Serum <10 (*)    All other components within normal limits  URINE DRUG SCREEN, QUALITATIVE ([facility] ONLY) - Abnormal; Notable for the following:    Tricyclic, Ur Screen POSITIVE (*)    All other components within normal limits  ETHANOL  SALICYLATE LEVEL  CBC    blood work reviewed and interpreted by me shows no acute  disease __________________________________________  EKG   ____________________________________________  RADIOLOGY   ____________________________________________   PROCEDURES  Procedure(s) performed: no  Procedures  Critical Care performed: no  Observation: no ____________________________________________   INITIAL IMPRESSION / ASSESSMENT AND PLAN / ED COURSE  Pertinent labs & imaging results that were available during my care of the patient were reviewed by me and considered in my medical decision making (see chart for details).  The patient is calm and cooperative and agrees to stay. His history is concerning for a gradual decompensation of his mental status. He remains voluntary at this time and we will consult psychiatry. He is requesting "something for my anxiety" so I will give him 1 mg of lorazepam orally.    ----------------------------------------- 4:34 PM on 04/24/2017 -----------------------------------------  The patient's blood work is back and unremarkable. At this point he is medically stable for psychiatric evaluation.  ____________________________________________ ----------------------------------------- 7:00 PM on 04/24/2017 -----------------------------------------  Dr. CWeber Cooksrecommends inpatient admission.  FINAL CLINICAL IMPRESSION(S) / ED DIAGNOSES  Final diagnoses:  Suicidal ideation  Homicidal ideation  NEW MEDICATIONS STARTED DURING THIS VISIT:  New Prescriptions   No medications on file     Note:  This document was prepared using Dragon voice recognition software and may include unintentional dictation errors.     Darel Hong, MD 04/24/17 9983    Darel Hong, MD 04/24/17 1901

## 2017-04-25 DIAGNOSIS — F250 Schizoaffective disorder, bipolar type: Principal | ICD-10-CM

## 2017-04-25 LAB — HEMOGLOBIN A1C
Hgb A1c MFr Bld: 5.2 % (ref 4.8–5.6)
MEAN PLASMA GLUCOSE: 102.54 mg/dL

## 2017-04-25 LAB — VALPROIC ACID LEVEL: VALPROIC ACID LVL: 51 ug/mL (ref 50.0–100.0)

## 2017-04-25 LAB — LIPID PANEL
CHOLESTEROL: 263 mg/dL — AB (ref 0–200)
HDL: 31 mg/dL — ABNORMAL LOW (ref >40)
LDL Cholesterol: 153 mg/dL — ABNORMAL HIGH (ref 0–99)
TRIGLYCERIDES: 396 mg/dL — AB (ref <150)
Total CHOL/HDL Ratio: 8.5 RATIO
VLDL: 79 mg/dL — AB (ref 0–40)

## 2017-04-25 LAB — TSH: TSH: 4.367 u[IU]/mL (ref 0.350–4.500)

## 2017-04-25 MED ORDER — RISPERIDONE 1 MG PO TABS
3.0000 mg | ORAL_TABLET | Freq: Two times a day (BID) | ORAL | Status: DC
  Administered 2017-04-25 – 2017-04-29 (×9): 3 mg via ORAL
  Filled 2017-04-25 (×9): qty 3

## 2017-04-25 MED ORDER — FLUVOXAMINE MALEATE 50 MG PO TABS
50.0000 mg | ORAL_TABLET | Freq: Every day | ORAL | Status: DC
  Administered 2017-04-25: 50 mg via ORAL
  Filled 2017-04-25: qty 1

## 2017-04-25 MED ORDER — ZOLPIDEM TARTRATE 5 MG PO TABS
10.0000 mg | ORAL_TABLET | Freq: Every day | ORAL | Status: DC
  Administered 2017-04-25 – 2017-04-27 (×3): 10 mg via ORAL
  Filled 2017-04-25 (×3): qty 2

## 2017-04-25 MED ORDER — NICOTINE 21 MG/24HR TD PT24
21.0000 mg | MEDICATED_PATCH | Freq: Every day | TRANSDERMAL | Status: DC
  Administered 2017-04-25 – 2017-04-29 (×5): 21 mg via TRANSDERMAL
  Filled 2017-04-25 (×5): qty 1

## 2017-04-25 NOTE — BHH Group Notes (Signed)
[facility] LCSW Group Therapy Note  Date/Time: 04/25/17, 1300  Type of Therapy/Topic:  Group Therapy:  Balance in Life  Participation Level: Did not attend   Description of Group:    This group will address the concept of balance and how it feels and looks when one is unbalanced. Patients will be encouraged to process areas in their lives that are out of balance, and identify reasons for remaining unbalanced. Facilitators will guide patients utilizing problem- solving interventions to address and correct the stressor making their life unbalanced. Understanding and applying boundaries will be explored and addressed for obtaining  and maintaining a balanced life. Patients will be encouraged to explore ways to assertively make their unbalanced needs known to significant others in their lives, using other group members and facilitator for support and feedback.  Therapeutic Goals: 1. Patient will identify two or more emotions or situations they have that consume much of in their lives. 2. Patient will identify signs/triggers that life has become out of balance:  3. Patient will identify two ways to set boundaries in order to achieve balance in their lives:  4. Patient will demonstrate ability to communicate their needs through discussion and/or role plays  Summary of Patient Progress:          Therapeutic Modalities:   Cognitive Behavioral Therapy Solution-Focused Therapy Assertiveness Training  Lurline Idol, LCSW
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
## 2017-04-25 NOTE — BHH Group Notes (Signed)
LCSW Group Therapy Note 04/25/2017 9:00am  Type of Therapy and Topic:  Group Therapy:  Setting Goals  Participation Level:  Did Not Attend  Description of Group: In this process group, patients discussed using strengths to work toward goals and address challenges.  Patients identified two positive things about themselves and one goal they were working on.  Patients were given the opportunity to share openly and support each other's plan for self-empowerment.  The group discussed the value of gratitude and were encouraged to have a daily reflection of positive characteristics or circumstances.  Patients were encouraged to identify a plan to utilize their strengths to work on current challenges and goals.  Therapeutic Goals 1. Patient will verbalize personal strengths/positive qualities and relate how these can assist with achieving desired personal goals 2. Patients will verbalize affirmation of peers plans for personal change and goal setting 3. Patients will explore the value of gratitude and positive focus as related to successful achievement of goals 4. Patients will verbalize a plan for regular reinforcement of personal positive qualities and circumstances.   Therapeutic Modalities Cognitive Behavioral Therapy Motivational Interviewing    Gladstone Lighter, LCSW 04/25/2017 12:37 PM
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
## 2017-04-25 NOTE — [facility] Suicide Risk Assessment (Signed)
[facility] INPATIENT:  Family/Significant Other Suicide Prevention Education  Suicide Prevention Education:  Patient Refusal for Family/Significant Other Suicide Prevention Education: The patient Carl Martinez has refused to provide written consent for family/significant other to be provided Family/Significant Other Suicide Prevention Education during admission and/or prior to discharge.  Physician notified.  Gladstone Lighter 04/25/2017, 10:12 AM

## 2017-04-25 NOTE — Plan of Care (Signed)
Problem: Activity: Goal: Interest or engagement in activities will improve Outcome: Progressing Attending unit programing  Goal: Sleeping patterns will improve Outcome: Progressing Voice no concerns around sleep  Or wake time   Problem: Education: Goal: Knowledge of [place] [facility] Education information/materials will improve Outcome: Progressing  Verbalize information  Given  Understood  Goal: Emotional status will improve Outcome: Progressing Working on coping skills  Goal: Mental status will improve Attending  Unit programing  Goal: Verbalization of understanding the information provided will improve Outcome: Progressing Verbalize understanding of information given  Problem: Coping: Goal: Ability to verbalize frustrations and anger appropriately will improve Outcome: Progressing  No anger outburst or emotional  Goal: Ability to demonstrate self-control will improve Outcome: Progressing No anger outburst or emotional    Problem: Safety: Goal: Periods of time without injury will increase Outcome: Progressing No injuries  This admission   Problem: Coping: Goal: Ability to verbalize feelings will improve Outcome: Progressing Working on coping skills   Problem: Self-Concept: Goal: Ability to identify factors that promote anxiety will improve Outcome: Progressing Working  On coping skills  Goal: Level of anxiety will decrease Outcome: Progressing Working  On coping skills  Goal: Ability to modify response to factors that promote anxiety will improve Outcome: Progressing Working on Radiographer, therapeutic

## 2017-04-25 NOTE — Tx Team (Signed)
Initial Treatment Plan 04/25/2017 12:31 AM Rosine Beat [id number]    PATIENT STRESSORS: Marital or family conflict   PATIENT STRENGTHS: Active sense of humor Communication skills Special hobby/interest Supportive family/friends   PATIENT IDENTIFIED PROBLEMS: Depression  Anxiety  Anger                 DISCHARGE CRITERIA:  Improved stabilization in mood, thinking, and/or behavior Motivation to continue treatment in a less acute level of care Reduction of life-threatening or endangering symptoms to within safe limits  PRELIMINARY DISCHARGE PLAN: Outpatient therapy Return to previous living arrangement  PATIENT/FAMILY INVOLVEMENT: This treatment plan has been presented to and reviewed with the patient, Carl Martinez.  The patient and family have been given the opportunity to ask questions and make suggestions.  Reino Kent, RN 04/25/2017, 12:31 AM

## 2017-04-25 NOTE — [facility] Group Notes (Signed)
[facility] Group Notes:  (Nursing/MHT/Case Management/Adjunct)  Date:  04/25/2017  Time:  10:39 PM  Type of Therapy:  Evening Wrap-up Group  Participation Level:  Active  Participation Quality:  Appropriate and Attentive  Affect:  Appropriate  Cognitive:  Alert and Appropriate  Insight:  Appropriate, Good and Improving  Engagement in Group:  Developing/Improving and Engaged  Modes of Intervention:  Discussion  Summary of Progress/Problems:  Carl Martinez 04/25/2017, 10:39 PM

## 2017-04-25 NOTE — Progress Notes (Signed)
D- Lum Babe 26 y.o Male presented to [facility] via ED with c/o SI, HI. Pt Dx: Schizoaffective disorder, depression and anxiety. Pt was admitted due to having homicidal thoughts for the last 6 days against his family. Pt denies SI, HI A/V hallucinations at this time. Pt contracted to safety. Pt denies suicide attempts but had a plan to run into traffic. Pt has been admitted several times with in the last 6 months.   A- Skin assessment completed with 2nd nurse (ABI RN), no abnormal marks noted, skin is warm and dry and intact. Pt does have multiple tattoos on body. Contraband search was completed, no contraband found. POC and unit policies explained, pt verbalized understanding. Consent forms obtained. Food and fluids offered to pt, pt only requested fluids and it was given to pt.   R- Pt was encouraged to contact staff for any questions or concerns.

## 2017-04-26 MED ORDER — ARIPIPRAZOLE 10 MG PO TABS
10.0000 mg | ORAL_TABLET | Freq: Every day | ORAL | Status: DC
  Administered 2017-04-26 – 2017-04-29 (×4): 10 mg via ORAL
  Filled 2017-04-26 (×4): qty 1

## 2017-04-26 MED ORDER — ARIPIPRAZOLE ER 400 MG IM SRER
400.0000 mg | INTRAMUSCULAR | Status: DC
  Administered 2017-04-27: 400 mg via INTRAMUSCULAR
  Filled 2017-04-26 (×2): qty 400

## 2017-04-26 MED ORDER — FLUVOXAMINE MALEATE 50 MG PO TABS
100.0000 mg | ORAL_TABLET | Freq: Every day | ORAL | Status: DC
  Administered 2017-04-26 – 2017-04-28 (×3): 100 mg via ORAL
  Filled 2017-04-26 (×3): qty 2

## 2017-04-26 NOTE — Plan of Care (Signed)
Problem: Activity: Goal: Sleeping patterns will improve Outcome: Progressing Patient slept for Estimated Hours of 7.30; Precautionary checks every 15 minutes for safety maintained, room free of safety hazards, patient sustains no injury or falls during this shift.

## 2017-04-26 NOTE — Plan of Care (Signed)
Problem: Activity: Goal: Interest or engagement in activities will improve Outcome: Progressing Patient to be encouraged to attend groups Goal: Sleeping patterns will improve Outcome: Progressing Patient states he slept well last night
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
## 2017-04-26 NOTE — Tx Team (Signed)
Interdisciplinary Treatment and Diagnostic Plan Update  04/26/2017 Time of Session: [place] MRN: [id number]  Principal Diagnosis: Schizoaffective disorder, bipolar type [facility])  Secondary Diagnoses: Principal Problem:   Schizoaffective disorder, bipolar type ([place]) Active Problems:   Tobacco use disorder   Crohn's disease ([place])   PTSD (post-traumatic stress disorder)   GERD (gastroesophageal reflux disease)   Current Medications:  Current Facility-Administered Medications  Medication Dose Route Frequency Provider Last Rate Last Dose  . acetaminophen (TYLENOL) tablet 650 mg  650 mg Oral Q6H PRN Clapacs, John T, MD      . alum & mag hydroxide-simeth (MAALOX/MYLANTA) 200-200-20 MG/5ML suspension 30 mL  30 mL Oral Q4H PRN Clapacs, John T, MD      . ARIPiprazole (ABILIFY) tablet 10 mg  10 mg Oral Daily Pucilowska, Jolanta B, MD      . Derrill Memo ON 04/27/2017] ARIPiprazole ER SRER 400 mg  400 mg Intramuscular Q28 days Pucilowska, Jolanta B, MD      . divalproex (DEPAKOTE) DR tablet 1,000 mg  1,000 mg Oral Q12H Clapacs, John T, MD   1,000 mg at 04/26/17 0845  . DULoxetine (CYMBALTA) DR capsule 30 mg  30 mg Oral BID Clapacs, Madie Reno, MD   30 mg at 04/26/17 0845  . fluvoxaMINE (LUVOX) tablet 100 mg  100 mg Oral QHS Pucilowska, Jolanta B, MD      . gabapentin (NEURONTIN) capsule 600 mg  600 mg Oral TID Clapacs, John T, MD   600 mg at 04/26/17 1217  . hydrOXYzine (ATARAX/VISTARIL) tablet 50 mg  50 mg Oral TID PRN Clapacs, Madie Reno, MD      . magnesium hydroxide (MILK OF MAGNESIA) suspension 30 mL  30 mL Oral Daily PRN Clapacs, John T, MD      . magnesium oxide (MAG-OX) tablet 400 mg  400 mg Oral Daily Clapacs, Madie Reno, MD   400 mg at 04/26/17 0845  . nicotine (NICODERM CQ - dosed in mg/24 hours) patch 21 mg  21 mg Transdermal Daily Pucilowska, Jolanta B, MD   21 mg at 04/26/17 0845  . pantoprazole (PROTONIX) EC tablet 40 mg  40 mg Oral Daily Clapacs, Madie Reno, MD   40 mg at 04/26/17 0845  .  risperiDONE (RISPERDAL) tablet 3 mg  3 mg Oral BID Pucilowska, Jolanta B, MD   3 mg at 04/26/17 0845  . zolpidem (AMBIEN) tablet 10 mg  10 mg Oral QHS Pucilowska, Jolanta B, MD   10 mg at 04/25/17 2100   PTA Medications: Prescriptions Prior to Admission  Medication Sig Dispense Refill Last Dose  . cyclobenzaprine (FLEXERIL) 10 MG tablet Take 1 tablet (10 mg total) by mouth 3 (three) times daily. 90 tablet 0 04/23/2017 at Unknown time  . divalproex (DEPAKOTE) 500 MG DR tablet Take 1 tablet (500 mg total) by mouth every 12 (twelve) hours. 60 tablet 0 04/24/2017 at Unknown time  . DULoxetine (CYMBALTA) 30 MG capsule Take 30 mg by mouth daily.   04/24/2017 at Unknown time  . gabapentin (NEURONTIN) 300 MG capsule Take 2 capsules (600 mg total) by mouth 3 (three) times daily. 180 capsule 0 04/24/2017 at Unknown time  . magnesium oxide (MAG-OX) 400 MG tablet Take 400 mg by mouth daily.   04/24/2017 at Unknown time  . omeprazole (PRILOSEC) 20 MG capsule Take 1 capsule (20 mg total) by mouth daily. 30 capsule 0 04/24/2017 at Unknown time  . risperiDONE (RISPERDAL) 1 MG tablet Take 1 tablet (1 mg total) by  mouth 3 (three) times daily as needed. (Patient taking differently: Take 1 mg by mouth 2 (two) times daily as needed. ) 90 tablet 0 04/24/2017 at Unknown time  . FLUoxetine (PROZAC) 20 MG capsule Take 1 capsule (20 mg total) by mouth daily. (Patient not taking: Reported on 04/24/2017) 30 capsule 0 Not Taking at Unknown time  . traZODone (DESYREL) 100 MG tablet Take 2 tablets (200 mg total) by mouth at bedtime. (Patient not taking: Reported on 04/24/2017) 60 tablet 0 Not Taking at Unknown time    Patient Stressors: Marital or family conflict  Patient Strengths: Active sense of humor Communication skills Special hobby/interest Supportive family/friends  Treatment Modalities: Medication Management, Group therapy, Case management,  1 to 1 session with clinician, Psychoeducation, Recreational  therapy.   Physician Treatment Plan for Primary Diagnosis: Schizoaffective disorder, bipolar type ([place]) Long Term Goal(s): Improvement in symptoms so as ready for discharge Improvement in symptoms so as ready for discharge   Short Term Goals: Ability to identify changes in lifestyle to reduce recurrence of condition will improve Ability to verbalize feelings will improve Ability to disclose and discuss suicidal ideas Ability to demonstrate self-control will improve Ability to identify and develop effective coping behaviors will improve Ability to maintain clinical measurements within normal limits will improve Ability to identify triggers associated with substance abuse/mental health issues will improve Ability to identify changes in lifestyle to reduce recurrence of condition will improve Ability to disclose and discuss suicidal ideas Ability to demonstrate self-control will improve Ability to identify triggers associated with substance abuse/mental health issues will improve  Medication Management: Evaluate patient's response, side effects, and tolerance of medication regimen.  Therapeutic Interventions: 1 to 1 sessions, Unit Group sessions and Medication administration.  Evaluation of Outcomes: Not Met  Physician Treatment Plan for Secondary Diagnosis: Principal Problem:   Schizoaffective disorder, bipolar type ([place]) Active Problems:   Tobacco use disorder   Crohn's disease ([place])   PTSD (post-traumatic stress disorder)   GERD (gastroesophageal reflux disease)  Long Term Goal(s): Improvement in symptoms so as ready for discharge Improvement in symptoms so as ready for discharge   Short Term Goals: Ability to identify changes in lifestyle to reduce recurrence of condition will improve Ability to verbalize feelings will improve Ability to disclose and discuss suicidal ideas Ability to demonstrate self-control will improve Ability to identify and develop effective coping  behaviors will improve Ability to maintain clinical measurements within normal limits will improve Ability to identify triggers associated with substance abuse/mental health issues will improve Ability to identify changes in lifestyle to reduce recurrence of condition will improve Ability to disclose and discuss suicidal ideas Ability to demonstrate self-control will improve Ability to identify triggers associated with substance abuse/mental health issues will improve     Medication Management: Evaluate patient's response, side effects, and tolerance of medication regimen.  Therapeutic Interventions: 1 to 1 sessions, Unit Group sessions and Medication administration.  Evaluation of Outcomes: Not Met   RN Treatment Plan for Primary Diagnosis: Schizoaffective disorder, bipolar type ([place]) Long Term Goal(s): Knowledge of disease and therapeutic regimen to maintain health will improve  Short Term Goals: Ability to identify and develop effective coping behaviors will improve and Compliance with prescribed medications will improve  Medication Management: RN will administer medications as ordered by provider, will assess and evaluate patient's response and provide education to patient for prescribed medication. RN will report any adverse and/or side effects to prescribing provider.  Therapeutic Interventions: 1 on 1 counseling sessions, Psychoeducation, Medication  administration, Evaluate responses to treatment, Monitor vital signs and CBGs as ordered, Perform/monitor CIWA, COWS, AIMS and Fall Risk screenings as ordered, Perform wound care treatments as ordered.  Evaluation of Outcomes: Not Met   LCSW Treatment Plan for Primary Diagnosis: Schizoaffective disorder, bipolar type ([place]) Long Term Goal(s): Safe transition to appropriate next level of care at discharge, Engage patient in therapeutic group addressing interpersonal concerns.  Short Term Goals: Engage patient in aftercare planning with  referrals and resources and Increase skills for wellness and recovery  Therapeutic Interventions: Assess for all discharge needs, 1 to 1 time with Social worker, Explore available resources and support systems, Assess for adequacy in community support network, Educate family and significant other(s) on suicide prevention, Complete Psychosocial Assessment, Interpersonal group therapy.  Evaluation of Outcomes: Not Met   Progress in Treatment: Attending groups: No. Participating in groups: No. Taking medication as prescribed: Yes. Toleration medication: Yes. Family/Significant other contact made: No, will contact:  pt refused Patient understands diagnosis: Yes. Discussing patient identified problems/goals with staff: Yes. Medical problems stabilized or resolved: Yes. Denies suicidal/homicidal ideation: Yes. Issues/concerns per patient self-inventory: No. Other: none  New problem(s) identified: No, Describe:  none  New Short Term/Long Term Goal(s): Pt goal: To not have to come back to the hospital.  My depakote is not working."  Discharge Plan or Barriers: Pt will return to outpt treatment at [facility].  Reason for Continuation of Hospitalization: Medication stabilization  Estimated Length of Stay:3-5 days.  Attendees: Patient:Carl Martinez 04/26/2017   Physician: Dr. Bary Leriche, MD 04/26/2017  Nursing: Elige Radon, RN 04/26/2017  RN Care Manager: 04/26/2017   Social Worker: Lurline Idol, LCSW 04/26/2017   Recreational Therapist:  04/26/2017   Other:  04/26/2017   Other:  04/26/2017   Other: 04/26/2017        Scribe for Treatment Team: [name], [place] 04/26/2017 2:12 PM

## 2017-04-26 NOTE — Progress Notes (Signed)
Patient ID: GUILLAUME WENINGER, male   DOB: 01/23/1991, 26 y.o.   MRN: [id number] Visitation by family (Pamela-Mom and Kim-GF) went well according to patient, mood and affect slightly better, no temper tantrums, denied pain, denied SI/HI, attended the wrap up group, complied with medication as prescribed; room closer to the nurses station for frequent monitoring.

## 2017-04-26 NOTE — [facility] Group Notes (Signed)
[place] LCSW Group Therapy Note  Date/Time: 04/26/17, 1300  Type of Therapy and Topic:  Group Therapy:  Feelings around Relapse and Recovery  Participation Level:  Did Not Attend   Mood:  Description of Group:    Patients in this group will discuss emotions they experience before and after a relapse. They will process how experiencing these feelings, or avoidance of experiencing them, relates to having a relapse. Facilitator will guide patients to explore emotions they have related to recovery. Patients will be encouraged to process which emotions are more powerful. They will be guided to discuss the emotional reaction significant others in their lives may have to patients' relapse or recovery. Patients will be assisted in exploring ways to respond to the emotions of others without this contributing to a relapse.  Therapeutic Goals: 1. Patient will identify two or more emotions that lead to relapse for them:  2. Patient will identify two emotions that result when they relapse:  3. Patient will identify two emotions related to recovery:  4. Patient will demonstrate ability to communicate their needs through discussion and/or role plays.   Summary of Patient Progress:     Therapeutic Modalities:   Cognitive Behavioral Therapy Solution-Focused Therapy Assertiveness Training Relapse Prevention Therapy  Lurline Idol, LCSW

## 2017-04-26 NOTE — Progress Notes (Signed)
Patient up on the unit. He is flat and only answers simple questions. He is med compliant. He denies si, hi, avh. He denies pain. No signs of distress noted. Will continue to monitor.

## 2017-04-27 MED ORDER — HYDROXYZINE HCL 50 MG PO TABS
50.0000 mg | ORAL_TABLET | Freq: Every day | ORAL | Status: DC
  Administered 2017-04-27 – 2017-04-28 (×2): 50 mg via ORAL
  Filled 2017-04-27 (×2): qty 1

## 2017-04-27 NOTE — Progress Notes (Signed)
Was visible in the milieu at times, pleasant on contact. Denies SI,  HI or AVH. Denies SI. Was med compliant. Stated he id anxious and requested vistiril. At 2350 complained of poor sleep. Stated the [place] no longer works for him. Resting at this time. No physical complaints. Remains on routine obs for safety.

## 2017-04-27 NOTE — Progress Notes (Signed)
2200 Visible in the milieu, isolative at times. Endorses depression and clo 6/10 headache. Was med compliant. At 2300 appears to be sleeping. Remains on routine obs for safety.
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
## 2017-04-27 NOTE — Plan of Care (Signed)
Problem: Activity: Goal: Interest or engagement in activities will improve Outcome: Progressing Patient encouraged to attend groups

## 2017-04-27 NOTE — [facility] Group Notes (Signed)
LCSW Group Therapy Note  04/27/2017 1:00pm  Type of Therapy and Topic:  Group Therapy:  Cognitive Distortions  Participation Level:  Did Not Attend   Description of Group:    Patients in this group will be introduced to the topic of cognitive distortions.  Patients will identify and describe cognitive distortions, describe the feelings these distortions create for them.  Patients will identify one or more situations in their personal life where they have cognitively distorted thinking and will verbalize challenging this cognitive distortion through positive thinking skills.  Patients will practice the skill of using positive affirmations to challenge cognitive distortions using affirmation cards.    Therapeutic Goals:  1. Patient will identify two or more cognitive distortions they have used 2. Patient will identify one or more emotions that stem from use of a cognitive distortion 3. Patient will demonstrate use of a positive affirmation to counter a cognitive distortion through discussion and/or role play. 4. Patient will describe one way cognitive distortions can be detrimental to wellness   Summary of Patient Progress:     Therapeutic Modalities:   Cognitive [place], LCSW 04/27/2017 2:04 PM

## 2017-04-27 NOTE — [facility] Group Notes (Signed)
[place] Group Notes:  (Nursing/MHT/Case Management/Adjunct)  Date:  04/27/2017  Time:  11:02 PM  Type of Therapy:  Evening Wrap-up Group  Participation Level:  Active  Participation Quality:  Appropriate and Attentive  Affect:  Appropriate  Cognitive:  Alert and Appropriate  Insight:  Appropriate, Good and Improving  Engagement in Group:  Developing/Improving and Engaged  Modes of Intervention:  Discussion  Summary of Progress/Problems:  Carl Martinez 04/27/2017, 11:02 PM

## 2017-04-28 MED ORDER — TRAZODONE HCL 100 MG PO TABS
200.0000 mg | ORAL_TABLET | Freq: Every day | ORAL | Status: DC
  Administered 2017-04-28: 200 mg via ORAL
  Filled 2017-04-28: qty 2

## 2017-04-28 MED ORDER — ZOLPIDEM TARTRATE 5 MG PO TABS
10.0000 mg | ORAL_TABLET | Freq: Every evening | ORAL | Status: DC | PRN
  Administered 2017-04-28: 10 mg via ORAL
  Filled 2017-04-28: qty 2

## 2017-04-28 NOTE — Plan of Care (Signed)
Problem: Activity: Goal: Sleeping patterns will improve Outcome: Not Progressing Patient states he is not sleeping well  Problem: Education: Goal: Emotional status will improve Outcome: Progressing Patient is cooperative and appropriate with staff

## 2017-04-28 NOTE — Progress Notes (Signed)
Has been social with peers and visible in the milieu. Continues to focus on his meds and ability to sleep well. States his heart races when he lays down. HR was 80 at that time. Denies all psych symptoms except anxiety rated 6/10Also states he is "mentally tired" from not sleeping well. No physical complaints, remains on routine obs for safety.

## 2017-04-28 NOTE — Progress Notes (Signed)
Patient is up on the unit social with peers. He is med compliant. He feels like since he started the abilify he is having trouple sleeping. He is flat and blunted. He answers appropriately and denies si, hi,a vh. Denies pain. Will continue to monitor.

## 2017-04-28 NOTE — [facility] Group Notes (Signed)
LCSW Group Therapy Note  04/28/2017 2:45pm  Type of Therapy/Topic:  Group Therapy:  Balance in Life  Participation Level:  Did Not Attend  Description of Group:   This group will address the concept of balance and how it feels and looks when one is unbalanced. Patients will be encouraged to process areas in their lives that are out of balance and identify reasons for remaining unbalanced. Facilitators will guide patients in utilizing problem-solving interventions to address and correct the stressor making their life unbalanced. Understanding and applying boundaries will be explored and addressed for obtaining and maintaining a balanced life. Patients will be encouraged to explore ways to assertively make their unbalanced needs known to significant others in their lives, using other group members and facilitator for support and feedback.  Therapeutic Goals: 1. Patient will identify two or more emotions or situations they have that consume much of in their lives. 2. Patient will identify signs/triggers that life has become out of balance:  3. Patient will identify two ways to set boundaries in order to achieve balance in their lives:  4. Patient will demonstrate ability to communicate their needs through discussion and/or role plays  Summary of Patient Progress:       Therapeutic Modalities:   Cognitive [place], LCSW 04/28/2017 11:05 AM
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
## 2017-04-29 MED ORDER — GABAPENTIN 300 MG PO CAPS: 600.0000 mg | ORAL_CAPSULE | Freq: Three times a day (TID) | ORAL | 1 refills | Status: AC

## 2017-04-29 MED ORDER — TRAZODONE HCL 100 MG PO TABS: 200.0000 mg | ORAL_TABLET | Freq: Every day | ORAL | 1 refills | Status: AC

## 2017-04-29 MED ORDER — ARIPIPRAZOLE 10 MG PO TABS: 10.0000 mg | ORAL_TABLET | Freq: Every day | ORAL | 1 refills | Status: DC

## 2017-04-29 MED ORDER — ZOLPIDEM TARTRATE 10 MG PO TABS: 10.0000 mg | ORAL_TABLET | Freq: Every evening | ORAL | 0 refills | Status: DC | PRN

## 2017-04-29 MED ORDER — DIVALPROEX SODIUM 500 MG PO DR TAB: 500.0000 mg | DELAYED_RELEASE_TABLET | Freq: Two times a day (BID) | ORAL | 1 refills | Status: DC

## 2017-04-29 MED ORDER — FLUVOXAMINE MALEATE 100 MG PO TABS: 100.0000 mg | ORAL_TABLET | Freq: Every day | ORAL | 1 refills | Status: DC

## 2017-04-29 MED ORDER — HYDROXYZINE HCL 50 MG PO TABS: 50.0000 mg | ORAL_TABLET | Freq: Three times a day (TID) | ORAL | 1 refills | Status: DC | PRN

## 2017-04-29 MED ORDER — ARIPIPRAZOLE ER 400 MG IM SRER: 400.0000 mg | INTRAMUSCULAR | 1 refills | Status: DC

## 2017-04-29 NOTE — Progress Notes (Signed)
  [facility] Adult Case Management Discharge Plan :  Will you be returning to the same living situation after discharge:  Yes,  with family At discharge, do you have transportation home?: Yes,  family Do you have the ability to pay for your medications: Yes,  medicaid  Release of information consent forms completed and in the chart;  Patient's signature needed at discharge.  Patient to Follow up at: Follow-up Information    [facility] Follow up on 05/02/2017.   Why:  at 12:30pm for your hospital discharge appointment. Please bring hospital discharge paperwork. Please also attend your therapy appointment with Randall Hiss on Thursday, 05/09/17, at 10:30am. Contact information: [address] [facility] [address] [phone]           Next level of care provider has access to [place] and Suicide Prevention discussed: No.  Have you used any form of tobacco in the last 30 days? (Cigarettes, Smokeless Tobacco, Cigars, and/or Pipes): Yes  Has patient been referred to the Quitline?: Patient refused referral  Patient has been referred for addiction treatment: Yes  [name], LCSW 04/29/2017, 1:16 PM

## 2017-04-29 NOTE — Plan of Care (Signed)
Problem: Activity: Goal: Interest or engagement in activities will improve Outcome: Progressing Encouraged to attend groups Goal: Sleeping patterns will improve Outcome: Progressing Patients states he slept better.  Problem: Education: Goal: Emotional status will improve Outcome: Progressing Patient is smiling more Goal: Mental status will improve Outcome: Progressing He has brighter affect  Problem: Safety: Goal: Periods of time without injury will increase Outcome: Progressing No injuries this admission

## 2017-04-29 NOTE — Progress Notes (Signed)
Patient dc to home with girlfriend. States he understands all instructions

## 2017-04-29 NOTE — Progress Notes (Signed)
Patient up on the unit. He is social with select peers. He is med compliant. He denies si, hi,avh. He denies pain. He has a flat affect. He states he is ready to go home.

## 2017-04-29 NOTE — BHH Suicide Risk Assessment (Signed)
[facility] Discharge Suicide Risk Assessment   Principal Problem: Schizoaffective disorder, bipolar type [facility]) Discharge Diagnoses:  Patient Active Problem List   Diagnosis Date Noted  . Schizoaffective disorder, bipolar type ([place]) [F25.0] 04/24/2017  . MDD (major depressive disorder), recurrent episode, moderate ([place]) [F33.1] 12/26/2016  . Chronic back pain [M54.9, G89.29] 12/25/2016  . PTSD (post-traumatic stress disorder) [F43.10] 12/18/2016  . GERD (gastroesophageal reflux disease) [K21.9] 12/18/2016  . Crohn's disease ([place]) [K50.90] 12/12/2016  . Tobacco use disorder [F17.200] 04/05/2016    Total Time spent with patient: 30 minutes  Musculoskeletal: Strength & Muscle Tone: within normal limits Gait & Station: normal Patient leans: N/A  Psychiatric Specialty Exam: Review of Systems  Constitutional: Negative.   HENT: Negative.   Eyes: Negative.   Respiratory: Negative.   Cardiovascular: Negative.   Gastrointestinal: Negative.   Genitourinary: Negative.   Musculoskeletal: Negative.   Neurological: Negative.   Endo/Heme/Allergies: Negative.   Psychiatric/Behavioral: The patient has insomnia.     Blood pressure 137/87, pulse 95, temperature 97.8 F (36.6 C), resp. rate 18, height 6' 1"  (1.854 m), weight 135.6 kg (299 lb), SpO2 100 %.Body mass index is 39.45 kg/m.  General Appearance: Casual  Eye Contact::  Good  Speech:  Clear and Coherent409  Volume:  Normal  Mood:  Anxious  Affect:  Appropriate  Thought Process:  Goal Directed and Descriptions of Associations: Intact  Orientation:  Full (Time, Place, and Person)  Thought Content:  WDL  Suicidal Thoughts:  No  Homicidal Thoughts:  No  Memory:  Immediate;   Fair Recent;   Fair Remote;   Fair  Judgement:  Fair  Insight:  Fair  Psychomotor Activity:  Normal  Concentration:  Fair  Recall:  [employer] of [place]  Language: Fair  Akathisia:  No  Handed:  Right  AIMS (if indicated):     Assets:  Communication  Skills Desire for Improvement Financial Resources/Insurance Housing Intimacy Physical Health Resilience Social Support  Sleep:  Number of Hours: 7.15  Cognition: WNL  ADL's:  Intact   Mental Status Per Nursing Assessment::   On Admission:  NA  Demographic Factors:  Male, Caucasian and Unemployed  Loss Factors: Financial problems/change in socioeconomic status  Historical Factors: Prior suicide attempts, Family history of mental illness or substance abuse and Impulsivity  Risk Reduction Factors:   Responsible for children under 7 years of age, Sense of responsibility to family, Living with another person, especially a relative, Positive social support and Positive therapeutic relationship  Continued Clinical Symptoms:  Bipolar Disorder:   Depressive phase Depression:   Insomnia  Cognitive Features That Contribute To Risk:  None    Suicide Risk:  Minimal: No identifiable suicidal ideation.  Patients presenting with no risk factors but with morbid ruminations; may be classified as minimal risk based on the severity of the depressive symptoms  Follow-up Information    [facility] Follow up on 05/02/2017.   Why:  at 12:30pm for your hospital discharge appointment. Please bring hospital discharge paperwork. Please also attend your therapy appointment with Randall Hiss on Thursday, 05/09/17, at 10:30am. Contact information: [place] [address] [phone]           Plan Of Care/Follow-up recommendations:  Activity:  As tolerated. Diet:  Low sodium heart healthy. Other:  Keep follow-up appointments.  Orson Slick, MD 04/29/2017, 10:00 AM

## 2017-04-29 NOTE — BHH Group Notes (Signed)
[place] Group Notes:  (Nursing/MHT/Case Management/Adjunct)  Date:  04/29/2017  Time:  10:29 AM  Type of Therapy:  Psychoeducational Skills  Participation Level:  Active  Participation Quality:  Appropriate  Affect:  Appropriate  Cognitive:  Appropriate  Insight:  Appropriate  Engagement in Group:  Engaged  Modes of Intervention:  Discussion, Education and Support  Summary of Progress/Problems:  Carl Martinez 04/29/2017, 10:29 AM
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
## 2017-07-20 DIAGNOSIS — Z5321 Procedure and treatment not carried out due to patient leaving prior to being seen by health care provider: Secondary | ICD-10-CM | POA: Diagnosis not present

## 2017-07-20 DIAGNOSIS — R319 Hematuria, unspecified: Secondary | ICD-10-CM | POA: Insufficient documentation

## 2017-07-20 LAB — CBC
HCT: 47.8 % (ref 40.0–52.0)
HEMOGLOBIN: 16.5 g/dL (ref 13.0–18.0)
MCH: 31.8 pg (ref 26.0–34.0)
MCHC: 34.5 g/dL (ref 32.0–36.0)
MCV: 92.2 fL (ref 80.0–100.0)
Platelets: 187 10*3/uL (ref 150–440)
RBC: 5.19 MIL/uL (ref 4.40–5.90)
RDW: 13.8 % (ref 11.5–14.5)
WBC: 11 10*3/uL — ABNORMAL HIGH (ref 3.8–10.6)

## 2017-07-20 LAB — COMPREHENSIVE METABOLIC PANEL
ALBUMIN: 4 g/dL (ref 3.5–5.0)
ALK PHOS: 92 U/L (ref 38–126)
ALT: 78 U/L — AB (ref 17–63)
AST: 43 U/L — ABNORMAL HIGH (ref 15–41)
Anion gap: 8 (ref 5–15)
BUN: 11 mg/dL (ref 6–20)
CALCIUM: 9.2 mg/dL (ref 8.9–10.3)
CO2: 23 mmol/L (ref 22–32)
CREATININE: 1.05 mg/dL (ref 0.61–1.24)
Chloride: 107 mmol/L (ref 101–111)
GFR calc Af Amer: >60 mL/min (ref >60)
GFR calc non Af Amer: >60 mL/min (ref >60)
GLUCOSE: 117 mg/dL — AB (ref 65–99)
Potassium: 3.8 mmol/L (ref 3.5–5.1)
SODIUM: 138 mmol/L (ref 135–145)
Total Bilirubin: 0.8 mg/dL (ref 0.3–1.2)
Total Protein: 6.7 g/dL (ref 6.5–8.1)

## 2017-07-20 NOTE — ED Notes (Signed)
Pt says he is still unable to void; resting quietly in subwait recliner
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
## 2017-07-20 NOTE — ED Notes (Signed)
Pt arrived via EMS from home, brought to stat registration in wheelchair; pt c/o hematuria for 1 week and dysuria; history of kidney stones and kidney failure; pt has 20 gauge SL to Right AC; has been given 12m Toradol, 874mZofran and 5069mFentanyl; VS 98.0, 102, 126/palp, 96% room air;

## 2017-07-20 NOTE — ED Notes (Signed)
Pt noted to be resting in subwait recliner with eyes closed and even, unlabored respirations

## 2017-07-20 NOTE — ED Triage Notes (Signed)
Patient c/o hematuria X 1 week, and urinary retention beginning at 0600 today. Patient c/o bilateral flank pain. Patient reports hx of kidney stone. Patient bladder scanned in triage. 126 mL of urine present in the bladder.

## 2017-07-21 ENCOUNTER — Emergency Department
Admission: EM | Admit: 2017-07-21 | Discharge: 2017-07-21 | Discharge status: Against Medical Advice | Payer: Medicaid Other | Attending: Emergency Medicine | Admitting: Emergency Medicine

## 2017-07-21 HISTORY — DX: Disorder of kidney and ureter, unspecified: N28.9

## 2017-07-21 NOTE — ED Notes (Signed)
Pt requesting pain medication; spoke with MD; order for 1L NS as pt is still unable to void

## 2017-08-06 ENCOUNTER — Other Ambulatory Visit: Payer: Self-pay

## 2017-08-06 ENCOUNTER — Emergency Department
Admission: EM | Admit: 2017-08-06 | Discharge: 2017-08-07 | Disposition: A | Discharge status: Other Acute Inpatient Hospital | Payer: Medicaid Other | Attending: Emergency Medicine | Admitting: Emergency Medicine

## 2017-08-06 DIAGNOSIS — K219 Gastro-esophageal reflux disease without esophagitis: Secondary | ICD-10-CM | POA: Diagnosis present

## 2017-08-06 DIAGNOSIS — F4310 Post-traumatic stress disorder, unspecified: Secondary | ICD-10-CM | POA: Diagnosis present

## 2017-08-06 DIAGNOSIS — R440 Auditory hallucinations: Secondary | ICD-10-CM | POA: Diagnosis present

## 2017-08-06 DIAGNOSIS — Z9049 Acquired absence of other specified parts of digestive tract: Secondary | ICD-10-CM | POA: Insufficient documentation

## 2017-08-06 DIAGNOSIS — R443 Hallucinations, unspecified: Secondary | ICD-10-CM

## 2017-08-06 DIAGNOSIS — Z79899 Other long term (current) drug therapy: Secondary | ICD-10-CM | POA: Insufficient documentation

## 2017-08-06 DIAGNOSIS — R45851 Suicidal ideations: Secondary | ICD-10-CM

## 2017-08-06 DIAGNOSIS — F319 Bipolar disorder, unspecified: Secondary | ICD-10-CM | POA: Insufficient documentation

## 2017-08-06 DIAGNOSIS — F419 Anxiety disorder, unspecified: Secondary | ICD-10-CM | POA: Insufficient documentation

## 2017-08-06 DIAGNOSIS — F250 Schizoaffective disorder, bipolar type: Secondary | ICD-10-CM

## 2017-08-06 DIAGNOSIS — F17210 Nicotine dependence, cigarettes, uncomplicated: Secondary | ICD-10-CM | POA: Insufficient documentation

## 2017-08-06 LAB — COMPREHENSIVE METABOLIC PANEL
ALBUMIN: 4.1 g/dL (ref 3.5–5.0)
ALT: 75 U/L — ABNORMAL HIGH (ref 17–63)
AST: 41 U/L (ref 15–41)
Alkaline Phosphatase: 90 U/L (ref 38–126)
Anion gap: 10 (ref 5–15)
BUN: 12 mg/dL (ref 6–20)
CHLORIDE: 103 mmol/L (ref 101–111)
CO2: 26 mmol/L (ref 22–32)
Calcium: 9.4 mg/dL (ref 8.9–10.3)
Creatinine, Ser: 1.08 mg/dL (ref 0.61–1.24)
GFR calc Af Amer: >60 mL/min (ref >60)
GFR calc non Af Amer: >60 mL/min (ref >60)
GLUCOSE: 116 mg/dL — AB (ref 65–99)
POTASSIUM: 3.7 mmol/L (ref 3.5–5.1)
Sodium: 139 mmol/L (ref 135–145)
Total Bilirubin: <0.1 mg/dL — ABNORMAL LOW (ref 0.3–1.2)
Total Protein: 7.3 g/dL (ref 6.5–8.1)

## 2017-08-06 LAB — ACETAMINOPHEN LEVEL

## 2017-08-06 LAB — CBC
HEMATOCRIT: 48.1 % (ref 40.0–52.0)
Hemoglobin: 16.6 g/dL (ref 13.0–18.0)
MCH: 31.8 pg (ref 26.0–34.0)
MCHC: 34.6 g/dL (ref 32.0–36.0)
MCV: 92 fL (ref 80.0–100.0)
Platelets: 205 10*3/uL (ref 150–440)
RBC: 5.23 MIL/uL (ref 4.40–5.90)
RDW: 13.7 % (ref 11.5–14.5)
WBC: 11.8 10*3/uL — AB (ref 3.8–10.6)

## 2017-08-06 LAB — URINE DRUG SCREEN, QUALITATIVE (ARMC ONLY)
AMPHETAMINES, UR SCREEN: NOT DETECTED
Barbiturates, Ur Screen: NOT DETECTED
Benzodiazepine, Ur Scrn: POSITIVE — AB
Cannabinoid 50 Ng, Ur ~~LOC~~: NOT DETECTED
Cocaine Metabolite,Ur ~~LOC~~: NOT DETECTED
MDMA (ECSTASY) UR SCREEN: NOT DETECTED
METHADONE SCREEN, URINE: NOT DETECTED
OPIATE, UR SCREEN: NOT DETECTED
PHENCYCLIDINE (PCP) UR S: NOT DETECTED
Tricyclic, Ur Screen: NOT DETECTED

## 2017-08-06 LAB — ETHANOL: Alcohol, Ethyl (B): <10 mg/dL (ref <10)

## 2017-08-06 LAB — SALICYLATE LEVEL: Salicylate Lvl: <7 mg/dL (ref 2.8–30.0)

## 2017-08-06 LAB — VALPROIC ACID LEVEL: Valproic Acid Lvl: <10 ug/mL — ABNORMAL LOW (ref 50.0–100.0)

## 2017-08-06 MED ORDER — HYDROXYZINE HCL 25 MG PO TABS: 50.0000 mg | ORAL_TABLET | Freq: Three times a day (TID) | ORAL | Status: DC | PRN

## 2017-08-06 MED ORDER — FLUVOXAMINE MALEATE 50 MG PO TABS
100.0000 mg | ORAL_TABLET | Freq: Every day | ORAL | Status: DC
  Administered 2017-08-06: 100 mg via ORAL
  Filled 2017-08-06: qty 1

## 2017-08-06 MED ORDER — GABAPENTIN 600 MG PO TABS
ORAL_TABLET | ORAL | Status: AC
  Filled 2017-08-06: qty 1

## 2017-08-06 MED ORDER — GABAPENTIN 300 MG PO CAPS
600.0000 mg | ORAL_CAPSULE | Freq: Three times a day (TID) | ORAL | Status: DC
  Administered 2017-08-06: 600 mg via ORAL
  Filled 2017-08-06: qty 2

## 2017-08-06 MED ORDER — LORAZEPAM 1 MG PO TABS
1.0000 mg | ORAL_TABLET | Freq: Once | ORAL | Status: AC
  Administered 2017-08-06: 1 mg via ORAL
  Filled 2017-08-06: qty 1

## 2017-08-06 MED ORDER — MAGNESIUM OXIDE 400 (241.3 MG) MG PO TABS
400.0000 mg | ORAL_TABLET | Freq: Every day | ORAL | Status: DC
  Administered 2017-08-06: 400 mg via ORAL
  Filled 2017-08-06: qty 1

## 2017-08-06 MED ORDER — ARIPIPRAZOLE 10 MG PO TABS
10.0000 mg | ORAL_TABLET | Freq: Every day | ORAL | Status: DC
  Administered 2017-08-06: 10 mg via ORAL
  Filled 2017-08-06: qty 1

## 2017-08-06 MED ORDER — HALOPERIDOL 2 MG PO TABS: 2.0000 mg | ORAL_TABLET | Freq: Four times a day (QID) | ORAL | Status: DC | PRN

## 2017-08-06 MED ORDER — ZOLPIDEM TARTRATE 10 MG PO TABS: 10.0000 mg | ORAL_TABLET | Freq: Every evening | ORAL | Status: DC | PRN

## 2017-08-06 MED ORDER — DIVALPROEX SODIUM 500 MG PO DR TAB
500.0000 mg | DELAYED_RELEASE_TABLET | Freq: Two times a day (BID) | ORAL | Status: DC
  Administered 2017-08-06: 500 mg via ORAL
  Filled 2017-08-06: qty 1

## 2017-08-06 NOTE — ED Notes (Signed)
Pt dressed out into appropriate behavioral health clothing with this tech and [employer] in the rm. Pt belongings consist of black shoes,black socks, black pants and a yellow shirt. Pt walked back to [facility]. Pt does not feel urge to urinate at this moment, given specimen cup for when does need to urinate.
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
## 2017-08-06 NOTE — ED Notes (Signed)
Pt. Advised of cameras in unit and 15 minute safety checks.  Pt. States he has been here in the past.

## 2017-08-06 NOTE — ED Notes (Signed)
Pt will go to BHU instead of downstairs, 2 pts in front of pt to go downstairs. Report given to [place], [place]

## 2017-08-06 NOTE — ED Notes (Signed)
Pt reports has not been taking meds due to he feels like they are not working. Will discuss with MD about ordering home meds.

## 2017-08-06 NOTE — ED Notes (Signed)
When asked pt if he was having active suicidal thoughts pt responds, "yes" When asked his plan, pt reports he wants to hang himself. Sitter in room with patient.

## 2017-08-06 NOTE — ED Triage Notes (Signed)
Pt states he has been homicidal and suicidal X 2 weeks, worsening today. Pt into ER voluntarily. Pt alert and oriented X4, active, cooperative, pt in NAD. RR even and unlabored, color WNL.

## 2017-08-06 NOTE — ED Notes (Signed)
Pt resting comfortably, respirations even and unlabored. Sitter in room with pt.

## 2017-08-06 NOTE — ED Provider Notes (Signed)
[facility] Emergency Department Provider Note   ____________________________________________   First MD Initiated Contact with Patient 08/06/17 1833     (approximate)  I have reviewed the triage vital signs and the nursing notes.   HISTORY  Chief Complaint Homicidal and Suicidal   HPI Carl Martinez is a 27 y.o. male Who reports he is hearing voices and his demons are coming back. Her telling him to kill himself and other people. When asked by the nurse he said he would hang himself. Hang himself from a tree. He reports feeling the symptoms when going on for about 2 weeks this time but they're much worse today. He also says he's very anxious. He asked for something for that. He seemed to be satisfied I said had given him some Ativan.  Past Medical History:  Diagnosis Date  . Anxiety   . Bipolar 1 disorder ([place])   . Crohn disease ([place])   . Depression   . Renal disorder     Patient Active Problem List   Diagnosis Date Noted  . Schizoaffective disorder, bipolar type ([place]) 04/24/2017  . MDD (major depressive disorder), recurrent episode, moderate ([place]) 12/26/2016  . Chronic back pain 12/25/2016  . PTSD (post-traumatic stress disorder) 12/18/2016  . GERD (gastroesophageal reflux disease) 12/18/2016  . Crohn's disease ([place]) 12/12/2016  . Tobacco use disorder 04/05/2016    Past Surgical History:  Procedure Laterality Date  . CHOLECYSTECTOMY      Prior to Admission medications   Medication Sig Start Date End Date Taking? Authorizing Provider  ARIPiprazole (ABILIFY) 10 MG tablet Take 1 tablet (10 mg total) by mouth daily. 04/30/17   Pucilowska, Jolanta B, MD  ARIPiprazole ER 400 MG SRER Inject 400 mg into the muscle every 28 (twenty-eight) days. 05/25/17   Pucilowska, Herma Ard B, MD  cyclobenzaprine (FLEXERIL) 10 MG tablet Take 1 tablet (10 mg total) by mouth 3 (three) times daily. 12/18/16   Pucilowska, Herma Ard B, MD  divalproex (DEPAKOTE) 500  MG DR tablet Take 1 tablet (500 mg total) by mouth every 12 (twelve) hours. 04/29/17   Pucilowska, Jolanta B, MD  fluvoxaMINE (LUVOX) 100 MG tablet Take 1 tablet (100 mg total) by mouth at bedtime. 04/29/17   Pucilowska, Jolanta B, MD  gabapentin (NEURONTIN) 300 MG capsule Take 2 capsules (600 mg total) by mouth 3 (three) times daily. 04/29/17   Pucilowska, Herma Ard B, MD  hydrOXYzine (ATARAX/VISTARIL) 50 MG tablet Take 1 tablet (50 mg total) by mouth 3 (three) times daily as needed for anxiety. 04/29/17   Pucilowska, Jolanta B, MD  magnesium oxide (MAG-OX) 400 MG tablet Take 400 mg by mouth daily.    [provider]  omeprazole (PRILOSEC) 20 MG capsule Take 1 capsule (20 mg total) by mouth daily. 12/18/16   Pucilowska, Herma Ard B, MD  traZODone (DESYREL) 100 MG tablet Take 2 tablets (200 mg total) by mouth at bedtime. 04/29/17   Pucilowska, Jolanta B, MD  zolpidem (AMBIEN) 10 MG tablet Take 1 tablet (10 mg total) by mouth at bedtime as needed for sleep. 04/29/17   Pucilowska, Wardell Honour, MD    Allergies Nsaids  No family history on file.  Social History Social History   Tobacco Use  . Smoking status: Current Every Day Smoker    Packs/day: 1.50    Years: 10.00    Pack years: 15.00    Types: Cigarettes  . Smokeless tobacco: Never Used  Substance Use Topics  . Alcohol use: No  . Drug  use: No    Review of Systems  Constitutional: No fever/chills Eyes: No visual changes. ENT: No sore throat. Cardiovascular: Denies chest pain. Respiratory: Denies shortness of breath. Gastrointestinal: No abdominal pain.  No nausea, no vomiting.  No diarrhea.  No constipation. Genitourinary: Negative for dysuria. Musculoskeletal: Negative for back pain. Skin: Negative for rash. Neurological: Negative for headaches, focal weakness  ____________________________________________   PHYSICAL EXAM:  VITAL SIGNS: ED Triage Vitals  Enc Vitals Group     BP 08/06/17 1730 (!) 142/89     Pulse Rate  08/06/17 1730 96     Resp 08/06/17 1730 20     Temp 08/06/17 1730 98.2 F (36.8 C)     Temp Source 08/06/17 1730 Oral     SpO2 08/06/17 1730 97 %     Weight 08/06/17 1730 (!) 302 lb (137 kg)     Height 08/06/17 1730 6' 1"  (1.854 m)     Head Circumference --      Peak Flow --      Pain Score 08/06/17 1729 0     Pain Loc --      Pain Edu? --      Excl. in [place]? --     Constitutional: Alert and oriented. Well appearing and in no acute distress. Eyes: Conjunctivae are normal.  Head: Atraumatic. Nose: No congestion/rhinnorhea. Mouth/Throat: Mucous membranes are moist.  Oropharynx non-erythematous. Neck: No stridor.   Cardiovascular: Normal rate, regular rhythm. Grossly normal heart sounds.  Good peripheral circulation. Respiratory: Normal respiratory effort.  No retractions. Lungs CTAB. Gastrointestinal: Soft and nontender. No distention. No abdominal bruits. No CVA tenderness. Musculoskeletal: No lower extremity tenderness nor edema.  No joint effusions. Neurologic:  Normal speech and language. No gross focal neurologic deficits are appreciated. No gait instability. Skin:  Skin is warm, dry and intact. No rash noted.   ____________________________________________   LABS (all labs ordered are listed, but only abnormal results are displayed)  Labs Reviewed  COMPREHENSIVE METABOLIC PANEL - Abnormal; Notable for the following components:      Result Value   Glucose, Bld 116 (*)    ALT 75 (*)    Total Bilirubin <0.1 (*)    All other components within normal limits  ACETAMINOPHEN LEVEL - Abnormal; Notable for the following components:   Acetaminophen (Tylenol), Serum <10 (*)    All other components within normal limits  CBC - Abnormal; Notable for the following components:   WBC 11.8 (*)    All other components within normal limits  ETHANOL  SALICYLATE LEVEL  URINE DRUG SCREEN, QUALITATIVE ([facility] ONLY)    ____________________________________________  EKG   ____________________________________________  RADIOLOGY   ____________________________________________   PROCEDURES  Procedure(s) performed:   Procedures  Critical Care performed:   ____________________________________________   INITIAL IMPRESSION / ASSESSMENT AND PLAN / ED COURSE  told psychiatry consult ordered      ____________________________________________   FINAL CLINICAL IMPRESSION(S) / ED DIAGNOSES  Final diagnoses:  Suicidal thoughts  Hallucinations     ED Discharge Orders    None       Note:  This document was prepared using Dragon voice recognition software and may include unintentional dictation errors.    Nena Polio, MD 08/06/17 [phone]

## 2017-08-06 NOTE — ED Notes (Signed)
Pt given blanket and peanut butter and crackers. Pt calm and cooperative. Sitter at bedside.

## 2017-08-06 NOTE — ED Notes (Addendum)
First Nurse Note:  Pt to ed with c/o SI.  Pt states multiple recent admissions for same.  Reports approx 9 admissions over the last year for same.  Family with pt.  Pt reports plan to hang himself from a tree.

## 2017-08-06 NOTE — ED Notes (Signed)
Pt. Moved into BHU #3 from quad.  Pt. Calm and cooperative at this time.  Pt. Requested and was given new scrub bottoms.  Pt. Also requested meal tray and drink, pt. Given meal tray and drink.

## 2017-08-07 ENCOUNTER — Inpatient Hospital Stay
Admission: AD | Admit: 2017-08-07 | Discharge: 2017-08-13 | DRG: 885 | Disposition: A | Discharge status: Home / Self Care | Payer: Medicaid Other | Source: Intra-hospital | Attending: Psychiatry | Admitting: Psychiatry

## 2017-08-07 ENCOUNTER — Other Ambulatory Visit: Payer: Self-pay

## 2017-08-07 DIAGNOSIS — Z818 Family history of other mental and behavioral disorders: Secondary | ICD-10-CM

## 2017-08-07 DIAGNOSIS — F17200 Nicotine dependence, unspecified, uncomplicated: Secondary | ICD-10-CM | POA: Diagnosis present

## 2017-08-07 DIAGNOSIS — F4310 Post-traumatic stress disorder, unspecified: Secondary | ICD-10-CM | POA: Diagnosis present

## 2017-08-07 DIAGNOSIS — K5090 Crohn's disease, unspecified, without complications: Secondary | ICD-10-CM | POA: Diagnosis present

## 2017-08-07 DIAGNOSIS — F250 Schizoaffective disorder, bipolar type: Secondary | ICD-10-CM | POA: Diagnosis not present

## 2017-08-07 DIAGNOSIS — K219 Gastro-esophageal reflux disease without esophagitis: Secondary | ICD-10-CM | POA: Diagnosis present

## 2017-08-07 DIAGNOSIS — Z9049 Acquired absence of other specified parts of digestive tract: Secondary | ICD-10-CM | POA: Diagnosis not present

## 2017-08-07 DIAGNOSIS — K0889 Other specified disorders of teeth and supporting structures: Secondary | ICD-10-CM | POA: Diagnosis present

## 2017-08-07 DIAGNOSIS — Z9119 Patient's noncompliance with other medical treatment and regimen: Secondary | ICD-10-CM

## 2017-08-07 DIAGNOSIS — Z765 Malingerer [conscious simulation]: Secondary | ICD-10-CM

## 2017-08-07 DIAGNOSIS — G8929 Other chronic pain: Secondary | ICD-10-CM | POA: Diagnosis present

## 2017-08-07 DIAGNOSIS — Z91199 Patient's noncompliance with other medical treatment and regimen due to unspecified reason: Secondary | ICD-10-CM

## 2017-08-07 DIAGNOSIS — R45851 Suicidal ideations: Secondary | ICD-10-CM | POA: Diagnosis present

## 2017-08-07 DIAGNOSIS — Z886 Allergy status to analgesic agent status: Secondary | ICD-10-CM | POA: Diagnosis not present

## 2017-08-07 DIAGNOSIS — G4700 Insomnia, unspecified: Secondary | ICD-10-CM | POA: Diagnosis present

## 2017-08-07 DIAGNOSIS — F17210 Nicotine dependence, cigarettes, uncomplicated: Secondary | ICD-10-CM | POA: Diagnosis present

## 2017-08-07 DIAGNOSIS — Z79899 Other long term (current) drug therapy: Secondary | ICD-10-CM | POA: Diagnosis not present

## 2017-08-07 MED ORDER — DIVALPROEX SODIUM 500 MG PO DR TAB: 500.0000 mg | DELAYED_RELEASE_TABLET | Freq: Two times a day (BID) | ORAL | Status: DC

## 2017-08-07 MED ORDER — PANTOPRAZOLE SODIUM 40 MG PO TBEC
40.0000 mg | DELAYED_RELEASE_TABLET | Freq: Every day | ORAL | Status: DC
  Administered 2017-08-07 – 2017-08-13 (×7): 40 mg via ORAL
  Filled 2017-08-07 (×7): qty 1

## 2017-08-07 MED ORDER — DIVALPROEX SODIUM 500 MG PO DR TAB: 500.0000 mg | DELAYED_RELEASE_TABLET | Freq: Three times a day (TID) | ORAL | Status: DC

## 2017-08-07 MED ORDER — ESCITALOPRAM OXALATE 10 MG PO TABS
10.0000 mg | ORAL_TABLET | Freq: Every day | ORAL | Status: DC
  Administered 2017-08-07 – 2017-08-13 (×7): 10 mg via ORAL
  Filled 2017-08-07 (×7): qty 1

## 2017-08-07 MED ORDER — ACETAMINOPHEN 325 MG PO TABS
650.0000 mg | ORAL_TABLET | Freq: Four times a day (QID) | ORAL | Status: DC | PRN
  Administered 2017-08-07 – 2017-08-12 (×11): 650 mg via ORAL
  Filled 2017-08-07 (×11): qty 2

## 2017-08-07 MED ORDER — ARIPIPRAZOLE ER 400 MG IM SRER: 400.0000 mg | Freq: Once | INTRAMUSCULAR | Status: DC

## 2017-08-07 MED ORDER — ZOLPIDEM TARTRATE 5 MG PO TABS
10.0000 mg | ORAL_TABLET | Freq: Every day | ORAL | Status: DC
  Administered 2017-08-07 – 2017-08-12 (×6): 10 mg via ORAL
  Filled 2017-08-07 (×6): qty 2

## 2017-08-07 MED ORDER — ALUM & MAG HYDROXIDE-SIMETH 200-200-20 MG/5ML PO SUSP: 30.0000 mL | ORAL | Status: DC | PRN

## 2017-08-07 MED ORDER — TRAZODONE HCL 100 MG PO TABS
200.0000 mg | ORAL_TABLET | Freq: Every day | ORAL | Status: DC
  Administered 2017-08-07 – 2017-08-12 (×6): 200 mg via ORAL
  Filled 2017-08-07 (×6): qty 2

## 2017-08-07 MED ORDER — HYDROXYZINE HCL 50 MG PO TABS
50.0000 mg | ORAL_TABLET | Freq: Four times a day (QID) | ORAL | Status: DC | PRN
  Administered 2017-08-07: 50 mg via ORAL
  Filled 2017-08-07: qty 1

## 2017-08-07 MED ORDER — GABAPENTIN 600 MG PO TABS
1200.0000 mg | ORAL_TABLET | Freq: Three times a day (TID) | ORAL | Status: DC
  Administered 2017-08-07 – 2017-08-13 (×18): 1200 mg via ORAL
  Filled 2017-08-07 (×18): qty 2

## 2017-08-07 MED ORDER — TRAZODONE HCL 100 MG PO TABS: 100.0000 mg | ORAL_TABLET | Freq: Every day | ORAL | Status: DC

## 2017-08-07 MED ORDER — ARIPIPRAZOLE 10 MG PO TABS
10.0000 mg | ORAL_TABLET | Freq: Every day | ORAL | Status: DC
  Administered 2017-08-07: 10 mg via ORAL
  Filled 2017-08-07: qty 1

## 2017-08-07 MED ORDER — NICOTINE 21 MG/24HR TD PT24
21.0000 mg | MEDICATED_PATCH | Freq: Every day | TRANSDERMAL | Status: DC
  Administered 2017-08-07 – 2017-08-12 (×6): 21 mg via TRANSDERMAL
  Filled 2017-08-07 (×7): qty 1

## 2017-08-07 MED ORDER — MAGNESIUM HYDROXIDE 400 MG/5ML PO SUSP: 30.0000 mL | Freq: Every day | ORAL | Status: DC | PRN

## 2017-08-07 MED ORDER — DIVALPROEX SODIUM 500 MG PO DR TAB
500.0000 mg | DELAYED_RELEASE_TABLET | Freq: Two times a day (BID) | ORAL | Status: DC
  Administered 2017-08-07 – 2017-08-13 (×13): 500 mg via ORAL
  Filled 2017-08-07 (×13): qty 1
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
## 2017-08-07 NOTE — BH Assessment (Signed)
Patient is to be admitted to [facility] by Dr. Weber Cooks.  Attending Physician will be Dr. Wonda Olds.   Patient has been assigned to room 302, by [place].   Intake Paper Work has been signed and placed on patient chart.  ER staff is aware of the admission [facility] ER Sect.; Dr. Karma Greaser, ER MD; Catalina [place]ig[place] Patient's Nurse & [facility] Patient Access).

## 2017-08-07 NOTE — Progress Notes (Signed)
CSW contacted [place] to inquire about pt's last Abilify shot. CSW was informed that pt's last shot was on 07/26/17 at 457m. CSW will pass this information along to MD.   KAlden Hipp MSW, LCSW 08/07/2017 3:30 PM

## 2017-08-07 NOTE — BHH Group Notes (Signed)
[place] Group Notes:  (Nursing/MHT/Case Management/Adjunct)  Date:  08/07/2017  Time:  11:30 PM  Type of Therapy:  Group Therapy  Participation Level:  Active  Participation Quality:  Appropriate  Affect:  Appropriate  Cognitive:  Alert  Insight:  Good  Engagement in Group:  Engaged  Modes of Intervention:  Support  Summary of Progress/Problems:  Carl Martinez 08/07/2017, 11:30 PM

## 2017-08-07 NOTE — Progress Notes (Deleted)
Recreation Therapy Notes   Date: 01.09.2019  Time: 1:00PM  Location: Craft Room  Behavioral response: Appropriate  Intervention Topic: Creative Expressions  Discussion/Intervention: Group content on today was focused on creative expressions. The group defined creative expressions and ways they use creative expressions. Individual identified other positive ways creative expressions can be used and why it is important to express yourself. Patients participated in the intervention "learning origami", where they had a chance to learn new ways to creatively express themselves. Clinical Observations/Feedback:  Patient came to group and stated creative expression can involve talking and being with friends. He stated that if you do not express yourself others will take you for granted. Individual left group due to unknown reasons and never returned.  Markese Bloxham LRT/CTRS         Valora Norell 08/07/2017 2:47 PM

## 2017-08-07 NOTE — Progress Notes (Addendum)
Patient found in day room upon my arrival. Patient is visible and somewhat social throughout the evening. Continues to endorse passive SI and depression. Contracts for safety while on the unit. Reports ongoing migraine headache. Reports taking Tylenol 1056m @home . Was given Tylenol 6515mPO @1734 . Educated as to medication's frequency. Emotional support provided. Reports anxiety, given Vistaril with positive results. Compliant with HS medications and staff direction. Patient is calm and cooperative with assessment. Denies HI and AVH. Reports eating and voiding adequately. Q 15 minute checks maintained. Will continue to monitor patient throughout the shift. Patient slept 7.25 hours. Nicotine patch removed and replaced. Reports sleeping adequately. Will endorse care to oncoming shift.

## 2017-08-07 NOTE — Progress Notes (Signed)
Patient is received from [facility] alert and oriented, patient is known to [place] from his previous encounter with this unit and is familiar with our care staff and safety procedures, patient endorses auditory hallucinations no delusions or bizarre behaviors , thought content is logical and appropriate, patient is stable and responding well and participating in assessment processes, patient is calm and denies any suicidal ideations at this time and stated that "I 'm sleepy". No distress noted.

## 2017-08-07 NOTE — BHH Group Notes (Signed)
08/07/2017 1PM  Type of Therapy/Topic:  Group Therapy:  Emotion Regulation  Participation Level:  Did Not Attend   Description of Group:   The purpose of this group is to assist patients in learning to regulate negative emotions and experience positive emotions. Patients will be guided to discuss ways in which they have been vulnerable to their negative emotions. These vulnerabilities will be juxtaposed with experiences of positive emotions or situations, and patients will be challenged to use positive emotions to combat negative ones. Special emphasis will be placed on coping with negative emotions in conflict situations, and patients will process healthy conflict resolution skills.  Therapeutic Goals: 1. Patient will identify two positive emotions or experiences to reflect on in order to balance out negative emotions 2. Patient will label two or more emotions that they find the most difficult to experience 3. Patient will demonstrate positive conflict resolution skills through discussion and/or role plays  Summary of Patient Progress: Patient was encouraged and invited to attend group. Patient did not attend group. Social worker will continue to encourage group participation in the future.       Therapeutic Modalities:   Cognitive Behavioral Therapy Feelings Identification Dialectical Behavioral Therapy   Darin Engels, LCSW 08/07/2017 1:45 PM

## 2017-08-07 NOTE — Progress Notes (Signed)
Assessment is complete and skin and body search done by two RNs present  Ale/ Abi. And no contraband found on patient., patient contract for safety of self and others.

## 2017-08-07 NOTE — Tx Team (Signed)
Interdisciplinary Treatment and Diagnostic Plan Update  08/07/2017 Time of Session: [place] MRN: [id number]  Principal Diagnosis: Schizoaffective disorder, bipolar type ([place])  Secondary Diagnoses: Principal Problem:   Schizoaffective disorder, bipolar type ([place]) Active Problems:   Tobacco use disorder   PTSD (post-traumatic stress disorder)   GERD (gastroesophageal reflux disease)   Non-compliance with treatment   Current Medications:  Current Facility-Administered Medications  Medication Dose Route Frequency Provider Last Rate Last Dose  . acetaminophen (TYLENOL) tablet 650 mg  650 mg Oral Q6H PRN Pucilowska, Jolanta B, MD      . alum & mag hydroxide-simeth (MAALOX/MYLANTA) 200-200-20 MG/5ML suspension 30 mL  30 mL Oral Q4H PRN Pucilowska, Jolanta B, MD      . ARIPiprazole (ABILIFY) tablet 10 mg  10 mg Oral Daily Pucilowska, Jolanta B, MD   10 mg at 08/07/17 0915  . divalproex (DEPAKOTE) DR tablet 500 mg  500 mg Oral Q12H McNew, Tyson Babinski, MD      . escitalopram (LEXAPRO) tablet 10 mg  10 mg Oral Daily McNew, Holly R, MD      . gabapentin (NEURONTIN) tablet 1,200 mg  1,200 mg Oral TID McNew, Tyson Babinski, MD      . magnesium hydroxide (MILK OF MAGNESIA) suspension 30 mL  30 mL Oral Daily PRN Pucilowska, Jolanta B, MD      . nicotine (NICODERM CQ - dosed in mg/24 hours) patch 21 mg  21 mg Transdermal Q0600 Pucilowska, Jolanta B, MD   21 mg at 08/07/17 0927  . pantoprazole (PROTONIX) EC tablet 40 mg  40 mg Oral Daily McNew, Holly R, MD      . traZODone (DESYREL) tablet 200 mg  200 mg Oral QHS McNew, Holly R, MD      . zolpidem (AMBIEN) tablet 10 mg  10 mg Oral QHS McNew, Tyson Babinski, MD       PTA Medications: Medications Prior to Admission  Medication Sig Dispense Refill Last Dose  . ARIPiprazole (ABILIFY) 10 MG tablet Take 1 tablet (10 mg total) by mouth daily. 30 tablet 1   . ARIPiprazole ER 400 MG SRER Inject 400 mg into the muscle every 28 (twenty-eight) days. 1 each 1    . cyclobenzaprine (FLEXERIL) 10 MG tablet Take 1 tablet (10 mg total) by mouth 3 (three) times daily. 90 tablet 0 04/23/2017 at Unknown time  . divalproex (DEPAKOTE) 500 MG DR tablet Take 1 tablet (500 mg total) by mouth every 12 (twelve) hours. 60 tablet 1   . fluvoxaMINE (LUVOX) 100 MG tablet Take 1 tablet (100 mg total) by mouth at bedtime. 30 tablet 1   . gabapentin (NEURONTIN) 300 MG capsule Take 2 capsules (600 mg total) by mouth 3 (three) times daily. 180 capsule 1   . hydrOXYzine (ATARAX/VISTARIL) 50 MG tablet Take 1 tablet (50 mg total) by mouth 3 (three) times daily as needed for anxiety. 90 tablet 1   . magnesium oxide (MAG-OX) 400 MG tablet Take 400 mg by mouth daily.   04/24/2017 at Unknown time  . omeprazole (PRILOSEC) 20 MG capsule Take 1 capsule (20 mg total) by mouth daily. 30 capsule 0 04/24/2017 at Unknown time  . traZODone (DESYREL) 100 MG tablet Take 2 tablets (200 mg total) by mouth at bedtime. 60 tablet 1   . zolpidem (AMBIEN) 10 MG tablet Take 1 tablet (10 mg total) by mouth at bedtime as needed for sleep. 30 tablet 0     Patient Stressors: Financial difficulties Health problems  Medication change or noncompliance Occupational concerns  Patient Strengths: Average or above average intelligence Capable of independent living Communication skills Motivation for treatment/growth Physical Health  Treatment Modalities: Medication Management, Group therapy, Case management,  1 to 1 session with clinician, Psychoeducation, Recreational therapy.   Physician Treatment Plan for Primary Diagnosis: Schizoaffective disorder, bipolar type ([place]) Long Term Goal(s):     Short Term Goals:    Medication Management: Evaluate patient's response, side effects, and tolerance of medication regimen.  Therapeutic Interventions: 1 to 1 sessions, Unit Group sessions and Medication administration.  Evaluation of Outcomes: Progressing  Physician Treatment Plan for Secondary Diagnosis:  Principal Problem:   Schizoaffective disorder, bipolar type ([place]) Active Problems:   Tobacco use disorder   PTSD (post-traumatic stress disorder)   GERD (gastroesophageal reflux disease)   Non-compliance with treatment  Long Term Goal(s):     Short Term Goals:       Medication Management: Evaluate patient's response, side effects, and tolerance of medication regimen.  Therapeutic Interventions: 1 to 1 sessions, Unit Group sessions and Medication administration.  Evaluation of Outcomes: Progressing   RN Treatment Plan for Primary Diagnosis: Schizoaffective disorder, bipolar type ([place]) Long Term Goal(s): Knowledge of disease and therapeutic regimen to maintain health will improve  Short Term Goals: Ability to participate in decision making will improve, Ability to identify and develop effective coping behaviors will improve and Compliance with prescribed medications will improve  Medication Management: RN will administer medications as ordered by provider, will assess and evaluate patient's response and provide education to patient for prescribed medication. RN will report any adverse and/or side effects to prescribing provider.  Therapeutic Interventions: 1 on 1 counseling sessions, Psychoeducation, Medication administration, Evaluate responses to treatment, Monitor vital signs and CBGs as ordered, Perform/monitor CIWA, COWS, AIMS and Fall Risk screenings as ordered, Perform wound care treatments as ordered.  Evaluation of Outcomes: Progressing   LCSW Treatment Plan for Primary Diagnosis: Schizoaffective disorder, bipolar type ([place]) Long Term Goal(s): Safe transition to appropriate next level of care at discharge, Engage patient in therapeutic group addressing interpersonal concerns.  Short Term Goals: Engage patient in aftercare planning with referrals and resources, Identify triggers associated with mental health/substance abuse issues and Increase skills for wellness and  recovery  Therapeutic Interventions: Assess for all discharge needs, 1 to 1 time with Social worker, Explore available resources and support systems, Assess for adequacy in community support network, Educate family and significant other(s) on suicide prevention, Complete Psychosocial Assessment, Interpersonal group therapy.  Evaluation of Outcomes: Progressing   Progress in Treatment: Attending groups: No. Participating in groups: No. Taking medication as prescribed: Yes. Toleration medication: Yes. Family/Significant other contact made: Yes, individual(s) contacted:  pt's aunt. Patient understands diagnosis: Yes. Discussing patient identified problems/goals with staff: Yes. Medical problems stabilized or resolved: Yes. Denies suicidal/homicidal ideation: Yes. Issues/concerns per patient self-inventory: No. Other: None at this time.   New problem(s) identified: No, Describe:  none at this time.   New Short Term/Long Term Goal(s): Pt reported his goal for treatment is to, "not come back to the hospital and try to find something that works. I want to get better."   Discharge Plan or Barriers: CSW will continue to assess for appropriate discharge plan. Currently the plan is for pt to discharge home and continue treatment in the outpatient setting with [facility].   Reason for Continuation of Hospitalization: Delusions  Hallucinations Medication stabilization  Estimated Length of Stay: 3-5 days  Recreational Therapy: Patient Stressors: My anger and anxiety  Patient Goal: Patient will engage in interactions with peers and staff in a pro-social manner x5 days.   Attendees: Patient: Carl Martinez 08/07/2017 11:16 AM  Physician: Dr. Wonda Olds, MD 08/07/2017 11:16 AM  Nursing: Tami Lin, RN 08/07/2017 11:16 AM  RN Care Manager:  08/07/2017 11:16 AM  Social Worker: Alden Hipp, LCSW 08/07/2017 11:16 AM  Recreational Therapist: Roanna Epley, CTRS-LTR 08/07/2017 11:16 AM  Other: Darin Engels,  [place] 08/07/2017 11:16 AM  Other:  08/07/2017 11:16 AM  Other: 08/07/2017 11:16 AM    Scribe for Treatment Team: Alden Hipp, LCSW 08/07/2017 11:16 AM

## 2017-08-07 NOTE — Plan of Care (Signed)
  Progressing Education: Ability to make informed decisions regarding treatment will improve 08/07/2017 2337 - Progressing by Derek Mound, RN Self-Concept: Ability to disclose and discuss suicidal ideas will improve 08/07/2017 2337 - Progressing by Derek Mound, RN Ability to verbalize positive feelings about self will improve 08/07/2017 2337 - Progressing by Derek Mound, RN Education: Utilization of techniques to improve thought processes will improve 08/07/2017 2337 - Progressing by Derek Mound, RN Knowledge of the prescribed therapeutic regimen will improve 08/07/2017 2337 - Progressing by Derek Mound, RN Coping: Ability to verbalize feelings will improve 08/07/2017 2337 - Progressing by Derek Mound, RN Health Behavior/Discharge Planning: Compliance with therapeutic regimen will improve 08/07/2017 2337 - Progressing by Derek Mound, RN Health Behavior/Discharge Planning: Ability to manage health-related needs will improve 08/07/2017 2337 - Progressing by Derek Mound, RN Nutrition: Adequate nutrition will be maintained 08/07/2017 2337 - Progressing by Derek Mound, RN

## 2017-08-07 NOTE — [facility] Suicide Risk Assessment (Signed)
[place] INPATIENT:  Family/Significant Other Suicide Prevention Education  Suicide Prevention Education:  Education Completed; [name], pt's aunt, at [phone] has been identified by the patient as the family member/significant other with whom the patient will be residing, and identified as the person(s) who will aid the patient in the event of a mental health crisis (suicidal ideations/suicide attempt).  With written consent from the patient, the family member/significant other has been provided the following suicide prevention education, prior to the and/or following the discharge of the patient.  The suicide prevention education provided includes the following:  Suicide risk factors  Suicide prevention and interventions  National Suicide Hotline telephone number  [facility] assessment telephone number  [facility] Emergency Assistance [place] and/or Residential Mobile Crisis Unit telephone number  Request made of family/significant other to:  Remove weapons (e.g., guns, rifles, knives), all items previously/currently identified as safety concern.    Remove drugs/medications (over-the-counter, prescriptions, illicit drugs), all items previously/currently identified as a safety concern.  The family member/significant other verbalizes understanding of the suicide prevention education information provided.  The family member/significant other agrees to remove the items of safety concern listed above.  Pt's aunt added, "I'm concerned that he's so depressed. He has no motivation at all. I don't know if he's been taking his meds--he's a grown man. I noticed that all of a sudden he stopped acting like himself--he was withdrawn. He slept all the time. He didn't have anything to do with his baby (72 month old). I don't know how to answer that (about hallucinations). He only mentioned that to me once. I have health problems, and we just found out I have kidney  cancer. So, I'm starting to feel like that brought a lot of this on."    Alden Hipp, LCSW 08/07/2017, 9:19 AM

## 2017-08-07 NOTE — Progress Notes (Signed)
Recreation Therapy Notes  INPATIENT RECREATION THERAPY ASSESSMENT  Patient Details Name: DEMPSY DAMIANO MRN: [id number] DOB: July 31, 1990 Today's Date: 08/07/2017  Patient Stressors: Other (Comment)(My anger and anxiety)  Coping Skills:   Isolate, Avoidance, Other (Comment)(Walking away,Breathing)  Personal Challenges: Anger, Stress Management, Trusting Others  Leisure Interests (2+):  Petra Kuba - Fishing, Therapist, music - Leisure centre manager Resources:  No  Community Resources:     Current Use:    If no, Barriers?:    Patient Strengths:  Respectful  Patient Identified Areas of Improvement:  Improve on my anger  Current Recreation Participation:  None  Patient Goal for Hospitalization:  To not come back and get better.  City of Residence:  [place] of Residence:  [place]   Current SI (including self-harm):  No  Current HI:  No  Consent to Intern Participation: N/A   Elese Rane 08/07/2017, 2:47 PM

## 2017-08-07 NOTE — Progress Notes (Signed)
D: Patient stated slept good last night .Stated appetite is good and energy level  Is normal. Stated concentration is good . Stated on Depression scale 7 , hopeless 5 and anxiety 9 .( low 0-10 high) Denies suicidal  homicidal ideations  .  No auditory hallucinations  No pain concerns . Appropriate ADL'S. Interacting with peers and staff.  No unit participation   With unit programing  Voice of no  Goal today Isolatory to  Room  A: Encourage patient participation with unit programming . Instruction  Given on  Medication , verbalize understanding. R: Voice no other concerns. Staff continue to monitor

## 2017-08-07 NOTE — [facility] Group Notes (Signed)
[place] Group Notes:  (Nursing/MHT/Case Management/Adjunct)  Date:  08/07/2017  Time:  3:12 PM  Type of Therapy:  Psychoeducational Skills  Participation Level:  Did Not Attend  Alois Cliche 08/07/2017, 3:12 PM

## 2017-08-07 NOTE — Progress Notes (Signed)
Recreation Therapy Notes  Date: 01.09.2019  Time: 9:30 am  Location: Craft Room  Behavioral response: N/A  Intervention Topic: Creative Expressions  Discussion/Intervention: Patient did not attend group. Clinical Observations/Feedback:  Patient did not attend  group.  Reisha Wos LRT/CTRS          [name] 08/07/2017 11:37 AM

## 2017-08-07 NOTE — Tx Team (Signed)
Initial Treatment Plan 08/07/2017 2:02 AM [name] ZYS:[id number]    PATIENT STRESSORS: Financial difficulties Health problems Medication change or noncompliance Occupational concerns   PATIENT STRENGTHS: Average or above average intelligence Capable of independent living Communication skills Motivation for treatment/growth Physical Health   PATIENT IDENTIFIED PROBLEMS: Suicidal thoughts    Depression    Auditory Hallucination    Anxiety          DISCHARGE CRITERIA:  Ability to meet basic life and health needs Adequate post-discharge living arrangements Improved stabilization in mood, thinking, and/or behavior Medical problems require only outpatient monitoring Motivation to continue treatment in a less acute level of care Need for constant or close observation no longer present  PRELIMINARY DISCHARGE PLAN: Attend aftercare/continuing care group Attend 12-step recovery group Outpatient therapy Participate in family therapy Return to previous living arrangement  PATIENT/FAMILY INVOLVEMENT: This treatment plan has been presented to and reviewed with the patient, Carl Martinez,   The patient  have been given the opportunity to ask questions and make suggestions.  Clemens Catholic, RN 08/07/2017, 2:02 AM
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
## 2017-08-07 NOTE — B[facility] Suicide Risk Assessment (Signed)
[facility] Admission Suicide Risk Assessment   Nursing information obtained from:    Demographic factors:   caucasion, lives with multiple family members Current Mental Status:   depressed Loss Factors:   n/a Historical Factors:   history of depression Risk Reduction Factors:   postiive support, willingness for help  Total Time spent with patient: 45 minutes Principal Problem: Schizoaffective disorder, bipolar type ([place]) Diagnosis:   Patient Active Problem List   Diagnosis Date Noted  . Non-compliance with treatment [Z91.19] 08/07/2017  . Schizoaffective disorder, bipolar type ([place]) [F25.0] 04/24/2017  . MDD (major depressive disorder), recurrent episode, moderate ([place]) [F33.1] 12/26/2016  . Chronic back pain [M54.9, G89.29] 12/25/2016  . PTSD (post-traumatic stress disorder) [F43.10] 12/18/2016  . GERD (gastroesophageal reflux disease) [K21.9] 12/18/2016  . Crohn's disease ([place]) [K50.90] 12/12/2016  . Tobacco use disorder [F17.200] 04/05/2016   Subjective Data: See H&P  Continued Clinical Symptoms:  Alcohol Use Disorder Identification Test Final Score (AUDIT): 7 The "Alcohol Use Disorders Identification Test", Guidelines for Use in Primary Care, Second Edition.  [employer] [facility]). Score between 0-7:  no or low risk or alcohol related problems. Score between 8-15:  moderate risk of alcohol related problems. Score between 16-19:  high risk of alcohol related problems. Score 20 or above:  warrants further diagnostic evaluation for alcohol dependence and treatment.   CLINICAL FACTORS:   Depression:   Anhedonia Hopelessness      COGNITIVE FEATURES THAT CONTRIBUTE TO RISK:  None    SUICIDE RISK:   Moderate:  Frequent suicidal ideation with limited intensity, and duration, some specificity in terms of plans, no associated intent, good self-control, limited dysphoria/symptomatology, some risk factors present, and identifiable protective factors, including available and  accessible social support.  PLAN OF CARE: See H&P  I certify that inpatient services furnished can reasonably be expected to improve the patient's condition.   Marylin Crosby, MD 08/07/2017, 3:30 PM

## 2017-08-08 MED ORDER — TRAMADOL HCL 50 MG PO TABS
50.0000 mg | ORAL_TABLET | Freq: Four times a day (QID) | ORAL | Status: DC | PRN
  Administered 2017-08-08 – 2017-08-10 (×4): 50 mg via ORAL
  Filled 2017-08-08 (×6): qty 1

## 2017-08-08 MED ORDER — HYDROXYZINE HCL 50 MG PO TABS
50.0000 mg | ORAL_TABLET | ORAL | Status: DC | PRN
  Administered 2017-08-08 – 2017-08-12 (×10): 50 mg via ORAL
  Filled 2017-08-08 (×10): qty 1

## 2017-08-08 NOTE — Plan of Care (Signed)
  Progressing Education: Ability to make informed decisions regarding treatment will improve 08/08/2017 1449 - Progressing by Leodis Liverpool, RN Self-Concept: Ability to disclose and discuss suicidal ideas will improve 08/08/2017 1449 - Progressing by Leodis Liverpool, RN Ability to verbalize positive feelings about self will improve 08/08/2017 1449 - Progressing by Leodis Liverpool, RN Activity: Interest or engagement in leisure activities will improve 08/08/2017 1449 - Progressing by Leodis Liverpool, RN Imbalance in normal sleep/wake cycle will improve 08/08/2017 1449 - Progressing by Leodis Liverpool, RN Interest or engagement in leisure activities will improve 08/08/2017 1449 - Progressing by Leodis Liverpool, RN Education: Utilization of techniques to improve thought processes will improve 08/08/2017 1449 - Progressing by Leodis Liverpool, RN Knowledge of the prescribed therapeutic regimen will improve 08/08/2017 1449 - Progressing by Leodis Liverpool, RN Coping: Ability to cope will improve 08/08/2017 1449 - Progressing by Leodis Liverpool, RN Ability to verbalize feelings will improve 08/08/2017 1449 - Progressing by Leodis Liverpool, RN Health Behavior/Discharge Planning: Ability to make decisions will improve 08/08/2017 1449 - Progressing by Leodis Liverpool, RN Compliance with therapeutic regimen will improve 08/08/2017 1449 - Progressing by Leodis Liverpool, RN Self-Concept: Ability to identify factors that promote anxiety will improve 08/08/2017 1449 - Progressing by Leodis Liverpool, RN Level of anxiety will decrease 08/08/2017 1449 - Progressing by Leodis Liverpool, RN Ability to modify response to factors that promote anxiety will improve 08/08/2017 1449 - Progressing by Leodis Liverpool, RN Education: Knowledge of [facility] Education information/materials will improve 08/08/2017 1449 - Progressing by Leodis Liverpool, RN

## 2017-08-08 NOTE — Progress Notes (Signed)
Recreation Therapy Notes   Date: 01.10.2019  Time: 1:00pm  Location: Craft Room  Behavioral response: Appropriate  Intervention Topic: Stress  Discussion/Intervention: Group content on today was focused on stress. The group defined stress and way to cope with stress. Participants expressed how they know when they are stresses out. Individuals described the different ways they have to cope with stress. The group stated reasons why it is important to cope with stress. Patient explained what good stress is and some examples. The group participated in the intervention "Dealing with stress". Individuals had a chance to discuss positive and negative ways to deal with certain stressful situations.  Clinical Observations/Feedback:  Patient came to group and was focused on what his peers and staff had to say about stress. He participated in the intervention and continues to work toward his goal. Pharmacist, community Davetta Olliff LRT/CTRS            Salvatore Shear 08/08/2017 1:47 PM

## 2017-08-08 NOTE — Plan of Care (Signed)
  Coping: Ability to cope will improve 08/08/2017 2101 - Progressing by [place] Crosby, RN Note Pt interacting with peers on unit

## 2017-08-08 NOTE — B[facility] Group Notes (Signed)
[facility] Group Notes:  (Nursing/MHT/Case Management/Adjunct)  Date:  08/08/2017  Time:  5:33 PM  Type of Therapy:  Psychoeducational Skills  Participation Level:  Active  Participation Quality:  Appropriate and Attentive  Affect:  Appropriate  Cognitive:  Alert and Appropriate  Insight:  Appropriate and Good  Engagement in Group:  Engaged  Modes of Intervention:  Discussion and Education  Summary of Progress/Problems:  Carl Martinez 08/08/2017, 5:33 PM

## 2017-08-08 NOTE — [facility] Group Notes (Signed)
08/08/2017 9:30AM  Type of Therapy/Topic:  Group Therapy:  Balance in Life  Participation Level:  Did Not Attend  Description of Group:   This group will address the concept of balance and how it feels and looks when one is unbalanced. Patients will be encouraged to process areas in their lives that are out of balance and identify reasons for remaining unbalanced. Facilitators will guide patients in utilizing problem-solving interventions to address and correct the stressor making their life unbalanced. Understanding and applying boundaries will be explored and addressed for obtaining and maintaining a balanced life. Patients will be encouraged to explore ways to assertively make their unbalanced needs known to significant others in their lives, using other group members and facilitator for support and feedback.  Therapeutic Goals: 1. Patient will identify two or more emotions or situations they have that consume much of in their lives. 2. Patient will identify signs/triggers that life has become out of balance:  3. Patient will identify two ways to set boundaries in order to achieve balance in their lives:  4. Patient will demonstrate ability to communicate their needs through discussion and/or role plays  Summary of Patient Progress:  Patient was encouraged and invited to attend group. Patient did not attend group. Social worker will continue to encourage group participation in the future.        Therapeutic Modalities:   Cognitive Behavioral Therapy Solution-Focused Therapy Assertiveness Training  Darin Engels, [place]

## 2017-08-08 NOTE — Progress Notes (Signed)
[facility] MD Progress Note  08/08/2017 4:07 PM [place]  MRN:  [id number]   Subjective:  Pt has been observed socializing and laughing with peers this afternoon in the milieu. He states that he is feeling slightly better and less depressed today. He feels the change from Luvox to Lexapro has been helpful. He states "I feel very angry still and homicidal." He states, "No towards anyone in particular but just mad." His affect is very incongruent with this and is very calm and smiling during interview. He states that Depakote is not helping. Discussed that his Depakote level was undetectable meaning that he has not been compliant with this. He continues to reports SI with plan to hang himself but these thoughts are less frequently. He states that the voices are improved and no longer hearing them. HE slept well last night. He feels anxious and states, "It is always bad." He is very calm and does not appear anxious at all.   Principal Problem: Schizoaffective disorder, bipolar type ([place]) Diagnosis:   Patient Active Problem List   Diagnosis Date Noted  . Non-compliance with treatment [Z91.19] 08/07/2017  . Schizoaffective disorder, bipolar type ([place]) [F25.0] 04/24/2017  . MDD (major depressive disorder), recurrent episode, moderate ([place]) [F33.1] 12/26/2016  . Chronic back pain [M54.9, G89.29] 12/25/2016  . PTSD (post-traumatic stress disorder) [F43.10] 12/18/2016  . GERD (gastroesophageal reflux disease) [K21.9] 12/18/2016  . Crohn's disease ([place]) [K50.90] 12/12/2016  . Tobacco use disorder [F17.200] 04/05/2016   Total Time spent with patient: 20 minutes  Past Psychiatric History: See H&P  Past Medical History:  Past Medical History:  Diagnosis Date  . Anxiety   . Bipolar 1 disorder ([place])   . Crohn disease ([place])   . Depression   . Renal disorder     Past Surgical History:  Procedure Laterality Date  . CHOLECYSTECTOMY     Family History: History reviewed. No pertinent family  history. Family Psychiatric  History: See H&P Social History:  Social History   Substance and Sexual Activity  Alcohol Use No     Social History   Substance and Sexual Activity  Drug Use No    Social History   Socioeconomic History  . Marital status: Legally Separated    Spouse name: None  . Number of children: None  . Years of education: None  . Highest education level: None  Social Needs  . Financial resource strain: None  . Food insecurity - worry: None  . Food insecurity - inability: None  . Transportation needs - medical: None  . Transportation needs - non-medical: None  Occupational History  . None  Tobacco Use  . Smoking status: Current Every Day Smoker    Packs/day: 1.50    Years: 10.00    Pack years: 15.00    Types: Cigarettes  . Smokeless tobacco: Never Used  Substance and Sexual Activity  . Alcohol use: No  . Drug use: No  . Sexual activity: Yes    Birth control/protection: None  Other Topics Concern  . None  Social History Narrative  . None   Additional Social History:    Pain Medications: See MAR Prescriptions: See MAR Over the Counter: See MAR History of alcohol / drug use?: No history of alcohol / drug abuse                    Sleep: Good  Appetite:  Good  Current Medications: Current Facility-Administered Medications  Medication Dose Route Frequency Provider Last Rate  Last Dose  . acetaminophen (TYLENOL) tablet 650 mg  650 mg Oral Q6H PRN Pucilowska, Jolanta B, MD   650 mg at 08/08/17 1223  . alum & mag hydroxide-simeth (MAALOX/MYLANTA) 200-200-20 MG/5ML suspension 30 mL  30 mL Oral Q4H PRN Pucilowska, Jolanta B, MD      . Derrill Memo ON 08/23/2017] ARIPiprazole ER SRER 400 mg  400 mg Intramuscular Once Ala Capri R, MD      . divalproex (DEPAKOTE) DR tablet 500 mg  500 mg Oral Q12H Collie Kittel, Tyson Babinski, MD   500 mg at 08/08/17 0811  . escitalopram (LEXAPRO) tablet 10 mg  10 mg Oral Daily Elvira Langston, Tyson Babinski, MD   10 mg at 08/08/17 0811  .  gabapentin (NEURONTIN) tablet 1,200 mg  1,200 mg Oral TID Marylin Crosby, MD   1,200 mg at 08/08/17 1222  . hydrOXYzine (ATARAX/VISTARIL) tablet 50 mg  50 mg Oral Q6H PRN Pucilowska, Jolanta B, MD   50 mg at 08/07/17 2033  . magnesium hydroxide (MILK OF MAGNESIA) suspension 30 mL  30 mL Oral Daily PRN Pucilowska, Jolanta B, MD      . nicotine (NICODERM CQ - dosed in mg/24 hours) patch 21 mg  21 mg Transdermal Q0600 Pucilowska, Jolanta B, MD   21 mg at 08/08/17 9323  . pantoprazole (PROTONIX) EC tablet 40 mg  40 mg Oral Daily Daveon Arpino, Tyson Babinski, MD   40 mg at 08/08/17 0811  . traZODone (DESYREL) tablet 200 mg  200 mg Oral QHS Vallie Teters, Tyson Babinski, MD   200 mg at 08/07/17 2110  . zolpidem (AMBIEN) tablet 10 mg  10 mg Oral QHS Marylin Crosby, MD   10 mg at 08/07/17 2110    Lab Results:  Results for orders placed or performed during the hospital encounter of 08/06/17 (from the past 48 hour(s))  Comprehensive metabolic panel     Status: Abnormal   Collection Time: 08/06/17  5:31 PM  Result Value Ref Range   Sodium 139 135 - 145 mmol/L   Potassium 3.7 3.5 - 5.1 mmol/L   Chloride 103 101 - 111 mmol/L   CO2 26 22 - 32 mmol/L   Glucose, Bld 116 (H) 65 - 99 mg/dL   BUN 12 6 - 20 mg/dL   Creatinine, Ser 1.08 0.61 - 1.24 mg/dL   Calcium 9.4 8.9 - 10.3 mg/dL   Total Protein 7.3 6.5 - 8.1 g/dL   Albumin 4.1 3.5 - 5.0 g/dL   AST 41 15 - 41 U/L   ALT 75 (H) 17 - 63 U/L   Alkaline Phosphatase 90 38 - 126 U/L   Total Bilirubin <0.1 (L) 0.3 - 1.2 mg/dL   GFR calc non Af Amer >60 >60 mL/min   GFR calc Af Amer >60 >60 mL/min    Comment: (NOTE) The eGFR has been calculated using the CKD EPI equation. This calculation has not been validated in all clinical situations. eGFR's persistently <60 mL/min signify possible Chronic Kidney Disease.    Anion gap 10 5 - 15    Comment: Performed at [facility], [address] [place], [place] [address]  Ethanol     Status: None   Collection Time: 08/06/17  5:31  PM  Result Value Ref Range   Alcohol, Ethyl (B) <10 <10 mg/dL    Comment:        LOWEST DETECTABLE LIMIT FOR SERUM ALCOHOL IS 10 mg/dL FOR MEDICAL PURPOSES ONLY Performed at [facility], [address],  [place], [place] [address]   Salicylate level     Status: None   Collection Time: 08/06/17  5:31 PM  Result Value Ref Range   Salicylate Lvl <3.2 2.8 - 30.0 mg/dL    Comment: Performed at [facility], [address] [place], [place] [address]  Acetaminophen level     Status: Abnormal   Collection Time: 08/06/17  5:31 PM  Result Value Ref Range   Acetaminophen (Tylenol), Serum <10 (L) 10 - 30 ug/mL    Comment:        THERAPEUTIC CONCENTRATIONS VARY SIGNIFICANTLY. A RANGE OF 10-30 ug/mL MAY BE AN EFFECTIVE CONCENTRATION FOR MANY PATIENTS. HOWEVER, SOME ARE BEST TREATED AT CONCENTRATIONS OUTSIDE THIS RANGE. ACETAMINOPHEN CONCENTRATIONS >150 ug/mL AT 4 HOURS AFTER INGESTION AND >50 ug/mL AT 12 HOURS AFTER INGESTION ARE OFTEN ASSOCIATED WITH TOXIC REACTIONS. Performed at [facility], [address] [place], [place] [address]   cbc     Status: Abnormal   Collection Time: 08/06/17  5:31 PM  Result Value Ref Range   WBC 11.8 (H) 3.8 - 10.6 K/uL   RBC 5.23 4.40 - 5.90 MIL/uL   Hemoglobin 16.6 13.0 - 18.0 g/dL   HCT 48.1 40.0 - 52.0 %   MCV 92.0 80.0 - 100.0 fL   MCH 31.8 26.0 - 34.0 pg   MCHC 34.6 32.0 - 36.0 g/dL   RDW 13.7 11.5 - 14.5 %   Platelets 205 150 - 440 K/uL    Comment: Performed at [facility], [address], [place], [place] [address]  Urine Drug Screen, Qualitative     Status: Abnormal   Collection Time: 08/06/17  5:31 PM  Result Value Ref Range   Tricyclic, Ur Screen NONE DETECTED NONE DETECTED   Amphetamines, Ur Screen NONE DETECTED NONE DETECTED   MDMA (Ecstasy)Ur Screen NONE DETECTED NONE DETECTED   Cocaine Metabolite,Ur [place] NONE DETECTED NONE DETECTED   Opiate, Ur Screen NONE DETECTED NONE DETECTED    Phencyclidine (PCP) Ur S NONE DETECTED NONE DETECTED   Cannabinoid 50 Ng, Ur [place] NONE DETECTED NONE DETECTED   Barbiturates, Ur Screen NONE DETECTED NONE DETECTED   Benzodiazepine, Ur Scrn POSITIVE (A) NONE DETECTED   Methadone Scn, Ur NONE DETECTED NONE DETECTED    Comment: (NOTE) Tricyclics + metabolites, urine    Cutoff 1000 ng/mL Amphetamines + metabolites, urine  Cutoff 1000 ng/mL MDMA (Ecstasy), urine              Cutoff 500 ng/mL Cocaine Metabolite, urine          Cutoff 300 ng/mL Opiate + metabolites, urine        Cutoff 300 ng/mL Phencyclidine (PCP), urine         Cutoff 25 ng/mL Cannabinoid, urine                 Cutoff 50 ng/mL Barbiturates + metabolites, urine  Cutoff 200 ng/mL Benzodiazepine, urine              Cutoff 200 ng/mL Methadone, urine                   Cutoff 300 ng/mL The urine drug screen provides only a preliminary, unconfirmed analytical test result and should not be used for non-medical purposes. Clinical consideration and professional judgment should be applied to any positive drug screen result due to possible interfering substances. A more specific alternate chemical method must be used in order to obtain a confirmed analytical result. Gas chromatography /  mass spectrometry (GC/MS) is the preferred confirmat ory method. Performed at [facility], [address] [place], [place] [address]   Valproic acid level     Status: Abnormal   Collection Time: 08/06/17  5:31 PM  Result Value Ref Range   Valproic Acid Lvl <10 (L) 50.0 - 100.0 ug/mL    Comment: Performed at [facility], [address] [place], [place] [address]    Blood Alcohol level:  Lab Results  Component Value Date   [facility] <10 08/06/2017   ETH <10 60/04/9322    Metabolic Disorder Labs: Lab Results  Component Value Date   HGBA1C 5.2 04/25/2017   MPG 102.54 04/25/2017   MPG 100 12/25/2016   No results found for: PROLACTIN Lab Results  Component Value Date    CHOL 263 (H) 04/25/2017   TRIG 396 (H) 04/25/2017   HDL 31 (L) 04/25/2017   CHOLHDL 8.5 04/25/2017   VLDL 79 (H) 04/25/2017   LDLCALC 153 (H) 04/25/2017   LDLCALC 133 (H) 12/25/2016    Physical Findings: AIMS: Facial and Oral Movements Muscles of Facial Expression: None, normal Lips and Perioral Area: None, normal Jaw: None, normal Tongue: None, normal,Extremity Movements Upper (arms, wrists, hands, fingers): None, normal Lower (legs, knees, ankles, toes): None, normal, Trunk Movements Neck, shoulders, hips: None, normal, Overall Severity Severity of abnormal movements (highest score from questions above): None, normal Incapacitation due to abnormal movements: None, normal Patient's awareness of abnormal movements (rate only patient's report): No Awareness, Dental Status Current problems with teeth and/or dentures?: No Does patient usually wear dentures?: No  CIWA:  CIWA-Ar Total: 2 COWS:  COWS Total Score: 3  Musculoskeletal: Strength & Muscle Tone: within normal limits Gait & Station: normal Patient leans: N/A  Psychiatric Specialty Exam: Physical Exam  Nursing note and vitals reviewed.   ROS  Blood pressure 131/74, pulse 83, temperature (!) 97.4 F (36.3 C), temperature source Oral, resp. rate 18, height 6' 1"  (1.854 m), weight (!) 138.8 kg (306 lb), SpO2 97 %.Body mass index is 40.37 kg/m.  General Appearance: Casual  Eye Contact:  Good  Speech:  Clear and Coherent  Volume:  Normal  Mood:  Depressed, "angry"  Affect: incongruent, laughing socializing with peers, does not appear irritable or angry  Thought Process:  Coherent and Goal Directed  Orientation:  Full (Time, Place, and Person)  Thought Content:  Logical  Suicidal Thoughts:  Yes.  with intent/plan  Homicidal Thoughts:  Yes.  without intent/plan  Memory:  Immediate;   Fair  Judgement:  Fair  Insight:  Fair  Psychomotor Activity:  Normal  Concentration:  Concentration: Fair  Recall:  [employer] of  Knowledge:  Fair  Language:  Fair  Akathisia:  No      Assets:  Communication Skills Resilience  ADL's:  Intact  Cognition:  WNL  Sleep:  Number of Hours: 7.25     Treatment Plan Summary: 27 yo male admitted due to worsen depression and AH. AH are improving and mood is improving. He states that he is "very angry and feels homicidal." He denies any plan or intent of hurting others and no target. His affect is very incongruent with these statements and is very pleasant, is not irritable at all during interview, and is observed by this writer to be laughing and joking with peers in the milieu prior to interview today. HE feels mood is less depressed and SI is resolving.  Plan:  Schizoaffective disorder -Last Abilify injection was  400 mg on 12/28. This was confirmed with [facility] -Continue Lexapro 10 mg daily -Continue Depakote 500 mg BID  Insomnia -Trazodone 200 mg qhs -Ambien 10 mg qhs  Pain/anxiety -Gabapentin 1200 mg TID  Dispo -He will return home on discharge and follow up with [facility]  Marylin Crosby, MD 08/08/2017, 4:07 PM

## 2017-08-08 NOTE — Progress Notes (Signed)
D:No group participation , Patient does have the ability to make decision  , aware of  medication received . No concerns voice around sleep and wake cycle . Information given on coping skills .  Verbalize understanding of [place] information. No issues concerning  nutrition .  No concerns around body functions .  Patient stated slept fair last night .Stated appetite fair and energy level  Is normal. Stated concentration is good . Stated on Depression scale 7 , hopeless 3 and anxiety 6 .( low 0-10 high) Denies suicidal  homicidal ideations  .  No auditory hallucinations  No pain concerns . Appropriate ADL'S. Limited  Interacting with peers and staff.  No unit programing  This shift . Lying in bed most of shift. Voce of broken tooth in front of mouth  From french fry  Causing pain .  A: Encourage patient participation with unit programming . Instruction  Given on  Medication , verbalize understanding.  R: Voice no other concerns. Staff continue to monitor

## 2017-08-08 NOTE — [facility] Group Notes (Signed)
  08/08/2017  Time: 0900  Type of Therapy and Topic:  Group Therapy:  Setting Goals Participation Level:  Did Not Attend  Description of Group: In this process group, patients discussed using strengths to work toward goals and address challenges.  Patients identified two positive things about themselves and one goal they were working on.  Patients were given the opportunity to share openly and support each other's plan for self-empowerment.  The group discussed the value of gratitude and were encouraged to have a daily reflection of positive characteristics or circumstances.  Patients were encouraged to identify a plan to utilize their strengths to work on current challenges and goals.  Therapeutic Goals 1. Patient will verbalize personal strengths/positive qualities and relate how these can assist with achieving desired personal goals 2. Patients will verbalize affirmation of peers plans for personal change and goal setting 3. Patients will explore the value of gratitude and positive focus as related to successful achievement of goals 4. Patients will verbalize a plan for regular reinforcement of personal positive qualities and circumstances.  Summary of Patient Progress: Pt was invited to attend group but chose not to attend. CSW will continue to encourage pt to attend group throughout their admission.    Therapeutic Modalities Cognitive Behavioral Therapy Motivational Interviewing   Alden Hipp, MSW, LCSW 08/08/2017 9:26 AM

## 2017-08-08 NOTE — Progress Notes (Signed)
D: Pt denies SI/HI/AVH. Pt is pleasant and cooperative. Pt stated he was doing better due to his medications. Pt visible on the unit interacting appropriately with peers.   A: Pt was offered support and encouragement. Pt was given scheduled medications. Pt was encourage to attend groups. Q 15 minute checks were done for safety.   R:Pt attends groups and interacts well with peers and staff. Pt is taking medication. Pt has no complaints, but chipped tooth .Pt receptive to treatment and safety maintained on unit.

## 2017-08-09 MED ORDER — OXYCODONE-ACETAMINOPHEN 5-325 MG PO TABS
1.0000 | ORAL_TABLET | Freq: Once | ORAL | Status: AC
  Administered 2017-08-09: 1 via ORAL
  Filled 2017-08-09: qty 1

## 2017-08-09 NOTE — Progress Notes (Signed)
D- Patient alert and oriented. Patient presents in a pleasant mood on assessment stating that slept alright he just wants something for his pain. Patient reports a pain level of "7/10" stating that he has a toothache, "my tooth broke off in my gumline". Patient rates his depression a "5/10, because I'm here" and his anxiety level "7/10" but states to this writer that he doesn't know why he's anxious. Patient denies SI, HI, AVH, at this time. Patient's goal for today is "going home" and he will work on this goal by "depakote levels".  A- Scheduled medications administered to patient, per MD orders. Support and encouragement provided.  Routine safety checks conducted every 15 minutes.  Patient informed to notify staff with problems or concerns.  R- No adverse drug reactions noted. Patient contracts for safety at this time. Patient compliant with medications and treatment plan. Patient receptive, calm, and cooperative. Patient interacts well with others on the unit.  Patient remains safe at this time.

## 2017-08-09 NOTE — Progress Notes (Signed)
Recreation Therapy Notes  Date: 01.11.2019  Time: 1:00 PM  Location: Craft Room  Behavioral response: Appropriate  Intervention Topic: Leisure  Discussion/Intervention: Group content today was focused on leisure. The group defined what leisure is and some positive leisure activities they participate in. Individuals identified the difference between good and bad leisure. Participants expressed how they feel after participating in the leisure of their choice. The group discussed how they go about picking a leisure activity and if others are involved in their leisure activities. The patient stated how many leisure activities they too choose from and reasons why it is important to have leisure time. Individuals participated in the intervention "Leisure Jeopardy" where they had a chance to identify new leisure activities as well as benefits of leisure. Clinical Observations/Feedback:  Patient came to group and identified hunting, fishing and shooting guns as leisure activities he participates in. He stated that it is a stress reliever for him to participated in  these activities. Individual will continue to make progress towards his goals. Ashly Goethe LRT/CTRS         Spiro Ausborn 08/09/2017 2:31 PM

## 2017-08-09 NOTE — B[facility] Group Notes (Signed)
08/09/2017 9:30AM  Type of Therapy and Topic:  Group Therapy:  Feelings around Relapse and Recovery  Participation Level:  Did Not Attend   Description of Group:    Patients in this group will discuss emotions they experience before and after a relapse. They will process how experiencing these feelings, or avoidance of experiencing them, relates to having a relapse. Facilitator will guide patients to explore emotions they have related to recovery. Patients will be encouraged to process which emotions are more powerful. They will be guided to discuss the emotional reaction significant others in their lives may have to patients' relapse or recovery. Patients will be assisted in exploring ways to respond to the emotions of others without this contributing to a relapse.  Therapeutic Goals: 1. Patient will identify two or more emotions that lead to a relapse for them 2. Patient will identify two emotions that result when they relapse 3. Patient will identify two emotions related to recovery 4. Patient will demonstrate ability to communicate their needs through discussion and/or role plays   Summary of Patient Progress:  Patient was encouraged and invited to attend group. Patient did not attend group. Social worker will continue to encourage group participation in the future.     Therapeutic Modalities:   Cognitive Behavioral Therapy Solution-Focused Therapy Assertiveness Training Relapse Prevention Therapy   Darin Engels, [place] 08/09/2017 10:47 AM

## 2017-08-09 NOTE — Progress Notes (Signed)
[facility] MD Progress Note  08/09/2017 1:49 PM Carl Martinez  MRN:  [id number] Subjective:  Pt states that mood continues to improve. He feels much less depressed. He feels that the Zoloft is helping a lot. He denies any AH and these have completely resolved. Denies HI and no longer feels angry. He states that he has been going to groups and they have helped. He has been social with peers on the unit. He chipped his tooth and is having a lot of pain related to that.  Principal Problem: Schizoaffective disorder, bipolar type ([place]) Diagnosis:   Patient Active Problem List   Diagnosis Date Noted  . Non-compliance with treatment [Z91.19] 08/07/2017  . Schizoaffective disorder, bipolar type ([place]) [F25.0] 04/24/2017  . MDD (major depressive disorder), recurrent episode, moderate ([place]) [F33.1] 12/26/2016  . Chronic back pain [M54.9, G89.29] 12/25/2016  . PTSD (post-traumatic stress disorder) [F43.10] 12/18/2016  . GERD (gastroesophageal reflux disease) [K21.9] 12/18/2016  . Crohn's disease ([place]) [K50.90] 12/12/2016  . Tobacco use disorder [F17.200] 04/05/2016   Total Time spent with patient: 20 minutes  Past Psychiatric History: See H&P  Past Medical History:  Past Medical History:  Diagnosis Date  . Anxiety   . Bipolar 1 disorder ([place])   . Crohn disease ([place])   . Depression   . Renal disorder     Past Surgical History:  Procedure Laterality Date  . CHOLECYSTECTOMY     Family History: History reviewed. No pertinent family history. Family Psychiatric  History: See H&P Social History:  Social History   Substance and Sexual Activity  Alcohol Use No     Social History   Substance and Sexual Activity  Drug Use No    Social History   Socioeconomic History  . Marital status: Legally Separated    Spouse name: None  . Number of children: None  . Years of education: None  . Highest education level: None  Social Needs  . Financial resource strain: None  . Food insecurity -  worry: None  . Food insecurity - inability: None  . Transportation needs - medical: None  . Transportation needs - non-medical: None  Occupational History  . None  Tobacco Use  . Smoking status: Current Every Day Smoker    Packs/day: 1.50    Years: 10.00    Pack years: 15.00    Types: Cigarettes  . Smokeless tobacco: Never Used  Substance and Sexual Activity  . Alcohol use: No  . Drug use: No  . Sexual activity: Yes    Birth control/protection: None  Other Topics Concern  . None  Social History Narrative  . None   Additional Social History:    Pain Medications: See MAR Prescriptions: See MAR Over the Counter: See MAR History of alcohol / drug use?: No history of alcohol / drug abuse                    Sleep: Good  Appetite:  Good  Current Medications: Current Facility-Administered Medications  Medication Dose Route Frequency Provider Last Rate Last Dose  . acetaminophen (TYLENOL) tablet 650 mg  650 mg Oral Q6H PRN Pucilowska, Jolanta B, MD   650 mg at 08/09/17 0824  . alum & mag hydroxide-simeth (MAALOX/MYLANTA) 200-200-20 MG/5ML suspension 30 mL  30 mL Oral Q4H PRN Pucilowska, Jolanta B, MD      . Derrill Memo ON 08/23/2017] ARIPiprazole ER SRER 400 mg  400 mg Intramuscular Once McNew, Tyson Babinski, MD      .  divalproex (DEPAKOTE) DR tablet 500 mg  500 mg Oral Q12H McNew, Tyson Babinski, MD   500 mg at 08/09/17 0818  . escitalopram (LEXAPRO) tablet 10 mg  10 mg Oral Daily McNew, Tyson Babinski, MD   10 mg at 08/09/17 0817  . gabapentin (NEURONTIN) tablet 1,200 mg  1,200 mg Oral TID Marylin Crosby, MD   1,200 mg at 08/09/17 1211  . hydrOXYzine (ATARAX/VISTARIL) tablet 50 mg  50 mg Oral Q4H PRN Marylin Crosby, MD   50 mg at 08/08/17 2128  . magnesium hydroxide (MILK OF MAGNESIA) suspension 30 mL  30 mL Oral Daily PRN Pucilowska, Jolanta B, MD      . nicotine (NICODERM CQ - dosed in mg/24 hours) patch 21 mg  21 mg Transdermal Q0600 Pucilowska, Jolanta B, MD   21 mg at 08/09/17 0818  .  oxyCODONE-acetaminophen (PERCOCET/ROXICET) 5-325 MG per tablet 1 tablet  1 tablet Oral Once McNew, Holly R, MD      . pantoprazole (PROTONIX) EC tablet 40 mg  40 mg Oral Daily McNew, Tyson Babinski, MD   40 mg at 08/09/17 0818  . traMADol (ULTRAM) tablet 50 mg  50 mg Oral Q6H PRN Marylin Crosby, MD   50 mg at 08/09/17 0823  . traZODone (DESYREL) tablet 200 mg  200 mg Oral QHS McNew, Holly R, MD   200 mg at 08/08/17 2127  . zolpidem (AMBIEN) tablet 10 mg  10 mg Oral QHS McNew, Tyson Babinski, MD   10 mg at 08/08/17 2127    Lab Results: No results found for this or any previous visit (from the past 48 hour(s)).  Blood Alcohol level:  Lab Results  Component Value Date   ETH <10 08/06/2017   ETH <10 49/17/9150    Metabolic Disorder Labs: Lab Results  Component Value Date   HGBA1C 5.2 04/25/2017   MPG 102.54 04/25/2017   MPG 100 12/25/2016   No results found for: PROLACTIN Lab Results  Component Value Date   CHOL 263 (H) 04/25/2017   TRIG 396 (H) 04/25/2017   HDL 31 (L) 04/25/2017   CHOLHDL 8.5 04/25/2017   VLDL 79 (H) 04/25/2017   LDLCALC 153 (H) 04/25/2017   LDLCALC 133 (H) 12/25/2016    Physical Findings: AIMS: Facial and Oral Movements Muscles of Facial Expression: None, normal Lips and Perioral Area: None, normal Jaw: None, normal Tongue: None, normal,Extremity Movements Upper (arms, wrists, hands, fingers): None, normal Lower (legs, knees, ankles, toes): None, normal, Trunk Movements Neck, shoulders, hips: None, normal, Overall Severity Severity of abnormal movements (highest score from questions above): None, normal Incapacitation due to abnormal movements: None, normal Patient's awareness of abnormal movements (rate only patient's report): No Awareness, Dental Status Current problems with teeth and/or dentures?: No Does patient usually wear dentures?: No  CIWA:  CIWA-Ar Total: 2 COWS:  COWS Total Score: 3  Musculoskeletal: Strength & Muscle Tone: within normal limits Gait &  Station: normal Patient leans: N/A  Psychiatric Specialty Exam: Physical Exam  Nursing note and vitals reviewed.   Review of Systems  All other systems reviewed and are negative.   Blood pressure 130/70, pulse 77, temperature (!) 97.4 F (36.3 C), temperature source Oral, resp. rate 18, height 6' 1"  (1.854 m), weight (!) 138.8 kg (306 lb), SpO2 97 %.Body mass index is 40.37 kg/m.  General Appearance: Casual  Eye Contact:  Good  Speech:  Clear and Coherent  Volume:  Normal  Mood:  Euthymic  Affect:  Appropriate  Thought Process:  Coherent and Goal Directed  Orientation:  Full (Time, Place, and Person)  Thought Content:  Logical  Suicidal Thoughts:  No  Homicidal Thoughts:  No  Memory:  Immediate;   Fair  Judgement:  Fair  Insight:  Fair  Psychomotor Activity:  Normal  Concentration:  Concentration: Fair  Recall:  [employer] of Knowledge:  Fair  Language:  Fair  Akathisia:  No      Assets:  Communication Skills Desire for Improvement Housing Resilience  ADL's:  Intact  Cognition:  WNL  Sleep:  Number of Hours: 6.45     Treatment Plan Summary: 27 yo male admitted due to worsening mood, SI and AH. Symptoms are improving significantly He has bene observed socializing well with peers. SI and HI are resolving.   Plan:  Schizoaffective disorder -Last Abilify injection was 400 mg on 12/28 -Continue LExapro 10 mg daily -Continue Depakote 500 mg BID  Insomnia -Trazodone 232m qhs -Ambien 10 mg qhs  Pain/Anxiety Gabapentin 1200 mg TID  Tooth Pain -one time dose of oxycodone -prn tramadol. He will need to follow up with dentist on discharge  Dispo -He will return home on discharge and follow up with [place] MD 08/09/2017, 1:49 PM

## 2017-08-09 NOTE — Plan of Care (Signed)
Patient denies SI/HI this shift. Patient's safety is maintained on this unit.    Progressing Self-Concept: Ability to disclose and discuss suicidal ideas will improve 08/09/2017 2037 - Progressing by Anson [place], RN Education: Knowledge of [place] Education information/materials will improve 08/09/2017 2037 - Progressing by Anson [place], RN

## 2017-08-09 NOTE — Plan of Care (Signed)
Patient states that he slept ok last night besides his tooth pain. Patient has the ability to make informed decisions regarding his treatment as well as verbalizing positive feelings about himself. Patient denies SI/HI/AVH at this time. Patient verbalizes understanding of his prescribed therapeutic/medication regimen and has been in compliance thus far. Patient has the ability to cope and verbalize his feelings. Patient states that his depression level is a "5/10 because I'm here" and rates his anxiety level a "7/10" and states that it's because of the same thing as his depression. Patient has showed some interest in leisure activity by interacting with other members on the unit. Patient has participated in unit groups without any problems. Patient is safe on the unit at this time.

## 2017-08-10 MED ORDER — TRAMADOL HCL 50 MG PO TABS
100.0000 mg | ORAL_TABLET | Freq: Two times a day (BID) | ORAL | Status: DC | PRN
  Administered 2017-08-10 – 2017-08-12 (×5): 100 mg via ORAL
  Filled 2017-08-10 (×5): qty 2

## 2017-08-10 MED ORDER — OXYCODONE-ACETAMINOPHEN 5-325 MG PO TABS: 1.0000 | ORAL_TABLET | Freq: Two times a day (BID) | ORAL | Status: DC | PRN

## 2017-08-10 MED ORDER — TRAMADOL HCL 50 MG PO TABS
100.0000 mg | ORAL_TABLET | Freq: Four times a day (QID) | ORAL | Status: DC | PRN
  Administered 2017-08-10: 100 mg via ORAL
  Filled 2017-08-10: qty 2

## 2017-08-10 MED ORDER — OXYCODONE-ACETAMINOPHEN 5-325 MG PO TABS
1.0000 | ORAL_TABLET | Freq: Two times a day (BID) | ORAL | Status: AC | PRN
  Administered 2017-08-10 – 2017-08-11 (×3): 1 via ORAL
  Filled 2017-08-10 (×3): qty 1

## 2017-08-10 NOTE — Progress Notes (Signed)
[name] arrived to consult with patient per order requisition received. Patient was not in his room. Patient was participating in a group session. [place] will follow-up at a later time.

## 2017-08-10 NOTE — Plan of Care (Signed)
Patient has the ability to function at an adequate level as well as the ability to make informed decisions about his treatment. Patient denies SI/HI/AVH at this time as well as any signs/symptoms of depression. Patient has been observed out in the milieu interacting well with others on the unit without any issues. Patient states that he slept poorly last night so he has been resting periodically. Patient has verbalized understanding of therapeutic/medication regimen and has been in compliance thus far. Patient's ability to cope is improving. Patient states that his anxiety level is a "7/10" but he doesn't know what is making him anxious. Patient's goal for today is "getting better" which he will accomplish by "groups". Patient has been participating in unit groups today. Patient is safe on the unit at this time.

## 2017-08-10 NOTE — Plan of Care (Signed)
Patient oriented to unit. Patient verbalized no SI/HI at this time. Patient able to voice needs for PRN medications and identify mood.  Progressing Self-Concept: Ability to disclose and discuss suicidal ideas will improve 08/10/2017 1918 - Progressing by Alyson Locket I, RN Education: Knowledge of the prescribed therapeutic regimen will improve 08/10/2017 1918 - Progressing by Anson [place], RN Education: Knowledge of [place] Education information/materials will improve 08/10/2017 1918 - Progressing by Anson [place], RN

## 2017-08-10 NOTE — Progress Notes (Signed)
D- Patient alert and oriented. Patient presents in a pleasant mood on assessment reporting that he slept poorly last night. Patient states that his anxiety level is a "7/10" but he doesn't really know why he's anxious. Patient denies any signs/symptoms of depression as well as SI, HI, AVH, at this time. Patient's goal for today is "getting better" which he will accomplish by "groups".  A- Scheduled medications administered to patient, per MD orders. Support and encouragement provided.  Routine safety checks conducted every 15 minutes.  Patient informed to notify staff with problems or concerns.  R- No adverse drug reactions noted. Patient contracts for safety at this time. Patient compliant with medications and treatment plan. Patient receptive, calm, and cooperative. Patient interacts well with others on the unit.  Patient remains safe at this time.

## 2017-08-10 NOTE — Progress Notes (Signed)
D: Patient denies SI/HI/AVH. Patient verbally contracts for safety. Patient is calm, cooperative and pleasant. Patient is seen in milieu interacting with peers. Patient has no complaints at this time.  A: Patient was assessed by this nurse. Patient was oriented to unit. Patient's safety was maintained on unit. Q x 15 minute observation checks were completed for safety. Patient care plan was reviewed. Patient was offered support and encouragement. Patient was encourage to attend groups, participate in unit activities and continue with plan of care.   R: Patient has no complaints of pain at this time. Patient is receptive to treatment and safety maintained on unit. Patient slept: 7 hrs 45 minutes.

## 2017-08-10 NOTE — Progress Notes (Signed)
[facility] MD Progress Note  08/10/2017 1:10 PM [place]  MRN:  [id number] Subjective:   Carl Martinez is laying in bed.  He is friendly.  He states his tooth hurts something awful.  8/10 tooth pain, 7/10 anxiety, states the anxiety is secondary to tooth pain.  Otherwise denies depression, denies si/hi/avh.    Principal Problem: Schizoaffective disorder, bipolar type ([place]) Diagnosis:   Patient Active Problem List   Diagnosis Date Noted  . Non-compliance with treatment [Z91.19] 08/07/2017  . Schizoaffective disorder, bipolar type ([place]) [F25.0] 04/24/2017  . MDD (major depressive disorder), recurrent episode, moderate ([place]) [F33.1] 12/26/2016  . Chronic back pain [M54.9, G89.29] 12/25/2016  . PTSD (post-traumatic stress disorder) [F43.10] 12/18/2016  . GERD (gastroesophageal reflux disease) [K21.9] 12/18/2016  . Crohn's disease ([place]) [K50.90] 12/12/2016  . Tobacco use disorder [F17.200] 04/05/2016   Total Time spent with patient: 20 minutes  Past Psychiatric History: See H&P  Past Medical History:  Past Medical History:  Diagnosis Date  . Anxiety   . Bipolar 1 disorder ([place])   . Crohn disease ([place])   . Depression   . Renal disorder     Past Surgical History:  Procedure Laterality Date  . CHOLECYSTECTOMY     Family History: History reviewed. No pertinent family history. Family Psychiatric  History: See H&P Social History:  Social History   Substance and Sexual Activity  Alcohol Use No     Social History   Substance and Sexual Activity  Drug Use No    Social History   Socioeconomic History  . Marital status: Legally Separated    Spouse name: None  . Number of children: None  . Years of education: None  . Highest education level: None  Social Needs  . Financial resource strain: None  . Food insecurity - worry: None  . Food insecurity - inability: None  . Transportation needs - medical: None  . Transportation needs - non-medical: None  Occupational History  .  None  Tobacco Use  . Smoking status: Current Every Day Smoker    Packs/day: 1.50    Years: 10.00    Pack years: 15.00    Types: Cigarettes  . Smokeless tobacco: Never Used  Substance and Sexual Activity  . Alcohol use: No  . Drug use: No  . Sexual activity: Yes    Birth control/protection: None  Other Topics Concern  . None  Social History Narrative  . None   Additional Social History:    Pain Medications: See MAR Prescriptions: See MAR Over the Counter: See MAR History of alcohol / drug use?: No history of alcohol / drug abuse                    Sleep: Good  Appetite:  Good  Current Medications: Current Facility-Administered Medications  Medication Dose Route Frequency Provider Last Rate Last Dose  . acetaminophen (TYLENOL) tablet 650 mg  650 mg Oral Q6H PRN Pucilowska, Jolanta B, MD   650 mg at 08/10/17 0906  . alum & mag hydroxide-simeth (MAALOX/MYLANTA) 200-200-20 MG/5ML suspension 30 mL  30 mL Oral Q4H PRN Pucilowska, Jolanta B, MD      . Derrill Memo ON 08/23/2017] ARIPiprazole ER SRER 400 mg  400 mg Intramuscular Once McNew, Holly R, MD      . divalproex (DEPAKOTE) DR tablet 500 mg  500 mg Oral Q12H McNew, Tyson Babinski, MD   500 mg at 08/10/17 0900  . escitalopram (LEXAPRO) tablet 10 mg  10 mg Oral  Daily McNew, Tyson Babinski, MD   10 mg at 08/10/17 0900  . gabapentin (NEURONTIN) tablet 1,200 mg  1,200 mg Oral TID Marylin Crosby, MD   1,200 mg at 08/10/17 1216  . hydrOXYzine (ATARAX/VISTARIL) tablet 50 mg  50 mg Oral Q4H PRN Marylin Crosby, MD   50 mg at 08/09/17 2102  . magnesium hydroxide (MILK OF MAGNESIA) suspension 30 mL  30 mL Oral Daily PRN Pucilowska, Jolanta B, MD      . nicotine (NICODERM CQ - dosed in mg/24 hours) patch 21 mg  21 mg Transdermal Q0600 Pucilowska, Jolanta B, MD   21 mg at 08/10/17 0900  . pantoprazole (PROTONIX) EC tablet 40 mg  40 mg Oral Daily McNew, Tyson Babinski, MD   40 mg at 08/10/17 0900  . traMADol (ULTRAM) tablet 50 mg  50 mg Oral Q6H PRN Marylin Crosby, MD   50 mg at 08/10/17 0907  . traZODone (DESYREL) tablet 200 mg  200 mg Oral QHS McNew, Holly R, MD   200 mg at 08/09/17 2102  . zolpidem (AMBIEN) tablet 10 mg  10 mg Oral QHS McNew, Tyson Babinski, MD   10 mg at 08/09/17 2102    Lab Results: No results found for this or any previous visit (from the past 48 hour(s)).  Blood Alcohol level:  Lab Results  Component Value Date   ETH <10 08/06/2017   ETH <10 82/50/5397    Metabolic Disorder Labs: Lab Results  Component Value Date   HGBA1C 5.2 04/25/2017   MPG 102.54 04/25/2017   MPG 100 12/25/2016   No results found for: PROLACTIN Lab Results  Component Value Date   CHOL 263 (H) 04/25/2017   TRIG 396 (H) 04/25/2017   HDL 31 (L) 04/25/2017   CHOLHDL 8.5 04/25/2017   VLDL 79 (H) 04/25/2017   LDLCALC 153 (H) 04/25/2017   LDLCALC 133 (H) 12/25/2016    Physical Findings: AIMS: Facial and Oral Movements Muscles of Facial Expression: None, normal Lips and Perioral Area: None, normal Jaw: None, normal Tongue: None, normal,Extremity Movements Upper (arms, wrists, hands, fingers): None, normal Lower (legs, knees, ankles, toes): None, normal, Trunk Movements Neck, shoulders, hips: None, normal, Overall Severity Severity of abnormal movements (highest score from questions above): None, normal Incapacitation due to abnormal movements: None, normal Patient's awareness of abnormal movements (rate only patient's report): No Awareness, Dental Status Current problems with teeth and/or dentures?: No Does patient usually wear dentures?: No  CIWA:  CIWA-Ar Total: 2 COWS:  COWS Total Score: 3  Musculoskeletal: Strength & Muscle Tone: within normal limits Gait & Station: normal Patient leans: N/A  Psychiatric Specialty Exam: Physical Exam  Nursing note and vitals reviewed.   Review of Systems  All other systems reviewed and are negative.   Blood pressure 130/83, pulse 78, temperature 97.7 F (36.5 C), temperature source Oral,  resp. rate 18, height 6' 1"  (1.854 m), weight (!) 138.8 kg (306 lb), SpO2 97 %.Body mass index is 40.37 kg/m.  General Appearance: Casual  Eye Contact:  Good  Speech:  Clear and Coherent  Volume:  Normal  Mood:  Euthymic  Affect:  Appropriate  Thought Process:  Coherent and Goal Directed  Orientation:  Full (Time, Place, and Person)  Thought Content:  Logical  Suicidal Thoughts:  No  Homicidal Thoughts:  No  Memory:  Immediate;   Fair  Judgement:  Fair  Insight:  Fair  Psychomotor Activity:  Normal  Concentration:  Concentration: Fair  Recall:  [place] of Knowledge:  Fair  Language:  Fair  Akathisia:  No      Assets:  Communication Skills Desire for Improvement Housing Resilience  ADL's:  Intact  Cognition:  WNL  Sleep:  Number of Hours: 6.45     Treatment Plan Summary: 27 yo male admitted due to worsening mood, SI and AH. Symptoms are improving significantly He has bene observed socializing well with peers. SI and HI are resolving.   Plan:  Schizoaffective disorder -Last Abilify injection was 400 mg on 12/28 -Continue LExapro 10 mg daily -Continue Depakote 500 mg BID  Insomnia -Trazodone 255m qhs -Ambien 10 mg qhs  Pain/Anxiety Gabapentin 1200 mg TID  Tooth Pain -one time dose of oxycodone -prn tramadol - increase to 1031morn. He will need to follow up with dentist on discharge  Dispo -He will return home on discharge and follow up with [facility]   LaJolene SchimkeMD 08/10/2017, 1:10 PM

## 2017-08-10 NOTE — [facility] Group Notes (Signed)
LCSW Group Therapy Note  08/10/2017 1:15pm  Type of Therapy and Topic:  Group Therapy:  Cognitive Distortions  Participation Level:  Active   Description of Group:    Patients in this group will be introduced to the topic of cognitive distortions.  Patients will identify and describe cognitive distortions, describe the feelings these distortions create for them.  Patients will identify one or more situations in their personal life where they have cognitively distorted thinking and will verbalize challenging this cognitive distortion through positive thinking skills.  Patients will practice the skill of using positive affirmations to challenge cognitive distortions using affirmation cards.    Therapeutic Goals:  1. Patient will identify two or more cognitive distortions they have used 2. Patient will identify one or more emotions that stem from use of a cognitive distortion 3. Patient will demonstrate use of a positive affirmation to counter a cognitive distortion through discussion and/or role play. 4. Patient will describe one way cognitive distortions can be detrimental to wellness   Summary of Patient Progress: Pt actively participated in group discussion. Pt was able to identify cognitive distortions he has used in the past. Pt shared some of his unhelpful thinking styles that are affecting him the most such as jumping to conclusions and personalization. Pt used positive affirmations to counter a cognitive distortion through discussion.     Therapeutic Modalities:   Cognitive Behavioral Therapy Motivational Interviewing   Estevan Kersh  CUEBAS-COLON, LCSW 08/10/2017 11:00 AM

## 2017-08-11 MED ORDER — OXYCODONE-ACETAMINOPHEN 5-325 MG PO TABS
1.0000 | ORAL_TABLET | Freq: Every day | ORAL | Status: DC | PRN
  Administered 2017-08-12 – 2017-08-13 (×3): 1 via ORAL
  Filled 2017-08-11 (×3): qty 1

## 2017-08-11 NOTE — B[facility] Group Notes (Signed)
[place] Group Notes:  (Nursing/MHT/Case Management/Adjunct)  Date:  08/11/2017  Time:  5:35 AM  Type of Therapy:  Psychoeducational Skills  Participation Level:  Active  Participation Quality:  Appropriate and Attentive  Affect:  Appropriate  Cognitive:  Appropriate  Insight:  Appropriate and Good  Engagement in Group:  Engaged  Modes of Intervention:  Discussion, Socialization and Support  Summary of Progress/Problems:  Carl Martinez 08/11/2017, 5:35 AM

## 2017-08-11 NOTE — Progress Notes (Signed)
[facility] MD Progress Note  08/11/2017 6:58 PM Drevin RAAD CLAYSON  MRN:  [id number] Subjective:   Carl Martinez is sitting up in his room.  He states the percocet took his pain down to 3/10.  He has been alternating percocet and tramadol.  He didn't sleep well last night (however the unit was disruptive).  He states he has swelling in his legs from depakote "it always happens" he isn't bothered, +1 pitting edema bilaterally. Overall stats his moos is good. Denies si/hi/avh   Principal Problem: Schizoaffective disorder, bipolar type ([place]) Diagnosis:   Patient Active Problem List   Diagnosis Date Noted  . Non-compliance with treatment [Z91.19] 08/07/2017  . Schizoaffective disorder, bipolar type ([place]) [F25.0] 04/24/2017  . MDD (major depressive disorder), recurrent episode, moderate ([place]) [F33.1] 12/26/2016  . Chronic back pain [M54.9, G89.29] 12/25/2016  . PTSD (post-traumatic stress disorder) [F43.10] 12/18/2016  . GERD (gastroesophageal reflux disease) [K21.9] 12/18/2016  . Crohn's disease ([place]) [K50.90] 12/12/2016  . Tobacco use disorder [F17.200] 04/05/2016   Total Time spent with patient: 20 minutes  Past Psychiatric History: See H&P  Past Medical History:  Past Medical History:  Diagnosis Date  . Anxiety   . Bipolar 1 disorder ([place])   . Crohn disease ([place])   . Depression   . Renal disorder     Past Surgical History:  Procedure Laterality Date  . CHOLECYSTECTOMY     Family History: History reviewed. No pertinent family history. Family Psychiatric  History: See H&P Social History:  Social History   Substance and Sexual Activity  Alcohol Use No     Social History   Substance and Sexual Activity  Drug Use No    Social History   Socioeconomic History  . Marital status: Legally Separated    Spouse name: None  . Number of children: None  . Years of education: None  . Highest education level: None  Social Needs  . Financial resource strain: None  . Food insecurity -  worry: None  . Food insecurity - inability: None  . Transportation needs - medical: None  . Transportation needs - non-medical: None  Occupational History  . None  Tobacco Use  . Smoking status: Current Every Day Smoker    Packs/day: 1.50    Years: 10.00    Pack years: 15.00    Types: Cigarettes  . Smokeless tobacco: Never Used  Substance and Sexual Activity  . Alcohol use: No  . Drug use: No  . Sexual activity: Yes    Birth control/protection: None  Other Topics Concern  . None  Social History Narrative  . None   Additional Social History:    Pain Medications: See MAR Prescriptions: See MAR Over the Counter: See MAR History of alcohol / drug use?: No history of alcohol / drug abuse                    Sleep: Good  Appetite:  Good  Current Medications: Current Facility-Administered Medications  Medication Dose Route Frequency Provider Last Rate Last Dose  . acetaminophen (TYLENOL) tablet 650 mg  650 mg Oral Q6H PRN Pucilowska, Jolanta B, MD   650 mg at 08/10/17 2107  . alum & mag hydroxide-simeth (MAALOX/MYLANTA) 200-200-20 MG/5ML suspension 30 mL  30 mL Oral Q4H PRN Pucilowska, Jolanta B, MD      . Derrill Memo ON 08/23/2017] ARIPiprazole ER SRER 400 mg  400 mg Intramuscular Once McNew, Tyson Babinski, MD      . divalproex (DEPAKOTE) DR tablet  500 mg  500 mg Oral Q12H McNew, Tyson Babinski, MD   500 mg at 08/11/17 0829  . escitalopram (LEXAPRO) tablet 10 mg  10 mg Oral Daily McNew, Tyson Babinski, MD   10 mg at 08/11/17 0829  . gabapentin (NEURONTIN) tablet 1,200 mg  1,200 mg Oral TID Marylin Crosby, MD   1,200 mg at 08/11/17 1656  . hydrOXYzine (ATARAX/VISTARIL) tablet 50 mg  50 mg Oral Q4H PRN Marylin Crosby, MD   50 mg at 08/11/17 1212  . magnesium hydroxide (MILK OF MAGNESIA) suspension 30 mL  30 mL Oral Daily PRN Pucilowska, Jolanta B, MD      . nicotine (NICODERM CQ - dosed in mg/24 hours) patch 21 mg  21 mg Transdermal Q0600 Pucilowska, Jolanta B, MD   21 mg at 08/11/17 0831  .  [START ON 08/12/2017] oxyCODONE-acetaminophen (PERCOCET/ROXICET) 5-325 MG per tablet 1 tablet  1 tablet Oral Daily PRN Jolene Schimke, MD      . pantoprazole (PROTONIX) EC tablet 40 mg  40 mg Oral Daily McNew, Tyson Babinski, MD   40 mg at 08/11/17 0829  . traMADol (ULTRAM) tablet 100 mg  100 mg Oral BID PRN Jolene Schimke, MD   100 mg at 08/11/17 1211  . traZODone (DESYREL) tablet 200 mg  200 mg Oral QHS McNew, Holly R, MD   200 mg at 08/10/17 2107  . zolpidem (AMBIEN) tablet 10 mg  10 mg Oral QHS McNew, Tyson Babinski, MD   10 mg at 08/10/17 2107    Lab Results: No results found for this or any previous visit (from the past 48 hour(s)).  Blood Alcohol level:  Lab Results  Component Value Date   ETH <10 08/06/2017   ETH <10 29/51/8841    Metabolic Disorder Labs: Lab Results  Component Value Date   HGBA1C 5.2 04/25/2017   MPG 102.54 04/25/2017   MPG 100 12/25/2016   No results found for: PROLACTIN Lab Results  Component Value Date   CHOL 263 (H) 04/25/2017   TRIG 396 (H) 04/25/2017   HDL 31 (L) 04/25/2017   CHOLHDL 8.5 04/25/2017   VLDL 79 (H) 04/25/2017   LDLCALC 153 (H) 04/25/2017   LDLCALC 133 (H) 12/25/2016    Physical Findings: AIMS: Facial and Oral Movements Muscles of Facial Expression: None, normal Lips and Perioral Area: None, normal Jaw: None, normal Tongue: None, normal,Extremity Movements Upper (arms, wrists, hands, fingers): None, normal Lower (legs, knees, ankles, toes): None, normal, Trunk Movements Neck, shoulders, hips: None, normal, Overall Severity Severity of abnormal movements (highest score from questions above): None, normal Incapacitation due to abnormal movements: None, normal Patient's awareness of abnormal movements (rate only patient's report): No Awareness, Dental Status Current problems with teeth and/or dentures?: No Does patient usually wear dentures?: No  CIWA:  CIWA-Ar Total: 2 COWS:  COWS Total Score: 3  Musculoskeletal: Strength & Muscle  Tone: within normal limits Gait & Station: normal Patient leans: N/A  Psychiatric Specialty Exam: Physical Exam  Nursing note and vitals reviewed.  front tooth is black and ill looking  +1 pitting edema in bilateral legs  Review of Systems  All other systems reviewed and are negative.   Blood pressure 133/80, pulse 89, temperature 97.7 F (36.5 C), temperature source Oral, resp. rate 18, height 6' 1"  (1.854 m), weight (!) 138.8 kg (306 lb), SpO2 97 %.Body mass index is 40.37 kg/m.  General Appearance: Casual  Eye Contact:  Good  Speech:  Clear and Coherent  Volume:  Normal  Mood:  Euthymic  Affect:  Appropriate  Thought Process:  Coherent and Goal Directed  Orientation:  Full (Time, Place, and Person)  Thought Content:  Logical  Suicidal Thoughts:  No  Homicidal Thoughts:  No  Memory:  Immediate;   Fair  Judgement:  Fair  Insight:  Fair  Psychomotor Activity:  Normal  Concentration:  Concentration: Fair  Recall:  [employer] of Knowledge:  Fair  Language:  Fair  Akathisia:  No      Assets:  Communication Skills Desire for Improvement Housing Resilience  ADL's:  Intact  Cognition:  WNL  Sleep:  Number of Hours: 6.45     Treatment Plan Summary: 27 yo male admitted due to worsening mood, SI and AH. Symptoms are improving significantly He has been observed socializing well with peers. SI and HI are resolving.   Plan:  Schizoaffective disorder -Last Abilify injection was 400 mg on 12/28 -Continue LExapro 10 mg daily -Continue Depakote 500 mg BID. Causing mild swelling in legs  Insomnia -Trazodone 261m qhs -Ambien 10 mg qhs  Pain/Anxiety Gabapentin 1200 mg TID  Tooth Pain -oxycodone 555mbid prn.  Tramadol was not giving pt enough coverage and he really did look in pain.  Ever since starting oxycodone last night patient has been much happier. I put in for one dose tomorrow and then Dr. McWonda Oldsan decide the rest of his pain plan -tramadol [id number].  He  alternates tramadol and oxycodone  Dispo -He will return home on discharge and follow up with [facility]   LauJolene SchimkeD 08/11/2017, 6:58 PM

## 2017-08-11 NOTE — Plan of Care (Signed)
Patient has the ability to make informed decisions regarding his treatment plan and verbalizes understanding of the information that's been provided to him without any further questions/concerns at this time. Patient denies SI/HI/AVH at this time. Patient has been observed in the day room interacting well with the other members on the unit. Patient states that he didn't sleep well last night "I woke up every thirty minutes".  Patient verbalizes understanding of and has been in compliance with his medication/therapeutic regimen thus far. Patient rates his anxiety level "7/10", but can not explain to this writer what is making him anxious at this time. Patient is functioning at an adequate level and has been participating in unit groups appropriately. Patient remains safe on the unit at this time.

## 2017-08-11 NOTE — Progress Notes (Signed)
D: Patient denies SI/HI/AVH. Patient verbally contracts for safety. Patient is calm, cooperative and pleasant. Patient is seen in milieu interacting with peers. Patient has no complaints at this time.  A: Patient was assessed by this nurse. Patient was oriented to unit. Patient's safety was maintained on unit. Q x 15 minute observation checks were completed for safety. Patient care plan was reviewed. Patient was offered support and encouragement. Patient was encourage to attend groups, participate in unit activities and continue with plan of care.   R: Patient has no complaints of pain at this time. Patient is receptive to treatment and safety maintained on unit.

## 2017-08-11 NOTE — BHH Group Notes (Signed)
LCSW Group Therapy Note 08/11/2017 1:15pm  Type of Therapy and Topic: Group Therapy: Feelings Around Returning Home & Establishing a Supportive Framework and Supporting Oneself When Supports Not Available  Participation Level: Did Not Attend  Description of Group:  Patients first processed thoughts and feelings about upcoming discharge. These included fears of upcoming changes, lack of change, new living environments, judgements and expectations from others and overall stigma of mental health issues. The group then discussed the definition of a supportive framework, what that looks and feels like, and how do to discern it from an unhealthy non-supportive network. The group identified different types of supports as well as what to do when your family/friends are less than helpful or unavailable  Therapeutic Goals  1. Patient will identify one healthy supportive network that they can use at discharge. 2. Patient will identify one factor of a supportive framework and how to tell it from an unhealthy network. 3. Patient able to identify one coping skill to use when they do not have positive supports from others. 4. Patient will demonstrate ability to communicate their needs through discussion and/or role plays.  Summary of Patient Progress:    Therapeutic Modalities Cognitive Behavioral Therapy Motivational Interviewing   [place], LCSW 08/11/2017 12:20 PM

## 2017-08-11 NOTE — Progress Notes (Signed)
D- Patient alert and oriented. Patient presents in a pleasant mood on assessment with complaints of not getting enough sleep last night stating "I woke up every thirty minutes". Patient denies SI, HI, AVH at this time. Patient also denies signs/symptoms of depression, but does rate his anxiety level a "7/10", but expresses to this writer that he doesn't know what is making him anxious. Patient continues to endorse a pain level "6/10" in his teeth stating his "tooth broke off in my gumline".  A- Scheduled medications administered to patient, per MD orders. Support and encouragement provided.  Routine safety checks conducted every 15 minutes.  Patient informed to notify staff with problems or concerns.   R- No adverse drug reactions noted. Patient contracts for safety at this time. Patient compliant with medications and treatment plan. Patient receptive, calm, and cooperative. Patient interacts well with others on the unit.  Patient remains safe at this time.

## 2017-08-11 NOTE — Progress Notes (Signed)
D: Patient denies SI/HI/AVH. Patient verbally contracts for safety. Patient is anxious, cooperative and pleasant. Patient is seen in milieu interacting with peers, patient had multiple visitors this shift and expressed that it was "good to see them." Patient has multiple complaints of pain to teeth. Patient reports high anxiety and restlessness.   A: Patient was assessed by this nurse. Patient was oriented to unit. Patient's safety was maintained on unit. Q x 15 minute observation checks were completed for safety. Patient care plan was reviewed. Patient was offered support and encouragement. Patient was encourage to attend groups, participate in unit activities and continue with plan of care.   R: Patient responded well with PRN medications, and symptoms were reduced. Patient is receptive to treatment and safety maintained on unit.

## 2017-08-11 NOTE — Plan of Care (Signed)
Patient is oriented to unit. Patient's safety is maintained on unit, patient denies SI/HI/AVH. Patient is seen in milieu interacting with peers and in with visitors. Patient reported high anxiety levels and restlessness.   Progressing Self-Concept: Ability to disclose and discuss suicidal ideas will improve 08/11/2017 2131 - Progressing by Alyson Locket I, RN Activity: Interest or engagement in leisure activities will improve 08/11/2017 2131 - Progressing by Anson [place], RN Education: Knowledge of [place] Education information/materials will improve 08/11/2017 2131 - Progressing by Anson [place], RN   Not Progressing Self-Concept: Level of anxiety will decrease 08/11/2017 2131 - Not Progressing by Anson [place], RN

## 2017-08-12 MED ORDER — ESCITALOPRAM OXALATE 10 MG PO TABS: 10.0000 mg | ORAL_TABLET | Freq: Every day | ORAL | 0 refills | Status: DC

## 2017-08-12 MED ORDER — DIVALPROEX SODIUM 500 MG PO DR TAB: 500.0000 mg | DELAYED_RELEASE_TABLET | Freq: Two times a day (BID) | ORAL | 0 refills | Status: DC

## 2017-08-12 MED ORDER — ARIPIPRAZOLE ER 400 MG IM SRER: 400.0000 mg | INTRAMUSCULAR | 1 refills | Status: DC

## 2017-08-12 NOTE — BHH Group Notes (Signed)
[facility] Group Notes:  (Nursing/MHT/Case Management/Adjunct)  Date:  08/12/2017  Time:  4:14 AM  Type of Therapy:  Group Therapy  Participation Level:  Active  Participation Quality:  Appropriate  Affect:  Appropriate  Cognitive:  Appropriate  Insight:  Appropriate  Engagement in Group:  Engaged  Modes of Intervention:  Discussion  Summary of Progress/Problems:  Kandis Fantasia 08/12/2017, 4:14 AM

## 2017-08-12 NOTE — Progress Notes (Signed)
[facility] MD Progress Note  08/12/2017 2:55 PM Carl Martinez  MRN:  [id number] Subjective:  Pt states that he is doing well. He states that mood is much better. AH have resolved. HE denies SI or HI and does not feel angry or irritable. He has been social with peers on the unit. His family visited him over the weekend and went really well. When asked what has changed, he states, "I've been using my coping skills and deep breathing and also medications are helping." Affect is brighter. He is organized and goal directed. Does not appear psychotic or manic.   Principal Problem: Schizoaffective disorder, bipolar type ([place]) Diagnosis:   Patient Active Problem List   Diagnosis Date Noted  . Non-compliance with treatment [Z91.19] 08/07/2017  . Schizoaffective disorder, bipolar type ([place]) [F25.0] 04/24/2017  . MDD (major depressive disorder), recurrent episode, moderate ([place]) [F33.1] 12/26/2016  . Chronic back pain [M54.9, G89.29] 12/25/2016  . PTSD (post-traumatic stress disorder) [F43.10] 12/18/2016  . GERD (gastroesophageal reflux disease) [K21.9] 12/18/2016  . Crohn's disease ([place]) [K50.90] 12/12/2016  . Tobacco use disorder [F17.200] 04/05/2016   Total Time spent with patient: 20 minutes  Past Psychiatric History: See H&P  Past Medical History:  Past Medical History:  Diagnosis Date  . Anxiety   . Bipolar 1 disorder ([place])   . Crohn disease ([place])   . Depression   . Renal disorder     Past Surgical History:  Procedure Laterality Date  . CHOLECYSTECTOMY     Family History: History reviewed. No pertinent family history. Family Psychiatric  History: See H&P Social History:  Social History   Substance and Sexual Activity  Alcohol Use No     Social History   Substance and Sexual Activity  Drug Use No    Social History   Socioeconomic History  . Marital status: Legally Separated    Spouse name: None  . Number of children: None  . Years of education: None  . Highest  education level: None  Social Needs  . Financial resource strain: None  . Food insecurity - worry: None  . Food insecurity - inability: None  . Transportation needs - medical: None  . Transportation needs - non-medical: None  Occupational History  . None  Tobacco Use  . Smoking status: Current Every Day Smoker    Packs/day: 1.50    Years: 10.00    Pack years: 15.00    Types: Cigarettes  . Smokeless tobacco: Never Used  Substance and Sexual Activity  . Alcohol use: No  . Drug use: No  . Sexual activity: Yes    Birth control/protection: None  Other Topics Concern  . None  Social History Narrative  . None   Additional Social History:    Pain Medications: See MAR Prescriptions: See MAR Over the Counter: See MAR History of alcohol / drug use?: No history of alcohol / drug abuse                    Sleep: Good  Appetite:  Good  Current Medications: Current Facility-Administered Medications  Medication Dose Route Frequency Provider Last Rate Last Dose  . acetaminophen (TYLENOL) tablet 650 mg  650 mg Oral Q6H PRN Pucilowska, Jolanta B, MD   650 mg at 08/11/17 2119  . alum & mag hydroxide-simeth (MAALOX/MYLANTA) 200-200-20 MG/5ML suspension 30 mL  30 mL Oral Q4H PRN Pucilowska, Jolanta B, MD      . Derrill Memo ON 08/23/2017] ARIPiprazole ER SRER 400 mg  400  mg Intramuscular Once Arsenio Schnorr, Earnest Bailey R, MD      . divalproex (DEPAKOTE) DR tablet 500 mg  500 mg Oral Q12H Jacier Gladu, Tyson Babinski, MD   500 mg at 08/12/17 0839  . escitalopram (LEXAPRO) tablet 10 mg  10 mg Oral Daily Rozetta Stumpp, Tyson Babinski, MD   10 mg at 08/12/17 0839  . gabapentin (NEURONTIN) tablet 1,200 mg  1,200 mg Oral TID Marylin Crosby, MD   1,200 mg at 08/12/17 1148  . hydrOXYzine (ATARAX/VISTARIL) tablet 50 mg  50 mg Oral Q4H PRN Marylin Crosby, MD   50 mg at 08/12/17 1352  . magnesium hydroxide (MILK OF MAGNESIA) suspension 30 mL  30 mL Oral Daily PRN Pucilowska, Jolanta B, MD      . nicotine (NICODERM CQ - dosed in mg/24 hours)  patch 21 mg  21 mg Transdermal Q0600 Pucilowska, Jolanta B, MD   21 mg at 08/12/17 0839  . oxyCODONE-acetaminophen (PERCOCET/ROXICET) 5-325 MG per tablet 1 tablet  1 tablet Oral Daily PRN Jolene Schimke, MD   1 tablet at 08/12/17 0608  . pantoprazole (PROTONIX) EC tablet 40 mg  40 mg Oral Daily Kip Kautzman, Tyson Babinski, MD   40 mg at 08/12/17 0840  . traMADol (ULTRAM) tablet 100 mg  100 mg Oral BID PRN Jolene Schimke, MD   100 mg at 08/12/17 1151  . traZODone (DESYREL) tablet 200 mg  200 mg Oral QHS Ellanore Vanhook R, MD   200 mg at 08/11/17 2117  . zolpidem (AMBIEN) tablet 10 mg  10 mg Oral QHS Jya Hughston, Tyson Babinski, MD   10 mg at 08/11/17 2118    Lab Results: No results found for this or any previous visit (from the past 48 hour(s)).  Blood Alcohol level:  Lab Results  Component Value Date   ETH <10 08/06/2017   ETH <10 65/78/4696    Metabolic Disorder Labs: Lab Results  Component Value Date   HGBA1C 5.2 04/25/2017   MPG 102.54 04/25/2017   MPG 100 12/25/2016   No results found for: PROLACTIN Lab Results  Component Value Date   CHOL 263 (H) 04/25/2017   TRIG 396 (H) 04/25/2017   HDL 31 (L) 04/25/2017   CHOLHDL 8.5 04/25/2017   VLDL 79 (H) 04/25/2017   LDLCALC 153 (H) 04/25/2017   LDLCALC 133 (H) 12/25/2016    Physical Findings: AIMS: Facial and Oral Movements Muscles of Facial Expression: None, normal Lips and Perioral Area: None, normal Jaw: None, normal Tongue: None, normal,Extremity Movements Upper (arms, wrists, hands, fingers): None, normal Lower (legs, knees, ankles, toes): None, normal, Trunk Movements Neck, shoulders, hips: None, normal, Overall Severity Severity of abnormal movements (highest score from questions above): None, normal Incapacitation due to abnormal movements: None, normal Patient's awareness of abnormal movements (rate only patient's report): No Awareness, Dental Status Current problems with teeth and/or dentures?: No Does patient usually wear dentures?: No   CIWA:  CIWA-Ar Total: 2 COWS:  COWS Total Score: 3  Musculoskeletal: Strength & Muscle Tone: within normal limits Gait & Station: normal Patient leans: N/A  Psychiatric Specialty Exam: Physical Exam  Nursing note and vitals reviewed.   Review of Systems  Cardiovascular: Positive for leg swelling.    Blood pressure (!) 147/81, pulse 88, temperature 98.8 F (37.1 C), temperature source Oral, resp. rate 18, height 6' 1"  (1.854 m), weight (!) 138.8 kg (306 lb), SpO2 96 %.Body mass index is 40.37 kg/m.  General Appearance: Casual  Eye Contact:  Good  Speech:  Clear and Coherent  Volume:  Normal  Mood:  Euthymic  Affect:  Congruent  Thought Process:  Coherent and Goal Directed  Orientation:  Full (Time, Place, and Person)  Thought Content:  Logical  Suicidal Thoughts:  No  Homicidal Thoughts:  No  Memory:  Immediate;   Fair  Judgement:  Fair  Insight:  Fair  Psychomotor Activity:  Normal  Concentration:  Concentration: Fair  Recall:  [employer] of Knowledge:  Fair  Language:  Fair  Akathisia:  No      Assets:  Communication Skills Desire for Improvement Housing Resilience  ADL's:  Intact  Cognition:  WNL  Sleep:  Number of Hours: 6.45     Treatment Plan Summary: 27 yo male admitted due to [place] and [place]. Symptoms have significantly improved. He has been social in the milieu. He has not appeared psychotic or manic through hospitalization.   Plan:  Schizoaffective disorder -Last Abilify injection was on 12/28 -Continue Lexapro 10 mg daily -Continue Depakote 500 mg BID  Insomnia -Trazodone 200 mg qhs -Ambien 10 mg qhs  Pain anxiety -Gabapentin 1200 mg TID  Dispo -he will return home on discharge. Likely tomorrow. Follow up with [place], MD 08/12/2017, 2:55 PM

## 2017-08-12 NOTE — BHH Group Notes (Signed)
[facility] Group Notes:  (Nursing/MHT/Case Management/Adjunct)  Date:  08/12/2017  Time:  8:57 PM  Type of Therapy:  Group Therapy  Participation Level:  Active  Participation Quality:  Supportive  Affect:  Appropriate  Cognitive:  Alert  Insight:  Good  Engagement in Group:  Engaged  Modes of Intervention:  Support  Summary of Progress/Problems:  Carl Martinez 08/12/2017, 8:57 PM

## 2017-08-12 NOTE — Progress Notes (Signed)
Recreation Therapy Notes  Date: 01.14.2019  Time: 1:00 PM  Location: Craft Room  Behavioral response: Appropriate  Intervention Topic: Self-Esteem  Discussion/Intervention: Group content today was focused on self-esteem. Patient defined self-esteem and where it comes form. The group described reasons self-esteem is important. Individuals stated things that impact self-esteem and positive ways to improve self-esteem. The group participated in the intervention "Collage of Me" where patients were able to create a collage of positive things that makes them who they are. Clinical Observations/Feedback:  Patient came to group and stated to improve self-esteem you must improve on yourself, down falls and hygiene. Individual stated that low self-esteem can lead to depression. He participated in the intervention and was social with peers and staff during group. Patient continues to make progress towards goals. Taraneh Metheney LRT/CTRS         Haydan Wedig 08/12/2017 2:13 PM

## 2017-08-12 NOTE — [facility] Group Notes (Signed)
08/12/2017  Time: 0930   Type of Therapy and Topic:  Group Therapy:  Overcoming Obstacles   Participation Level:  Active   Description of Group:   In this group patients will be encouraged to explore what they see as obstacles to their own wellness and recovery. They will be guided to discuss their thoughts, feelings, and behaviors related to these obstacles. The group will process together ways to cope with barriers, with attention given to specific choices patients can make. Each patient will be challenged to identify changes they are motivated to make in order to overcome their obstacles. This group will be process-oriented, with patients participating in exploration of their own experiences, giving and receiving support, and processing challenge from other group members.   Therapeutic Goals: 1. Patient will identify personal and current obstacles as they relate to admission. 2. Patient will identify barriers that currently interfere with their wellness or overcoming obstacles.  3. Patient will identify feelings, thought process and behaviors related to these barriers. 4. Patient will identify two changes they are willing to make to overcome these obstacles:      Summary of Patient Progress  Pt continues to work towards their tx goals but has not yet reached them. Pt was able to appropriately participate in group discussion, and was able to offer support/validation to other group members. Pt reported his short term obstacle is, "staying on my medications." Pt reported his long term obstacle is, "my treatment plan--it's hard to talk to my doctor or my therapist when I feel like they don't give a shit and they're just going to forget everything I said by the time the next person comes in." Pt reported he will, "keep an open mind," in order to help over come these obstacles.    Therapeutic Modalities:   Cognitive Behavioral Therapy Solution Focused Therapy Motivational Interviewing Relapse  Prevention Therapy  Alden Hipp, MSW, LCSW 08/12/2017 10:22 AM

## 2017-08-12 NOTE — Plan of Care (Signed)
Patient is alert and oriented X 4. Patient denies SI, HI and AVH. Patient complains of anxiety which is being managed with vistaril. Patient is appropriate on the unit with staff and peer. Patient is compliant with mediations and contracts for safety on the unit. Patient knowledgeable about medications able to cite use. Safety checks will continue Q 15 minutes. Nurse will continue to monitor. Education: Ability to make informed decisions regarding treatment will improve 08/12/2017 0929 - Progressing by Geraldo Docker, RN   Self-Concept: Ability to disclose and discuss suicidal ideas will improve 08/12/2017 0929 - Progressing by Geraldo Docker, RN Ability to verbalize positive feelings about self will improve 08/12/2017 0929 - Progressing by Geraldo Docker, RN   Activity: Interest or engagement in leisure activities will improve 08/12/2017 0929 - Progressing by Geraldo Docker, RN Imbalance in normal sleep/wake cycle will improve 08/12/2017 0929 - Progressing by Geraldo Docker, RN Interest or engagement in leisure activities will improve 08/12/2017 0929 - Progressing by Geraldo Docker, RN

## 2017-08-12 NOTE — Tx Team (Signed)
Interdisciplinary Treatment and Diagnostic Plan Update  08/12/2017 Time of Session: [place] MRN: [id number]  Principal Diagnosis: Schizoaffective disorder, bipolar type ([place])  Secondary Diagnoses: Principal Problem:   Schizoaffective disorder, bipolar type ([place]) Active Problems:   Tobacco use disorder   PTSD (post-traumatic stress disorder)   GERD (gastroesophageal reflux disease)   Non-compliance with treatment   Current Medications:  Current Facility-Administered Medications  Medication Dose Route Frequency Provider Last Rate Last Dose  . acetaminophen (TYLENOL) tablet 650 mg  650 mg Oral Q6H PRN Pucilowska, Jolanta B, MD   650 mg at 08/11/17 2119  . alum & mag hydroxide-simeth (MAALOX/MYLANTA) 200-200-20 MG/5ML suspension 30 mL  30 mL Oral Q4H PRN Pucilowska, Jolanta B, MD      . Derrill Memo ON 08/23/2017] ARIPiprazole ER SRER 400 mg  400 mg Intramuscular Once McNew, Holly R, MD      . divalproex (DEPAKOTE) DR tablet 500 mg  500 mg Oral Q12H McNew, Tyson Babinski, MD   500 mg at 08/12/17 0839  . escitalopram (LEXAPRO) tablet 10 mg  10 mg Oral Daily McNew, Tyson Babinski, MD   10 mg at 08/12/17 0839  . gabapentin (NEURONTIN) tablet 1,200 mg  1,200 mg Oral TID Marylin Crosby, MD   1,200 mg at 08/12/17 0839  . hydrOXYzine (ATARAX/VISTARIL) tablet 50 mg  50 mg Oral Q4H PRN Marylin Crosby, MD   50 mg at 08/12/17 0842  . magnesium hydroxide (MILK OF MAGNESIA) suspension 30 mL  30 mL Oral Daily PRN Pucilowska, Jolanta B, MD      . nicotine (NICODERM CQ - dosed in mg/24 hours) patch 21 mg  21 mg Transdermal Q0600 Pucilowska, Jolanta B, MD   21 mg at 08/12/17 0839  . oxyCODONE-acetaminophen (PERCOCET/ROXICET) 5-325 MG per tablet 1 tablet  1 tablet Oral Daily PRN Jolene Schimke, MD   1 tablet at 08/12/17 0608  . pantoprazole (PROTONIX) EC tablet 40 mg  40 mg Oral Daily McNew, Tyson Babinski, MD   40 mg at 08/12/17 0840  . traMADol (ULTRAM) tablet 100 mg  100 mg Oral BID PRN Jolene Schimke, MD    100 mg at 08/11/17 2117  . traZODone (DESYREL) tablet 200 mg  200 mg Oral QHS McNew, Tyson Babinski, MD   200 mg at 08/11/17 2117  . zolpidem (AMBIEN) tablet 10 mg  10 mg Oral QHS McNew, Tyson Babinski, MD   10 mg at 08/11/17 2118   PTA Medications: Medications Prior to Admission  Medication Sig Dispense Refill Last Dose  . ARIPiprazole (ABILIFY) 10 MG tablet Take 1 tablet (10 mg total) by mouth daily. 30 tablet 1   . ARIPiprazole ER 400 MG SRER Inject 400 mg into the muscle every 28 (twenty-eight) days. 1 each 1   . cyclobenzaprine (FLEXERIL) 10 MG tablet Take 1 tablet (10 mg total) by mouth 3 (three) times daily. 90 tablet 0 04/23/2017 at Unknown time  . divalproex (DEPAKOTE) 500 MG DR tablet Take 1 tablet (500 mg total) by mouth every 12 (twelve) hours. 60 tablet 1   . fluvoxaMINE (LUVOX) 100 MG tablet Take 1 tablet (100 mg total) by mouth at bedtime. 30 tablet 1   . gabapentin (NEURONTIN) 300 MG capsule Take 2 capsules (600 mg total) by mouth 3 (three) times daily. 180 capsule 1   . hydrOXYzine (ATARAX/VISTARIL) 50 MG tablet Take 1 tablet (50 mg total) by mouth 3 (three) times daily as needed for anxiety. 90 tablet 1   .  magnesium oxide (MAG-OX) 400 MG tablet Take 400 mg by mouth daily.   04/24/2017 at Unknown time  . omeprazole (PRILOSEC) 20 MG capsule Take 1 capsule (20 mg total) by mouth daily. 30 capsule 0 04/24/2017 at Unknown time  . traZODone (DESYREL) 100 MG tablet Take 2 tablets (200 mg total) by mouth at bedtime. 60 tablet 1   . zolpidem (AMBIEN) 10 MG tablet Take 1 tablet (10 mg total) by mouth at bedtime as needed for sleep. 30 tablet 0     Patient Stressors: Financial difficulties Health problems Medication change or noncompliance Occupational concerns  Patient Strengths: Average or above average intelligence Capable of independent living Communication skills Motivation for treatment/growth Physical Health  Treatment Modalities: Medication Management, Group therapy, Case management,   1 to 1 session with clinician, Psychoeducation, Recreational therapy.   Physician Treatment Plan for Primary Diagnosis: Schizoaffective disorder, bipolar type ([place]) Long Term Goal(s): Improvement in symptoms so as ready for discharge   Short Term Goals: Ability to disclose and discuss suicidal ideas  Medication Management: Evaluate patient's response, side effects, and tolerance of medication regimen.  Therapeutic Interventions: 1 to 1 sessions, Unit Group sessions and Medication administration.  Evaluation of Outcomes: Progressing  Physician Treatment Plan for Secondary Diagnosis: Principal Problem:   Schizoaffective disorder, bipolar type ([place]) Active Problems:   Tobacco use disorder   PTSD (post-traumatic stress disorder)   GERD (gastroesophageal reflux disease)   Non-compliance with treatment  Long Term Goal(s): Improvement in symptoms so as ready for discharge   Short Term Goals: Ability to disclose and discuss suicidal ideas     Medication Management: Evaluate patient's response, side effects, and tolerance of medication regimen.  Therapeutic Interventions: 1 to 1 sessions, Unit Group sessions and Medication administration.  Evaluation of Outcomes: Progressing   RN Treatment Plan for Primary Diagnosis: Schizoaffective disorder, bipolar type ([place]) Long Term Goal(s): Knowledge of disease and therapeutic regimen to maintain health will improve  Short Term Goals: Ability to participate in decision making will improve, Ability to identify and develop effective coping behaviors will improve and Compliance with prescribed medications will improve  Medication Management: RN will administer medications as ordered by provider, will assess and evaluate patient's response and provide education to patient for prescribed medication. RN will report any adverse and/or side effects to prescribing provider.  Therapeutic Interventions: 1 on 1 counseling sessions, Psychoeducation,  Medication administration, Evaluate responses to treatment, Monitor vital signs and CBGs as ordered, Perform/monitor CIWA, COWS, AIMS and Fall Risk screenings as ordered, Perform wound care treatments as ordered.  Evaluation of Outcomes: Progressing   LCSW Treatment Plan for Primary Diagnosis: Schizoaffective disorder, bipolar type ([place]) Long Term Goal(s): Safe transition to appropriate next level of care at discharge, Engage patient in therapeutic group addressing interpersonal concerns.  Short Term Goals: Engage patient in aftercare planning with referrals and resources, Identify triggers associated with mental health/substance abuse issues and Increase skills for wellness and recovery  Therapeutic Interventions: Assess for all discharge needs, 1 to 1 time with Social worker, Explore available resources and support systems, Assess for adequacy in community support network, Educate family and significant other(s) on suicide prevention, Complete Psychosocial Assessment, Interpersonal group therapy.  Evaluation of Outcomes: Progressing   Progress in Treatment: Attending groups: No. Participating in groups: No. Taking medication as prescribed: Yes. Toleration medication: Yes. Family/Significant other contact made: Yes, individual(s) contacted:  pt's aunt. Patient understands diagnosis: Yes. Discussing patient identified problems/goals with staff: Yes. Medical problems stabilized or resolved: Yes. Denies suicidal/homicidal  ideation: Yes. Issues/concerns per patient self-inventory: No. Other: None at this time.   New problem(s) identified: No, Describe:  none at this time.   New Short Term/Long Term Goal(s): Pt reported his goal for treatment is to, "not come back to the hospital and try to find something that works. I want to get better."   Discharge Plan or Barriers: CSW will continue to assess for appropriate discharge plan. Currently the plan is for pt to discharge home and continue  treatment in the outpatient setting with [facility].   Reason for Continuation of Hospitalization: Delusions  Hallucinations Medication stabilization  Estimated Length of Stay: 1 day  Recreational Therapy: Patient Stressors: My anger and anxiety Patient Goal: Patient will engage in interactions with peers and staff in a pro-social manner x5 days.   Attendees: Patient:  08/12/2017 11:22 AM  Physician: Dr. Wonda Olds, MD 08/12/2017 11:22 AM  Nursing: West Pugh, RN 08/12/2017 11:22 AM  RN Care Manager:  08/12/2017 11:22 AM  Social Worker: Alden Hipp, LCSW 08/12/2017 11:22 AM  Recreational Therapist: Roanna Epley, CTRS-LTR 08/12/2017 11:22 AM  Other: Darin Engels, [place] 08/12/2017 11:22 AM  Other:  08/12/2017 11:22 AM  Other: 08/12/2017 11:22 AM    Scribe for Treatment Team: Alden Hipp, LCSW 08/12/2017 11:22 AM

## 2017-08-13 NOTE — Progress Notes (Signed)
Recreation Therapy Notes  INPATIENT RECREATION TR PLAN  Patient Details Name: Carl Martinez MRN: [id number] DOB: September 03, 1990 Today's Date: 08/13/2017  Rec Therapy Plan Is patient appropriate for Therapeutic Recreation?: Yes Treatment times per week: At least 3 Estimated Length of Stay: 5-7 days TR Treatment/Interventions: Group participation (Comment)  Discharge Criteria Pt will be discharged from therapy if:: Discharged Treatment plan/goals/alternatives discussed and agreed upon by:: Patient/family  Discharge Summary Short term goals set: Patient will engage in interactions with peers and staff in a pro-social manner x5 days.  Short term goals met: Complete Progress toward goals comments: Groups attended Which groups?: Stress management, Leisure education, Self-esteem Reason goals not met: N/A Therapeutic equipment acquired: N/A Reason patient discharged from therapy: Discharge from hospital Pt/family agrees with progress & goals achieved: Yes Date patient discharged from therapy: 08/13/17   Harper Vandervoort 08/13/2017, 3:29 PM

## 2017-08-13 NOTE — [facility] Group Notes (Signed)
08/13/2017 9:30AM  Type of Therapy/Topic:  Group Therapy:  Feelings about Diagnosis  Participation Level:  Did Not Attend   Description of Group:   This group will allow patients to explore their thoughts and feelings about diagnoses they have received. Patients will be guided to explore their level of understanding and acceptance of these diagnoses. Facilitator will encourage patients to process their thoughts and feelings about the reactions of others to their diagnosis and will guide patients in identifying ways to discuss their diagnosis with significant others in their lives. This group will be process-oriented, with patients participating in exploration of their own experiences, giving and receiving support, and processing challenge from other group members.   Therapeutic Goals: 1. Patient will demonstrate understanding of diagnosis as evidenced by identifying two or more symptoms of the disorder 2. Patient will be able to express two feelings regarding the diagnosis 3. Patient will demonstrate their ability to communicate their needs through discussion and/or role play  Summary of Patient Progress: Patient was encouraged and invited to attend group. Patient did not attend group. Social worker will continue to encourage group participation in the future.       Therapeutic Modalities:   Cognitive Behavioral Therapy Brief Therapy Feelings Identification    Darin Engels, [place] 08/13/2017 10:23 AM

## 2017-08-13 NOTE — Plan of Care (Signed)
Patient slept for Estimated Hours of 7; Precautionary checks every 15 minutes for safety maintained, room free of safety hazards, patient sustains no injury or falls during this shift.

## 2017-08-13 NOTE — Progress Notes (Signed)
  [facility] Adult Case Management Discharge Plan :  Will you be returning to the same living situation after discharge:  Yes,  aunt's home. At discharge, do you have transportation home?: Yes,  aunt. Do you have the ability to pay for your medications: Yes,  [facility].  Release of information consent forms completed and in the chart;  Patient's signature needed at discharge.  Patient to Follow up at: Follow-up Information    [facility] Follow up on 08/16/2017.   Why:  Please go to your hospital follow up on Friday, 08/16/17 at 12:30PM. Thank you! Contact information: [place] [address] [phone]           Next level of care provider has access to [place] and Suicide Prevention discussed: Yes,  with pt and his aunt.  Have you used any form of tobacco in the last 30 days? (Cigarettes, Smokeless Tobacco, Cigars, and/or Pipes): No  Has patient been referred to the Quitline?: N/A patient is not a smoker  Patient has been referred for addiction treatment: N/A  Alden Hipp, LCSW 08/13/2017, 9:09 AM

## 2017-08-13 NOTE — [facility] Suicide Risk Assessment (Signed)
[facility] Discharge Suicide Risk Assessment   Principal Problem: Schizoaffective disorder, bipolar type [facility]) Discharge Diagnoses:  Patient Active Problem List   Diagnosis Date Noted  . Non-compliance with treatment [Z91.19] 08/07/2017  . Schizoaffective disorder, bipolar type ([place]) [F25.0] 04/24/2017  . MDD (major depressive disorder), recurrent episode, moderate ([place]) [F33.1] 12/26/2016  . Chronic back pain [M54.9, G89.29] 12/25/2016  . PTSD (post-traumatic stress disorder) [F43.10] 12/18/2016  . GERD (gastroesophageal reflux disease) [K21.9] 12/18/2016  . Crohn's disease ([place]) [K50.90] 12/12/2016  . Tobacco use disorder [F17.200] 04/05/2016    Total Time spent with patient: 15 minutes    Mental Status Per Nursing Assessment::   On Admission:     Demographic Factors:  Male, Caucasian and Unemployed  Loss Factors: NA  Historical Factors: NA  Risk Reduction Factors:   Responsible for children under 72 years of age, Sense of responsibility to family, Living with another person, especially a relative, Positive social support, Positive therapeutic relationship and Positive coping skills or problem solving skills  Continued Clinical Symptoms:  None  Cognitive Features That Contribute To Risk:  None    Suicide Risk:  Minimal: No identifiable suicidal ideation.  Patients presenting with no risk factors but with morbid ruminations; may be classified as minimal risk based on the severity of the depressive symptoms  Follow-up Information    [facility] Follow up on 08/16/2017.   Why:  Please go to your hospital follow up on Friday, 08/16/17 at 12:30PM. Thank you! Contact information: [place] [address] [phone]           Plan Of Care/Follow-up recommendations: Follow up with [facility], Next Abilify injection due on 08/23/2017  Marylin Crosby, MD 08/13/2017, 10:04 AM

## 2017-08-13 NOTE — Progress Notes (Signed)
D: Patient is aware of  Discharge this shift .Patient denies suicidal /homicidal ideations. Patient received all belongings brought in  A: No Storage medications. Writer reviewed Discharge Summary, Suicide Risk Assessment, and Transitional Record. Patient also received Prescriptions   from  MD. . Aware  Of follow up appointment . R: Patient left unit with no questions  Or concerns  With family member.

## 2017-08-13 NOTE — Progress Notes (Signed)
Patient ID: Carl Martinez, male   DOB: 1991-04-02, 27 y.o.   MRN: [id number] A&Ox3, toothache 7/10, received Tramadol 100 mg given for pain; c/o anxiety and requested for Vistaril 50 mg with the rest of his medication at bedtime; pleasant, attended group, "I am leaving tomorrow, going home.Marland KitchenMarland Kitchen"

## 2017-08-13 NOTE — Discharge Summary (Signed)
Physician Discharge Summary Note  Patient:  Carl Martinez is an 27 y.o., male MRN:  [id number] DOB:  [birth year] Patient phone:  [phone] (home)  Patient address:   [phone] [place] [address],  Total Time spent with patient: 15 minutes  Plus 20 minutes of medication reconciliation, discharge planning, and discharge documentation   Date of Admission:  08/07/2017 Date of Discharge: 08/13/17  Reason for Admission:  27 yo male presented to ED reporting AH and SI. Pt states that he has been feeling very depressed for the past 2 week and "has been getting worse." He stats that he was starting to have suicidal thoughts and thinking about hanging himself from a tree. He told his aunt about this and she called 36. He is unable to identify any recent stressors that may have triggered this. He states that he was feeling fine prior to that. He states that "sleep is not good." HE wakes up many times a night. He reports feeling hopeless at times. HE states that he does not think that the Depakote or Luvox are helping. He is on Abilify Maintenance and last had an injection on 12/28. He feels that this medication has been helpful for AH. However, he states that he has been hearing AH of "screaming' He initially states that he cannot make out what they are saying at all and it is a single male voice. He later contradicts himself and states, "they do tell me to kill myself." He is able to tell that these are not real and has never acted on them. He states that he was having homicidal thoughts yesterday but not today. He denies any desire to hurt others. HE denies history of violence or harming others and denies history of suicide attempts. HE sees Dr. Leonides Schanz at [facility]. He states that he would like to change the Luvox to another medication. Pt is very organized and goal directed in conversation and does not appear psychotic or manic.             Per chart review, pt has been strongly felt to be malingering due  to stressful living situation. He has history of consistently reporting AH but never observed responding to internal stimuli and has never been evidence of having a primary psychotic disorder. MMPI was done in May 2018 and was consistent with malingering.    Principal Problem: Schizoaffective disorder, bipolar type [facility]) Discharge Diagnoses: Patient Active Problem List   Diagnosis Date Noted  . Non-compliance with treatment [Z91.19] 08/07/2017  . Schizoaffective disorder, bipolar type ([place]) [F25.0] 04/24/2017  . MDD (major depressive disorder), recurrent episode, moderate ([place]) [F33.1] 12/26/2016  . Chronic back pain [M54.9, G89.29] 12/25/2016  . PTSD (post-traumatic stress disorder) [F43.10] 12/18/2016  . GERD (gastroesophageal reflux disease) [K21.9] 12/18/2016  . Crohn's disease ([place]) [K50.90] 12/12/2016  . Tobacco use disorder [F17.200] 04/05/2016    Past Psychiatric History: See H&P  Past Medical History:  Past Medical History:  Diagnosis Date  . Anxiety   . Bipolar 1 disorder ([place])   . Crohn disease ([place])   . Depression   . Renal disorder     Past Surgical History:  Procedure Laterality Date  . CHOLECYSTECTOMY     Family History: History reviewed. No pertinent family history. Family Psychiatric  History: See H&P Social History:  Social History   Substance and Sexual Activity  Alcohol Use No     Social History   Substance and Sexual Activity  Drug Use No  Social History   Socioeconomic History  . Marital status: Legally Separated    Spouse name: None  . Number of children: None  . Years of education: None  . Highest education level: None  Social Needs  . Financial resource strain: None  . Food insecurity - worry: None  . Food insecurity - inability: None  . Transportation needs - medical: None  . Transportation needs - non-medical: None  Occupational History  . None  Tobacco Use  . Smoking status: Current Every Day Smoker    Packs/day: 1.50     Years: 10.00    Pack years: 15.00    Types: Cigarettes  . Smokeless tobacco: Never Used  Substance and Sexual Activity  . Alcohol use: No  . Drug use: No  . Sexual activity: Yes    Birth control/protection: None  Other Topics Concern  . None  Social History Narrative  . None    Hospital Course:  Pt was switched from Luvox to Zoloft to address symptoms of depression. He recently received Abilify Maintenna injection on 12/28 and this was confirmed with [facility]. He was restarted on Depakote which he was likely noncompliant with due to low Depakote level. This was started to help address anger and irritability.y Pt was very social on the milieu and attended groups. HE was very calm and pleasant during hospitalization and no signs of irritability or anger. Pt did have some elements of malingering. HE initially endorsed voices "Screaming" however never appeared to be responding to internal stimuli, no delay in responses to questions, was very organized and gaol directed in conversation. The voices also improved within a day or two on the unit with no adjustments to anti-psychotics. He also endorsed HI (towards no one in particular, and no plan or intent) initially during hospitalization but prior to him endorsing this he was very social with peers and laughing in the milieu. His household is chaotic at times and appears he needed some respite away for a few days and this could have been a reason to endorse these symptoms. On day of discharge, he reported feeling much better. His mood was improved. He denied feeling angry or aggressive. HE denied SI or any thoughts of self harm. Denied HI. AH improved and no longer hearing any voices. Affect was bright. HE states that he is excited to go home and spend time with his children. HE voices that his next Abilify injection is due ton 1/25 and he knows he has an upcoming appointment with [facility].   The patient is at low risk of imminent suicide. Patient denied thoughts,  intent, or plan for harm to self or others, expressed significant future orientation, and expressed an ability to mobilize assistance for his needs. he is presently void of any contributing psychiatric symptoms, cognitive difficulties, or substance use which would elevate his risk for lethality. Chronic risk for lethality is elevated in light of male gender, poor coping skills. The chronic risk is presently mitigated by his ongoing desire and engagement in [facility] treatment and mobilization of support from family and friends. Chronic risk may elevate if he experiences any significant loss or worsening of symptoms, which can be managed and monitored through outpatient providers. At this time,a cute risk for lethality is low and he is stable for ongoing outpatient management.   Modifiable risk factors were addressed during this hospitalization through appropriate pharmacotherapy and establishment of outpatient follow-up treatment. Some risk factors for suicide are situational (i.e. Unstable housing) or related personality pathology (  i.e. Poor coping mechanisms) and thus cannot be further mitigated by continued hospitalization in this setting.    Physical Findings: AIMS: Facial and Oral Movements Muscles of Facial Expression: None, normal Lips and Perioral Area: None, normal Jaw: None, normal Tongue: None, normal,Extremity Movements Upper (arms, wrists, hands, fingers): None, normal Lower (legs, knees, ankles, toes): None, normal, Trunk Movements Neck, shoulders, hips: None, normal, Overall Severity Severity of abnormal movements (highest score from questions above): None, normal Incapacitation due to abnormal movements: None, normal Patient's awareness of abnormal movements (rate only patient's report): No Awareness, Dental Status Current problems with teeth and/or dentures?: No Does patient usually wear dentures?: No  CIWA:  CIWA-Ar Total: 2 COWS:  COWS Total Score: 3  Musculoskeletal: Strength &  Muscle Tone: within normal limits Gait & Station: normal Patient leans: N/A  Psychiatric Specialty Exam: Physical Exam  ROS  Blood pressure (!) 148/94, pulse 86, temperature (!) 97.4 F (36.3 C), temperature source Oral, resp. rate 18, height 6' 1"  (1.854 m), weight (!) 138.8 kg (306 lb), SpO2 98 %.Body mass index is 40.37 kg/m.  General Appearance: Casual  Eye Contact:  Good  Speech:  Clear and Coherent  Volume:  Normal  Mood:  Euthymic  Affect:  Constricted  Thought Process:  Coherent and Goal Directed  Orientation:  Full (Time, Place, and Person)  Thought Content:  Logical  Suicidal Thoughts:  No  Homicidal Thoughts:  No  Memory:  Immediate;   Fair  Judgement:  Fair  Insight:  Fair  Psychomotor Activity:  Normal  Concentration:  Concentration: Fair  Recall:  [place] of Knowledge:  Fair  Language:  Fair  Akathisia:  No      Assets:  Communication Skills Desire for Improvement Housing Resilience Social Support  ADL's:  Intact  Cognition:  WNL  Sleep:  Number of Hours: 7     Have you used any form of tobacco in the last 30 days? (Cigarettes, Smokeless Tobacco, Cigars, and/or Pipes): No  Has this patient used any form of tobacco in the last 30 days? (Cigarettes, Smokeless Tobacco, Cigars, and/or Pipes)  No  Blood Alcohol level:  Lab Results  Component Value Date   ETH <10 08/06/2017   ETH <10 09/47/0962    Metabolic Disorder Labs:  Lab Results  Component Value Date   HGBA1C 5.2 04/25/2017   MPG 102.54 04/25/2017   MPG 100 12/25/2016   No results found for: PROLACTIN Lab Results  Component Value Date   CHOL 263 (H) 04/25/2017   TRIG 396 (H) 04/25/2017   HDL 31 (L) 04/25/2017   CHOLHDL 8.5 04/25/2017   VLDL 79 (H) 04/25/2017   LDLCALC 153 (H) 04/25/2017   LDLCALC 133 (H) 12/25/2016    See Psychiatric Specialty Exam and Suicide Risk Assessment completed by Attending Physician prior to discharge.  Discharge destination:  Home  Is patient on  multiple antipsychotic therapies at discharge:  No   Has Patient had three or more failed trials of antipsychotic monotherapy by history:  No  Recommended Plan for Multiple Antipsychotic Therapies: NA  Discharge Instructions    Increase activity slowly   Complete by:  As directed      Allergies as of 08/13/2017      Reactions   Nsaids Swelling   Chrons- swell up on inside per pt      Medication List    STOP taking these medications   fluvoxaMINE 100 MG tablet Commonly known as:  LUVOX  TAKE these medications     Indication  ARIPiprazole ER 400 MG Srer Inject 400 mg into the muscle every 28 (twenty-eight) days. Next dose due 08/23/17 What changed:    additional instructions  Another medication with the same name was removed. Continue taking this medication, and follow the directions you see here.  Indication:  Mixed Bipolar Affective Disorder   cyclobenzaprine 10 MG tablet Commonly known as:  FLEXERIL Take 1 tablet (10 mg total) by mouth 3 (three) times daily.  Indication:  Muscle Spasm   divalproex 500 MG DR tablet Commonly known as:  DEPAKOTE Take 1 tablet (500 mg total) by mouth every 12 (twelve) hours.  Indication:  Schizophrenia, irritability   escitalopram 10 MG tablet Commonly known as:  LEXAPRO Take 1 tablet (10 mg total) by mouth daily.  Indication:  Major Depressive Disorder   gabapentin 300 MG capsule Commonly known as:  NEURONTIN Take 2 capsules (600 mg total) by mouth 3 (three) times daily.  Indication:  Neuropathic Pain   hydrOXYzine 50 MG tablet Commonly known as:  ATARAX/VISTARIL Take 1 tablet (50 mg total) by mouth 3 (three) times daily as needed for anxiety.  Indication:  Feeling Anxious   magnesium oxide 400 MG tablet Commonly known as:  MAG-OX Take 400 mg by mouth daily.  Indication:  Disorder with Low Magnesium Levels   omeprazole 20 MG capsule Commonly known as:  PRILOSEC Take 1 capsule (20 mg total) by mouth daily.   Indication:  Gastroesophageal Reflux Disease   traZODone 100 MG tablet Commonly known as:  DESYREL Take 2 tablets (200 mg total) by mouth at bedtime.  Indication:  Trouble Sleeping   zolpidem 10 MG tablet Commonly known as:  AMBIEN Take 1 tablet (10 mg total) by mouth at bedtime as needed for sleep.  Indication:  [place] Follow up on 08/16/2017.   Why:  Please go to your hospital follow up on Friday, 08/16/17 at 12:30PM. Thank you! Contact information: [place] [address] [phone]           Follow-up recommendations: Follow up with [facility] Signed: Marylin Crosby, MD 08/13/2017, 10:06 AM

## 2017-10-07 ENCOUNTER — Emergency Department: Payer: Medicaid Other

## 2017-10-07 ENCOUNTER — Other Ambulatory Visit: Payer: Self-pay

## 2017-10-07 ENCOUNTER — Emergency Department
Admission: EM | Admit: 2017-10-07 | Discharge: 2017-10-07 | Disposition: A | Discharge status: Home / Self Care | Payer: Medicaid Other | Attending: Emergency Medicine | Admitting: Emergency Medicine

## 2017-10-07 DIAGNOSIS — R109 Unspecified abdominal pain: Secondary | ICD-10-CM

## 2017-10-07 DIAGNOSIS — K529 Noninfective gastroenteritis and colitis, unspecified: Secondary | ICD-10-CM | POA: Diagnosis not present

## 2017-10-07 DIAGNOSIS — R1032 Left lower quadrant pain: Secondary | ICD-10-CM | POA: Diagnosis present

## 2017-10-07 DIAGNOSIS — E860 Dehydration: Secondary | ICD-10-CM | POA: Insufficient documentation

## 2017-10-07 DIAGNOSIS — F17210 Nicotine dependence, cigarettes, uncomplicated: Secondary | ICD-10-CM | POA: Diagnosis not present

## 2017-10-07 DIAGNOSIS — Z79899 Other long term (current) drug therapy: Secondary | ICD-10-CM | POA: Diagnosis not present

## 2017-10-07 LAB — CBC
HCT: 48.8 % (ref 40.0–52.0)
HEMOGLOBIN: 17 g/dL (ref 13.0–18.0)
MCH: 32 pg (ref 26.0–34.0)
MCHC: 34.8 g/dL (ref 32.0–36.0)
MCV: 92 fL (ref 80.0–100.0)
Platelets: 214 10*3/uL (ref 150–440)
RBC: 5.3 MIL/uL (ref 4.40–5.90)
RDW: 13.3 % (ref 11.5–14.5)
WBC: 12.3 10*3/uL — ABNORMAL HIGH (ref 3.8–10.6)

## 2017-10-07 LAB — COMPREHENSIVE METABOLIC PANEL
ALBUMIN: 4.5 g/dL (ref 3.5–5.0)
ALK PHOS: 78 U/L (ref 38–126)
ALT: 108 U/L — AB (ref 17–63)
ANION GAP: 11 (ref 5–15)
AST: 59 U/L — ABNORMAL HIGH (ref 15–41)
BUN: 11 mg/dL (ref 6–20)
CALCIUM: 9.5 mg/dL (ref 8.9–10.3)
CHLORIDE: 103 mmol/L (ref 101–111)
CO2: 22 mmol/L (ref 22–32)
Creatinine, Ser: 1.3 mg/dL — ABNORMAL HIGH (ref 0.61–1.24)
GFR calc non Af Amer: >60 mL/min (ref >60)
GLUCOSE: 107 mg/dL — AB (ref 65–99)
Potassium: 3.6 mmol/L (ref 3.5–5.1)
Sodium: 136 mmol/L (ref 135–145)
Total Bilirubin: 0.5 mg/dL (ref 0.3–1.2)
Total Protein: 7.6 g/dL (ref 6.5–8.1)

## 2017-10-07 MED ORDER — ONDANSETRON 4 MG PO TBDP
4.0000 mg | ORAL_TABLET | Freq: Once | ORAL | Status: AC
  Administered 2017-10-07: 4 mg via ORAL

## 2017-10-07 MED ORDER — ONDANSETRON HCL 4 MG PO TABS: 4.0000 mg | ORAL_TABLET | Freq: Every day | ORAL | 0 refills | Status: DC | PRN

## 2017-10-07 MED ORDER — OXYCODONE-ACETAMINOPHEN 5-325 MG PO TABS
1.0000 | ORAL_TABLET | Freq: Once | ORAL | Status: AC
  Administered 2017-10-07: 1 via ORAL
  Filled 2017-10-07: qty 1

## 2017-10-07 MED ORDER — ONDANSETRON 4 MG PO TBDP
ORAL_TABLET | ORAL | Status: AC
  Administered 2017-10-07: 4 mg via ORAL
  Filled 2017-10-07: qty 1

## 2017-10-07 MED ORDER — SODIUM CHLORIDE 0.9 % IV BOLUS (SEPSIS)
1000.0000 mL | Freq: Once | INTRAVENOUS | Status: AC
Start: 2017-10-07 — End: 2017-10-07
  Administered 2017-10-07: 1000 mL via INTRAVENOUS

## 2017-10-07 NOTE — ED Provider Notes (Signed)
[facility] Emergency Department Provider Note  ____________________________________________   I have reviewed the triage vital signs and the nursing notes. Where available I have reviewed prior notes and, if possible and indicated, outside hospital notes.    HISTORY  Chief Complaint GI Bleeding    HPI Carl Martinez is a 27 y.o. male with a history of what appears to be hemorrhoidal bleeds in the past, presents today complaining of nausea vomiting diarrhea left lower quadrant abdominal pain and dark stool.  He states the dark stool actually only happened after he took Pepto-Bismol.  He states that he has had no fever or chills.  Has only vomited a few times today.  Initially he stated that he did not want to stay for any further testing but he has since decided to stay.  He does have a history of leaving AMA from other admissions.  He has no SI or HI does have an extensive psychiatric history but he does not have any psychiatric complaint today.  Patient states he has left lower quadrant abdominal pain is crampy worse when he has to have a bowel movement minutes but not completely relieved with bowel movements.  He has had the symptoms for the last couple days.  Past Medical History:  Diagnosis Date  . Anxiety   . Bipolar 1 disorder ([place])   . Crohn disease ([place])   . Depression   . Renal disorder    kidney stones    Patient Active Problem List   Diagnosis Date Noted  . Non-compliance with treatment 08/07/2017  . Schizoaffective disorder, bipolar type ([place]) 04/24/2017  . MDD (major depressive disorder), recurrent episode, moderate ([place]) 12/26/2016  . Chronic back pain 12/25/2016  . PTSD (post-traumatic stress disorder) 12/18/2016  . GERD (gastroesophageal reflux disease) 12/18/2016  . Crohn's disease ([place]) 12/12/2016  . Tobacco use disorder 04/05/2016    Past Surgical History:  Procedure Laterality Date  . CHOLECYSTECTOMY      Prior to  Admission medications   Medication Sig Start Date End Date Taking? Authorizing Provider  ARIPiprazole ER 400 MG SRER Inject 400 mg into the muscle every 28 (twenty-eight) days. Next dose due 08/23/17 08/12/17   McNew, Tyson Babinski, MD  cyclobenzaprine (FLEXERIL) 10 MG tablet Take 1 tablet (10 mg total) by mouth 3 (three) times daily. 12/18/16   Pucilowska, Herma Ard B, MD  divalproex (DEPAKOTE) 500 MG DR tablet Take 1 tablet (500 mg total) by mouth every 12 (twelve) hours. 08/12/17   McNew, Tyson Babinski, MD  escitalopram (LEXAPRO) 10 MG tablet Take 1 tablet (10 mg total) by mouth daily. 08/13/17   McNew, Tyson Babinski, MD  gabapentin (NEURONTIN) 300 MG capsule Take 2 capsules (600 mg total) by mouth 3 (three) times daily. 04/29/17   Pucilowska, Herma Ard B, MD  hydrOXYzine (ATARAX/VISTARIL) 50 MG tablet Take 1 tablet (50 mg total) by mouth 3 (three) times daily as needed for anxiety. 04/29/17   Pucilowska, Jolanta B, MD  magnesium oxide (MAG-OX) 400 MG tablet Take 400 mg by mouth daily.    [provider]  omeprazole (PRILOSEC) 20 MG capsule Take 1 capsule (20 mg total) by mouth daily. 12/18/16   Pucilowska, Herma Ard B, MD  traZODone (DESYREL) 100 MG tablet Take 2 tablets (200 mg total) by mouth at bedtime. 04/29/17   Pucilowska, Jolanta B, MD  zolpidem (AMBIEN) 10 MG tablet Take 1 tablet (10 mg total) by mouth at bedtime as needed for sleep. 04/29/17   Clovis Fredrickson, MD  Allergies Nsaids  No family history on file.  Social History Social History   Tobacco Use  . Smoking status: Current Every Day Smoker    Packs/day: 1.50    Years: 10.00    Pack years: 15.00    Types: Cigarettes  . Smokeless tobacco: Never Used  Substance Use Topics  . Alcohol use: No  . Drug use: No    Review of Systems Constitutional: No fever/chills Eyes: No visual changes. ENT: No sore throat. No stiff neck no neck pain Cardiovascular: Denies chest pain. Respiratory: Denies shortness of breath. Gastrointestinal:   no  vomiting.  No diarrhea.  No constipation. Genitourinary: Negative for dysuria. Musculoskeletal: Negative lower extremity swelling Skin: Negative for rash. Neurological: Negative for severe headaches, focal weakness or numbness.   ____________________________________________   PHYSICAL EXAM:  VITAL SIGNS: ED Triage Vitals  Enc Vitals Group     BP 10/07/17 1813 140/73     Pulse Rate 10/07/17 1813 91     Resp 10/07/17 1813 18     Temp 10/07/17 1813 98.4 F (36.9 C)     Temp Source 10/07/17 1813 Oral     SpO2 10/07/17 1813 98 %     Weight 10/07/17 1814 300 lb (136.1 kg)     Height 10/07/17 1814 6' 1"  (1.854 m)     Head Circumference --      Peak Flow --      Pain Score 10/07/17 1814 7     Pain Loc --      Pain Edu? --      Excl. in [place]? --     Constitutional: Alert and oriented. Well appearing and in no acute distress. Eyes: Conjunctivae are normal Head: Atraumatic HEENT: No congestion/rhinnorhea. Mucous membranes are moist.  Oropharynx non-erythematous Neck:   Nontender with no meningismus, no masses, no stridor Cardiovascular: Normal rate, regular rhythm. Grossly normal heart sounds.  Good peripheral circulation. Respiratory: Normal respiratory effort.  No retractions. Lungs CTAB. Abdominal: Soft and tender palpation left lower quadrant no guarding no rebound. No distention. No guarding no rebound Back:  There is no focal tenderness or step off.  there is no midline tenderness there are no lesions noted. there is no CVA tenderness Rectal exam, guaiac negative brown stool Musculoskeletal: No lower extremity tenderness, no upper extremity tenderness. No joint effusions, no DVT signs strong distal pulses no edema Neurologic:  Normal speech and language. No gross focal neurologic deficits are appreciated.  Skin:  Skin is warm, dry and intact. No rash noted. Psychiatric: Mood and affect are normal. Speech and behavior are normal.  ____________________________________________    LABS (all labs ordered are listed, but only abnormal results are displayed)  Labs Reviewed  COMPREHENSIVE METABOLIC PANEL - Abnormal; Notable for the following components:      Result Value   Glucose, Bld 107 (*)    Creatinine, Ser 1.30 (*)    AST 59 (*)    ALT 108 (*)    All other components within normal limits  CBC - Abnormal; Notable for the following components:   WBC 12.3 (*)    All other components within normal limits  POC OCCULT BLOOD, ED  TYPE AND SCREEN    Pertinent labs  results that were available during my care of the patient were reviewed by me and considered in my medical decision making (see chart for details). ____________________________________________  EKG  I personally interpreted any EKGs ordered by me or triage  ____________________________________________  RADIOLOGY  Pertinent labs &  imaging results that were available during my care of the patient were reviewed by me and considered in my medical decision making (see chart for details). If possible, patient and/or family made aware of any abnormal findings.  No results found. ____________________________________________    PROCEDURES  Procedure(s) performed: None  Procedures  Critical Care performed: None  ____________________________________________   INITIAL IMPRESSION / ASSESSMENT AND PLAN / ED COURSE  Pertinent labs & imaging results that were available during my care of the patient were reviewed by me and considered in my medical decision making (see chart for details).  Patient here with nausea vomiting diarrhea, no evidence of GI bleed reassuring blood work, somewhat dehydrated we will give him IV fluid.  Given that he is focal left lower quadrant pain and states he thinks he may have a history of diverticulosis in the past, will obtain CT scan, patient likely will not require admission.  Initially he refused IV fluid but then he changed his mind and accepted after he was ordered  oral meds and nausea medication.  I think he would benefit from some hydration however.  Not actively vomiting in the room, well-appearing    ____________________________________________   FINAL CLINICAL IMPRESSION(S) / ED DIAGNOSES  Final diagnoses:  None      This chart was dictated using voice recognition software.  Despite best efforts to proofread,  errors can occur which can change meaning.      Schuyler Amor, MD 10/07/17 2020

## 2017-10-07 NOTE — ED Triage Notes (Addendum)
Pt c/o dark tarry stools since yesterday with nausea and LUQ pain. States he has a hx of GI bleed.

## 2017-10-07 NOTE — Discharge Instructions (Signed)
Return to the emergency room for any new or worrisome symptoms.

## 2017-10-07 NOTE — ED Notes (Signed)
Pt c/o of dark stools since yesterday. Pt reports n/v and diarrhea. Pt reports hx of GI bleeds.

## 2017-10-27 ENCOUNTER — Emergency Department
Admission: EM | Admit: 2017-10-27 | Discharge: 2017-10-27 | Disposition: A | Discharge status: Home / Self Care | Payer: Medicaid Other | Attending: Emergency Medicine | Admitting: Emergency Medicine

## 2017-10-27 ENCOUNTER — Encounter: Payer: Self-pay | Admitting: Emergency Medicine

## 2017-10-27 ENCOUNTER — Emergency Department: Payer: Medicaid Other

## 2017-10-27 ENCOUNTER — Other Ambulatory Visit: Payer: Self-pay

## 2017-10-27 DIAGNOSIS — Z79899 Other long term (current) drug therapy: Secondary | ICD-10-CM | POA: Insufficient documentation

## 2017-10-27 DIAGNOSIS — F17210 Nicotine dependence, cigarettes, uncomplicated: Secondary | ICD-10-CM | POA: Diagnosis not present

## 2017-10-27 DIAGNOSIS — R0789 Other chest pain: Secondary | ICD-10-CM | POA: Insufficient documentation

## 2017-10-27 LAB — CBC
HEMATOCRIT: 47.8 % (ref 40.0–52.0)
Hemoglobin: 16.4 g/dL (ref 13.0–18.0)
MCH: 31.6 pg (ref 26.0–34.0)
MCHC: 34.3 g/dL (ref 32.0–36.0)
MCV: 92.3 fL (ref 80.0–100.0)
Platelets: 202 10*3/uL (ref 150–440)
RBC: 5.18 MIL/uL (ref 4.40–5.90)
RDW: 14.1 % (ref 11.5–14.5)
WBC: 10.3 10*3/uL (ref 3.8–10.6)

## 2017-10-27 LAB — BASIC METABOLIC PANEL
Anion gap: 9 (ref 5–15)
BUN: 10 mg/dL (ref 6–20)
CHLORIDE: 104 mmol/L (ref 101–111)
CO2: 24 mmol/L (ref 22–32)
Calcium: 9.7 mg/dL (ref 8.9–10.3)
Creatinine, Ser: 1.07 mg/dL (ref 0.61–1.24)
GFR calc Af Amer: >60 mL/min (ref >60)
GFR calc non Af Amer: >60 mL/min (ref >60)
GLUCOSE: 95 mg/dL (ref 65–99)
POTASSIUM: 4.1 mmol/L (ref 3.5–5.1)
Sodium: 137 mmol/L (ref 135–145)

## 2017-10-27 LAB — TROPONIN I
Troponin I: <0.03 ng/mL (ref <0.03)
Troponin I: <0.03 ng/mL (ref <0.03)

## 2017-10-27 MED ORDER — MORPHINE SULFATE (PF) 4 MG/ML IV SOLN
4.0000 mg | Freq: Once | INTRAVENOUS | Status: AC
  Administered 2017-10-27: 4 mg via INTRAVENOUS
  Filled 2017-10-27: qty 1

## 2017-10-27 NOTE — ED Notes (Signed)
MD at bedside to update pt on results.

## 2017-10-27 NOTE — ED Notes (Signed)
Called to check on status of labs.  Will process.

## 2017-10-27 NOTE — Discharge Instructions (Addendum)
You have been seen in the Emergency Department (ED) today for chest pain.  As we have discussed today?s test results are normal, but you may require further testing.  Please follow up with the recommended doctor as instructed above in these documents regarding today?s emergent visit and your recent symptoms to discuss further management.   No driving today as you were given morphine in the Er which can make you drowsy.  Return to the Emergency Department (ED) if you experience any further chest pain/pressure/tightness, difficulty breathing, or sudden sweating, or other symptoms that concern you.

## 2017-10-27 NOTE — ED Notes (Signed)
Pt ambulatory to treatment room without difficulty. NAD noted at this time. Pt placed in gown and shirt remains at bedside. Bed locked and in lowest position at this time.

## 2017-10-27 NOTE — ED Provider Notes (Signed)
[facility] Emergency Department Provider Note   ____________________________________________   None    (approximate)  I have reviewed the triage vital signs and the nursing notes.   HISTORY  Chief Complaint Chest Pain    HPI Carl Martinez is a 27 y.o. male previous history of bipolar disorder, depression, Crohn's disease.  Patient presents for evaluation for chest pain.  Reports she was sitting on his couch, walked outside bent forward and felt his dogs when he stood up he began expressing a sharp pain or left-sided chest.  Initially started left of the chest, he waited at home and took 4 aspirin, baby aspirin.  At about 1 hour into the pain he called EMS, reports that the pain started to feel like it is a very sharp pain just underneath his left nipple.  Slightly worse with moving and bending.  It is not relieved with sitting forward.  He reports he like to make sure his heart is okay as he does have family history of heart attacks occurring in family members at age less than 63.  He denies any personal history of heart disease.  Denies history of hypertension.  Denies diabetes.   No associated abdominal pain.  No recent trips or long travel.  Denies recent hospitalization.  Denies recent surgeries.  No leg swelling.  No cough or hemoptysis.  No history of DVTs or blood clots.  Past Medical History:  Diagnosis Date  . Anxiety   . Bipolar 1 disorder ([place])   . Crohn disease ([place])   . Depression   . Renal disorder    kidney stones    Patient Active Problem List   Diagnosis Date Noted  . Non-compliance with treatment 08/07/2017  . Schizoaffective disorder, bipolar type ([place]) 04/24/2017  . MDD (major depressive disorder), recurrent episode, moderate ([place]) 12/26/2016  . Chronic back pain 12/25/2016  . PTSD (post-traumatic stress disorder) 12/18/2016  . GERD (gastroesophageal reflux disease) 12/18/2016  . Crohn's disease ([place]) 12/12/2016  .  Tobacco use disorder 04/05/2016    Past Surgical History:  Procedure Laterality Date  . CHOLECYSTECTOMY      Prior to Admission medications   Medication Sig Start Date End Date Taking? Authorizing Provider  ARIPiprazole ER 400 MG SRER Inject 400 mg into the muscle every 28 (twenty-eight) days. Next dose due 08/23/17 08/12/17   McNew, Tyson Babinski, MD  cyclobenzaprine (FLEXERIL) 10 MG tablet Take 1 tablet (10 mg total) by mouth 3 (three) times daily. 12/18/16   Pucilowska, Herma Ard B, MD  divalproex (DEPAKOTE) 500 MG DR tablet Take 1 tablet (500 mg total) by mouth every 12 (twelve) hours. 08/12/17   McNew, Tyson Babinski, MD  escitalopram (LEXAPRO) 10 MG tablet Take 1 tablet (10 mg total) by mouth daily. 08/13/17   McNew, Tyson Babinski, MD  gabapentin (NEURONTIN) 300 MG capsule Take 2 capsules (600 mg total) by mouth 3 (three) times daily. 04/29/17   Pucilowska, Herma Ard B, MD  hydrOXYzine (ATARAX/VISTARIL) 50 MG tablet Take 1 tablet (50 mg total) by mouth 3 (three) times daily as needed for anxiety. 04/29/17   Pucilowska, Jolanta B, MD  magnesium oxide (MAG-OX) 400 MG tablet Take 400 mg by mouth daily.    [provider]  omeprazole (PRILOSEC) 20 MG capsule Take 1 capsule (20 mg total) by mouth daily. 12/18/16   Pucilowska, Wardell Honour, MD  ondansetron (ZOFRAN) 4 MG tablet Take 1 tablet (4 mg total) by mouth daily as needed for nausea or vomiting. 10/07/17  Schuyler Amor, MD  traZODone (DESYREL) 100 MG tablet Take 2 tablets (200 mg total) by mouth at bedtime. 04/29/17   Pucilowska, Jolanta B, MD  zolpidem (AMBIEN) 10 MG tablet Take 1 tablet (10 mg total) by mouth at bedtime as needed for sleep. 04/29/17   Pucilowska, Wardell Honour, MD    Allergies Nsaids  No family history on file.  Social History Social History   Tobacco Use  . Smoking status: Current Every Day Smoker    Packs/day: 1.50    Years: 10.00    Pack years: 15.00    Types: Cigarettes  . Smokeless tobacco: Never Used  Substance Use Topics  .  Alcohol use: No  . Drug use: No    Review of Systems Constitutional: No fever/chills Eyes: No visual changes. ENT: No sore throat. Cardiovascular: Sharp pain just below the left nipple, mild at rest, severe with movement.  Worsened by pushing on the area just below the left nipple. Respiratory: Denies shortness of breath. Gastrointestinal: No abdominal pain.  No nausea, no vomiting.  No diarrhea.  No constipation. Genitourinary: Negative for dysuria. Musculoskeletal: Negative for back pain. Skin: Negative for rash. Neurological: Negative for headaches, focal weakness or numbness.    ____________________________________________   PHYSICAL EXAM:  VITAL SIGNS: ED Triage Vitals  Enc Vitals Group     BP 10/27/17 1235 (!) 145/77     Pulse Rate 10/27/17 1235 83     Resp 10/27/17 1235 18     Temp 10/27/17 1235 97.8 F (36.6 C)     Temp Source 10/27/17 1235 Oral     SpO2 10/27/17 1235 99 %     Weight 10/27/17 1235 300 lb (136.1 kg)     Height 10/27/17 1235 6' 1"  (1.854 m)     Head Circumference --      Peak Flow --      Pain Score 10/27/17 1250 8     Pain Loc --      Pain Edu? --      Excl. in [place]? --     Constitutional: Alert and oriented. Well appearing and in no acute distress. Eyes: Conjunctivae are normal. Head: Atraumatic. Nose: No congestion/rhinnorhea. Mouth/Throat: Mucous membranes are moist. Neck: No stridor.   Cardiovascular: Normal rate, regular rhythm. Grossly normal heart sounds.  Good peripheral circulation.  Palpation of the left pectoralis muscle does reproduce his pain.  Reports when palpating over the left nipple/areolar region that he gets a sharp pain feels like it radiates down his left arm and slight towards his left back.  It then goes away with rest, except for a moderate achiness left just below the left nipple.  No skin changes.  No masses. Respiratory: Normal respiratory effort.  No retractions. Lungs CTAB. Gastrointestinal: Soft and nontender. No  distention. Musculoskeletal: No lower extremity tenderness nor edema. Neurologic:  Normal speech and language. No gross focal neurologic deficits are appreciated.  Skin:  Skin is warm, dry and intact. No rash noted. Psychiatric: Mood and affect are normal. Speech and behavior are normal.  ____________________________________________   LABS (all labs ordered are listed, but only abnormal results are displayed)  Labs Reviewed  BASIC METABOLIC PANEL  CBC  TROPONIN I  TROPONIN I   ____________________________________________  EKG  ED ECG REPORT I, Delman Kitten, the attending physician, personally viewed and interpreted this ECG.  Date: 10/27/2017 EKG Time: 1240 Rate: 80 Rhythm: normal sinus rhythm QRS Axis: normal Intervals: normal ST/T Wave abnormalities: normal Narrative Interpretation: no evidence  of acute ischemia  ED ECG REPORT I, Delman Kitten, the attending physician, personally viewed and interpreted this ECG.  Date: 10/27/2017 EKG Time: 1500 Rate: 75 Rhythm: normal sinus rhythm QRS Axis: normal Intervals: normal ST/T Wave abnormalities: normal Narrative Interpretation: no evidence of acute ischemia   ____________________________________________  RADIOLOGY  Chest x-ray reviewed, no acute disease ____________________________________________   PROCEDURES  Procedure(s) performed: None  Procedures  Critical Care performed: No  ____________________________________________   INITIAL IMPRESSION / ASSESSMENT AND PLAN / ED COURSE  Pertinent labs & imaging results that were available during my care of the patient were reviewed by me and considered in my medical decision making (see chart for details).  Differential diagnosis includes, but is not limited to, ACS, aortic dissection, pulmonary embolism, cardiac tamponade, pneumothorax, pneumonia, pericarditis, myocarditis, GI-related causes including esophagitis/gastritis, and musculoskeletal chest wall pain.   My clinical impression is likely musculoskeletal in nature.  Normal first troponin lab work very reassuring.  He lacks notable risk factors other than a history of family heart disease.  He is also a smoker.  His heart score based on his first troponin is low.  Symptoms are very reproducible, likely muscular skeletal nature I suspect possibly an intercostal strain which may occurred while bending or standing up after feeding his dog is at exactly what his pain started.  Stable easily identified area in the left chest wall is causing pain.  Unable to obtain a second troponin, repeat EKG and also give the patient a dose of pain medication.  If repeat EKG and second troponin are reassuring, anticipate discharge to home, recommendations for close outpatient follow-up.       Pulmonary Embolism Rule-out Criteria (PERC rule)                        If YES to ANY of the following, the PERC rule is not satisfied and cannot be used to rule out PE in this patient (consider d-dimer or imaging depending on pre-test probability).                      If NO to ALL of the following, AND the clinician's pre-test probability is <15%, the [facility] rule is satisfied and there is no need for further workup (including no need to obtain a d-dimer) as the post-test probability of pulmonary embolism is <2%.                      Mnemonic is HAD CLOTS   H - hormone use (exogenous estrogen)      No. A - age > 50                                                 No. D - DVT/PE history                                      No.   C - coughing blood (hemoptysis)                 No. L - leg swelling, unilateral  No. O - O2 Sat on Room Air < 95%                  No. T - tachycardia (HR ? 100)                         No. S - surgery or trauma, recent                      No.   Based on my evaluation of the patient, including application of this decision instrument, further testing to evaluate for pulmonary  embolism is not indicated at this time.     ----------------------------------------- 3:30 PM on 10/27/2017 -----------------------------------------  Repeat troponin and repeat EKG both normal and reassuring.  Patient is resting comfortably.  In no distress.  Understands he may not drive himself home today, has his wife coming to get him.  Return precautions and treatment recommendations and follow-up discussed with the patient who is agreeable with the plan.   ____________________________________________   FINAL CLINICAL IMPRESSION(S) / ED DIAGNOSES  Final diagnoses:  Atypical chest pain      NEW MEDICATIONS STARTED DURING THIS VISIT:  New Prescriptions   No medications on file     Note:  This document was prepared using Dragon voice recognition software and may include unintentional dictation errors.     Delman Kitten, MD 10/27/17 1530

## 2017-10-27 NOTE — ED Triage Notes (Signed)
FIRST NURSE NOTE-CP started 1 hr ago.  Arrived by EMS. NAD. Unlabored. Color WNL

## 2017-10-27 NOTE — ED Triage Notes (Signed)
Pt to ED via [facility] from home for left sided chest pain that started about 2 hours ago. Pt states that the pain radiates into his left arm. Pt denies shortness of breath, N/V, and diaphoresis. Pt states that he was at rest when the chest pain started. Pt in NAD at this time.

## 2018-01-21 ENCOUNTER — Other Ambulatory Visit: Payer: Self-pay

## 2018-01-21 ENCOUNTER — Emergency Department: Payer: Medicaid Other

## 2018-01-21 ENCOUNTER — Encounter: Payer: Self-pay | Admitting: Emergency Medicine

## 2018-01-21 ENCOUNTER — Emergency Department
Admission: EM | Admit: 2018-01-21 | Discharge: 2018-01-21 | Disposition: A | Discharge status: Home / Self Care | Payer: Medicaid Other | Attending: Emergency Medicine | Admitting: Emergency Medicine

## 2018-01-21 DIAGNOSIS — F319 Bipolar disorder, unspecified: Secondary | ICD-10-CM | POA: Diagnosis not present

## 2018-01-21 DIAGNOSIS — R1032 Left lower quadrant pain: Secondary | ICD-10-CM | POA: Insufficient documentation

## 2018-01-21 DIAGNOSIS — F17210 Nicotine dependence, cigarettes, uncomplicated: Secondary | ICD-10-CM | POA: Diagnosis not present

## 2018-01-21 DIAGNOSIS — Z9049 Acquired absence of other specified parts of digestive tract: Secondary | ICD-10-CM | POA: Insufficient documentation

## 2018-01-21 DIAGNOSIS — N50812 Left testicular pain: Secondary | ICD-10-CM | POA: Diagnosis not present

## 2018-01-21 DIAGNOSIS — Z79899 Other long term (current) drug therapy: Secondary | ICD-10-CM | POA: Insufficient documentation

## 2018-01-21 DIAGNOSIS — F419 Anxiety disorder, unspecified: Secondary | ICD-10-CM | POA: Diagnosis not present

## 2018-01-21 DIAGNOSIS — N453 Epididymo-orchitis: Secondary | ICD-10-CM

## 2018-01-21 LAB — CBC WITH DIFFERENTIAL/PLATELET
Basophils Absolute: 0.1 10*3/uL (ref 0–0.1)
Basophils Relative: 1 %
Eosinophils Absolute: 0.2 10*3/uL (ref 0–0.7)
Eosinophils Relative: 2 %
HEMATOCRIT: 47 % (ref 40.0–52.0)
Hemoglobin: 16.2 g/dL (ref 13.0–18.0)
LYMPHS ABS: 3.5 10*3/uL (ref 1.0–3.6)
LYMPHS PCT: 27 %
MCH: 31.9 pg (ref 26.0–34.0)
MCHC: 34.4 g/dL (ref 32.0–36.0)
MCV: 92.6 fL (ref 80.0–100.0)
MONO ABS: 1 10*3/uL (ref 0.2–1.0)
MONOS PCT: 7 %
NEUTROS ABS: 8.4 10*3/uL — AB (ref 1.4–6.5)
Neutrophils Relative %: 63 %
Platelets: 221 10*3/uL (ref 150–440)
RBC: 5.08 MIL/uL (ref 4.40–5.90)
RDW: 13.6 % (ref 11.5–14.5)
WBC: 13.3 10*3/uL — ABNORMAL HIGH (ref 3.8–10.6)

## 2018-01-21 LAB — URINALYSIS, COMPLETE (UACMP) WITH MICROSCOPIC
BILIRUBIN URINE: NEGATIVE
Bacteria, UA: NONE SEEN
GLUCOSE, UA: NEGATIVE mg/dL
Hgb urine dipstick: NEGATIVE
KETONES UR: NEGATIVE mg/dL
LEUKOCYTES UA: NEGATIVE
Nitrite: NEGATIVE
PH: 7 (ref 5.0–8.0)
Protein, ur: NEGATIVE mg/dL
SQUAMOUS EPITHELIAL / LPF: NONE SEEN (ref 0–5)
Specific Gravity, Urine: 1.021 (ref 1.005–1.030)

## 2018-01-21 LAB — BASIC METABOLIC PANEL
ANION GAP: 10 (ref 5–15)
BUN: 11 mg/dL (ref 6–20)
CALCIUM: 9.1 mg/dL (ref 8.9–10.3)
CO2: 23 mmol/L (ref 22–32)
CREATININE: 1.06 mg/dL (ref 0.61–1.24)
Chloride: 103 mmol/L (ref 98–111)
GFR calc Af Amer: >60 mL/min (ref >60)
GFR calc non Af Amer: >60 mL/min (ref >60)
Glucose, Bld: 89 mg/dL (ref 70–99)
Potassium: 4 mmol/L (ref 3.5–5.1)
Sodium: 136 mmol/L (ref 135–145)

## 2018-01-21 LAB — CHLAMYDIA/NGC RT PCR (ARMC ONLY)
Chlamydia Tr: NOT DETECTED
N gonorrhoeae: NOT DETECTED

## 2018-01-21 MED ORDER — OXYCODONE HCL 5 MG PO TABS
5.0000 mg | ORAL_TABLET | Freq: Once | ORAL | Status: AC
  Administered 2018-01-21: 5 mg via ORAL
  Filled 2018-01-21: qty 1

## 2018-01-21 MED ORDER — LEVOFLOXACIN 500 MG PO TABS
500.0000 mg | ORAL_TABLET | Freq: Once | ORAL | Status: AC
  Administered 2018-01-21: 500 mg via ORAL
  Filled 2018-01-21: qty 1

## 2018-01-21 MED ORDER — LEVOFLOXACIN 500 MG PO TABS: 500.0000 mg | ORAL_TABLET | Freq: Every day | ORAL | 0 refills | Status: AC

## 2018-01-21 NOTE — ED Provider Notes (Addendum)
[facility] Emergency Department Provider Note ____________________________________________   First MD Initiated Contact with Patient 01/21/18 1604     (approximate)  I have reviewed the triage vital signs and the nursing notes.   HISTORY  Chief Complaint Testicle Pain  HPI Carl Martinez is a 27 y.o. male with history of bipolar disease, kidney stones and Crohn's disease who is presenting to the emergency department left-sided testicular pain over the past 2 days.  He says that the pain has been radiating up to his left lower quadrant of his abdomen.  Says that it is hurting now to walk.  Says that it is now also causing discomfort with urination.  No penile discharge.  No history of STDs.  Says the pain is an 8 out of 10 and feels like a squeezing.  Says that he thinks he is also noticed a swelling to the left testicle.  Past Medical History:  Diagnosis Date  . Anxiety   . Bipolar 1 disorder ([place])   . Crohn disease ([place])   . Depression   . Renal disorder    kidney stones    Patient Active Problem List   Diagnosis Date Noted  . Non-compliance with treatment 08/07/2017  . Schizoaffective disorder, bipolar type ([place]) 04/24/2017  . MDD (major depressive disorder), recurrent episode, moderate ([place]) 12/26/2016  . Chronic back pain 12/25/2016  . PTSD (post-traumatic stress disorder) 12/18/2016  . GERD (gastroesophageal reflux disease) 12/18/2016  . Crohn's disease ([place]) 12/12/2016  . Tobacco use disorder 04/05/2016    Past Surgical History:  Procedure Laterality Date  . CHOLECYSTECTOMY      Prior to Admission medications   Medication Sig Start Date End Date Taking? Authorizing Provider  ARIPiprazole ER 400 MG SRER Inject 400 mg into the muscle every 28 (twenty-eight) days. Next dose due 08/23/17 08/12/17   McNew, Tyson Babinski, MD  cyclobenzaprine (FLEXERIL) 10 MG tablet Take 1 tablet (10 mg total) by mouth 3 (three) times daily. 12/18/16    Pucilowska, Herma Ard B, MD  divalproex (DEPAKOTE) 500 MG DR tablet Take 1 tablet (500 mg total) by mouth every 12 (twelve) hours. 08/12/17   McNew, Tyson Babinski, MD  escitalopram (LEXAPRO) 10 MG tablet Take 1 tablet (10 mg total) by mouth daily. 08/13/17   McNew, Tyson Babinski, MD  gabapentin (NEURONTIN) 300 MG capsule Take 2 capsules (600 mg total) by mouth 3 (three) times daily. 04/29/17   Pucilowska, Herma Ard B, MD  hydrOXYzine (ATARAX/VISTARIL) 50 MG tablet Take 1 tablet (50 mg total) by mouth 3 (three) times daily as needed for anxiety. 04/29/17   Pucilowska, Jolanta B, MD  magnesium oxide (MAG-OX) 400 MG tablet Take 400 mg by mouth daily.    [provider]  omeprazole (PRILOSEC) 20 MG capsule Take 1 capsule (20 mg total) by mouth daily. 12/18/16   Pucilowska, Wardell Honour, MD  ondansetron (ZOFRAN) 4 MG tablet Take 1 tablet (4 mg total) by mouth daily as needed for nausea or vomiting. 10/07/17   Schuyler Amor, MD  traZODone (DESYREL) 100 MG tablet Take 2 tablets (200 mg total) by mouth at bedtime. 04/29/17   Pucilowska, Jolanta B, MD  zolpidem (AMBIEN) 10 MG tablet Take 1 tablet (10 mg total) by mouth at bedtime as needed for sleep. 04/29/17   Pucilowska, Wardell Honour, MD    Allergies Nsaids  History reviewed. No pertinent family history.  Social History Social History   Tobacco Use  . Smoking status: Current Every Day Smoker  Packs/day: 1.50    Years: 10.00    Pack years: 15.00    Types: Cigarettes  . Smokeless tobacco: Never Used  Substance Use Topics  . Alcohol use: No  . Drug use: No    Review of Systems  Constitutional: No fever/chills Eyes: No visual changes. ENT: No sore throat. Cardiovascular: Denies chest pain. Respiratory: Denies shortness of breath. Gastrointestinal:   No nausea, no vomiting.  No diarrhea.  No constipation. Genitourinary: As above Musculoskeletal: Negative for back pain. Skin: Negative for rash. Neurological: Negative for headaches, focal weakness or  numbness.   ____________________________________________   PHYSICAL EXAM:  VITAL SIGNS: ED Triage Vitals  Enc Vitals Group     BP 01/21/18 1557 (!) 180/164     Pulse Rate 01/21/18 1557 76     Resp 01/21/18 1557 20     Temp 01/21/18 1557 98.1 F (36.7 C)     Temp Source 01/21/18 1557 Oral     SpO2 01/21/18 1557 98 %     Weight 01/21/18 1558 300 lb (136.1 kg)     Height 01/21/18 1558 6' 1"  (1.854 m)     Head Circumference --      Peak Flow --      Pain Score 01/21/18 1558 8     Pain Loc --      Pain Edu? --      Excl. in [place]? --     Constitutional: Alert and oriented. Well appearing and in no acute distress. Eyes: Conjunctivae are normal.  Head: Atraumatic. Nose: No congestion/rhinnorhea. Mouth/Throat: Mucous membranes are moist.  Neck: No stridor.   Cardiovascular: Normal rate, regular rhythm. Grossly normal heart sounds.  Good peripheral circulation. Respiratory: Normal respiratory effort.  No retractions. Lungs CTAB. Gastrointestinal: Soft with mild left lower quadrant tenderness to palpation. no distention. No CVA tenderness. Genitourinary: Testicles appear symmetric without any swelling or horizontal lie.  Testicles are at the same level.  Tenderness to palpation to the left testicle as well as the left spermatic cord but the patient says that the tenderness is up in his left groin and left lower quadrant of the abdomen, radiating from the testicle.  No swelling or redness or erythema or warmth. Musculoskeletal: No lower extremity tenderness nor edema.  No joint effusions. Neurologic:  Normal speech and language. No gross focal neurologic deficits are appreciated. Skin:  Skin is warm, dry and intact. No rash noted. Psychiatric: Mood and affect are normal. Speech and behavior are normal.  ____________________________________________   LABS (all labs ordered are listed, but only abnormal results are displayed)  Labs Reviewed  URINALYSIS, COMPLETE (UACMP) WITH  MICROSCOPIC - Abnormal; Notable for the following components:      Result Value   Color, Urine YELLOW (*)    APPearance CLEAR (*)    All other components within normal limits  CBC WITH DIFFERENTIAL/PLATELET - Abnormal; Notable for the following components:   WBC 13.3 (*)    Neutro Abs 8.4 (*)    All other components within normal limits  CHLAMYDIA/NGC RT PCR ([facility] ONLY)  BASIC METABOLIC PANEL   ____________________________________________  EKG   ____________________________________________  RADIOLOGY  Ultrasound of the scrotum is negative for testicular mass or torsion.  Small bilateral hydroceles with small bilateral epididymal cysts.  CT scan with small renal stones but no stones in the ureters.  No acute finding on the CAT scan of the abdomen and pelvis. ____________________________________________   PROCEDURES  Procedure(s) performed:   Procedures  Critical Care performed:  ____________________________________________   INITIAL IMPRESSION / ASSESSMENT AND PLAN / ED COURSE  Pertinent labs & imaging results that were available during my care of the patient were reviewed by me and considered in my medical decision making (see chart for details).  Differential diagnosis includes, but is not limited to, acute appendicitis, renal colic, testicular torsion, urinary tract infection/pyelonephritis, prostatitis,  epididymitis, diverticulitis, small bowel obstruction or ileus, colitis, abdominal aortic aneurysm, gastroenteritis, hernia, etc. As part of my medical decision making, I reviewed the following data within the electronic MEDICAL RECORD NUMBER Notes from prior ED visits  ----------------------------------------- 6:57 PM on 01/21/2018 -----------------------------------------  Patient resting without any distress at this time.  I will treat him clinically for epididymitis.  No evidence of torsion or mass.  No palpable hernia mass or solid mass to the testicles.   No horizontal lie of the testicle on clinical exam.  Patient to follow-up with urology.  He is understanding of the diagnosis as well as treatment plan willing to comply.  I will treat the patient with Levaquin for 10 days.  Checked EKG from this past April and with normal QTC of 422.  Patient says that he has been taking Abilify shot for about a year now.  I feel because of this reassuring EKG finding it is safe for the patient have Levaquin for 10 days. ____________________________________________   FINAL CLINICAL IMPRESSION(S) / ED DIAGNOSES  Final diagnoses:  Pain in left testicle   Epididymoorchitis  NEW MEDICATIONS STARTED DURING THIS VISIT:  New Prescriptions   No medications on file     Note:  This document was prepared using Dragon voice recognition software and may include unintentional dictation errors.     Orbie Pyo, MD 01/21/18 Mauro Kaufmann    Orbie Pyo, MD 01/21/18 [phone]

## 2018-01-21 NOTE — ED Notes (Signed)
Patient transported to Ultrasound 

## 2018-01-21 NOTE — ED Notes (Signed)
Patient transported to CT 

## 2018-01-21 NOTE — ED Triage Notes (Signed)
Pt reports that he developed testicle pain two days ago. Left testicle is swollen more than the right. States that both are hurting. Denies any injury

## 2018-01-21 NOTE — ED Triage Notes (Signed)
Pt states that it hurts when he urinates, the pain is located in his left groin when he urinates.

## 2018-09-04 ENCOUNTER — Emergency Department: Payer: Medicaid Other

## 2018-09-04 ENCOUNTER — Other Ambulatory Visit: Payer: Self-pay

## 2018-09-04 ENCOUNTER — Observation Stay
Admission: EM | Admit: 2018-09-04 | Discharge: 2018-09-05 | Discharge status: Against Medical Advice | Payer: Medicaid Other | Attending: Internal Medicine | Admitting: Internal Medicine

## 2018-09-04 ENCOUNTER — Encounter: Payer: Self-pay | Admitting: Emergency Medicine

## 2018-09-04 DIAGNOSIS — M549 Dorsalgia, unspecified: Secondary | ICD-10-CM | POA: Insufficient documentation

## 2018-09-04 DIAGNOSIS — R079 Chest pain, unspecified: Secondary | ICD-10-CM | POA: Insufficient documentation

## 2018-09-04 DIAGNOSIS — F4310 Post-traumatic stress disorder, unspecified: Secondary | ICD-10-CM | POA: Insufficient documentation

## 2018-09-04 DIAGNOSIS — K5090 Crohn's disease, unspecified, without complications: Secondary | ICD-10-CM | POA: Insufficient documentation

## 2018-09-04 DIAGNOSIS — R4189 Other symptoms and signs involving cognitive functions and awareness: Secondary | ICD-10-CM

## 2018-09-04 DIAGNOSIS — F1410 Cocaine abuse, uncomplicated: Secondary | ICD-10-CM | POA: Insufficient documentation

## 2018-09-04 DIAGNOSIS — F419 Anxiety disorder, unspecified: Secondary | ICD-10-CM | POA: Insufficient documentation

## 2018-09-04 DIAGNOSIS — K219 Gastro-esophageal reflux disease without esophagitis: Secondary | ICD-10-CM | POA: Insufficient documentation

## 2018-09-04 DIAGNOSIS — G8929 Other chronic pain: Secondary | ICD-10-CM | POA: Insufficient documentation

## 2018-09-04 DIAGNOSIS — R4182 Altered mental status, unspecified: Secondary | ICD-10-CM | POA: Insufficient documentation

## 2018-09-04 DIAGNOSIS — R569 Unspecified convulsions: Secondary | ICD-10-CM

## 2018-09-04 DIAGNOSIS — Z87442 Personal history of urinary calculi: Secondary | ICD-10-CM | POA: Diagnosis not present

## 2018-09-04 DIAGNOSIS — H538 Other visual disturbances: Principal | ICD-10-CM | POA: Insufficient documentation

## 2018-09-04 DIAGNOSIS — F250 Schizoaffective disorder, bipolar type: Secondary | ICD-10-CM | POA: Diagnosis not present

## 2018-09-04 DIAGNOSIS — F17210 Nicotine dependence, cigarettes, uncomplicated: Secondary | ICD-10-CM | POA: Insufficient documentation

## 2018-09-04 DIAGNOSIS — E785 Hyperlipidemia, unspecified: Secondary | ICD-10-CM | POA: Insufficient documentation

## 2018-09-04 DIAGNOSIS — Z79899 Other long term (current) drug therapy: Secondary | ICD-10-CM | POA: Insufficient documentation

## 2018-09-04 LAB — DIFFERENTIAL
Abs Immature Granulocytes: 0.05 10*3/uL (ref 0.00–0.07)
Basophils Absolute: 0.1 10*3/uL (ref 0.0–0.1)
Basophils Relative: 0 %
EOS PCT: 3 %
Eosinophils Absolute: 0.4 10*3/uL (ref 0.0–0.5)
Immature Granulocytes: 0 %
Lymphocytes Relative: 38 %
Lymphs Abs: 4.2 10*3/uL — ABNORMAL HIGH (ref 0.7–4.0)
Monocytes Absolute: 0.7 10*3/uL (ref 0.1–1.0)
Monocytes Relative: 6 %
Neutro Abs: 5.9 10*3/uL (ref 1.7–7.7)
Neutrophils Relative %: 53 %

## 2018-09-04 LAB — URINE DRUG SCREEN, QUALITATIVE (ARMC ONLY)
Amphetamines, Ur Screen: POSITIVE — AB
BARBITURATES, UR SCREEN: NOT DETECTED
Benzodiazepine, Ur Scrn: POSITIVE — AB
Cannabinoid 50 Ng, Ur ~~LOC~~: NOT DETECTED
Cocaine Metabolite,Ur ~~LOC~~: POSITIVE — AB
MDMA (Ecstasy)Ur Screen: NOT DETECTED
Methadone Scn, Ur: NOT DETECTED
Opiate, Ur Screen: NOT DETECTED
Phencyclidine (PCP) Ur S: NOT DETECTED
Tricyclic, Ur Screen: POSITIVE — AB

## 2018-09-04 LAB — URINALYSIS, COMPLETE (UACMP) WITH MICROSCOPIC
BACTERIA UA: NONE SEEN
BILIRUBIN URINE: NEGATIVE
Glucose, UA: NEGATIVE mg/dL
HGB URINE DIPSTICK: NEGATIVE
KETONES UR: NEGATIVE mg/dL
LEUKOCYTES UA: NEGATIVE
NITRITE: NEGATIVE
PH: 6 (ref 5.0–8.0)
Protein, ur: NEGATIVE mg/dL
SPECIFIC GRAVITY, URINE: 1.011 (ref 1.005–1.030)
Squamous Epithelial / LPF: NONE SEEN (ref 0–5)

## 2018-09-04 LAB — APTT: APTT: 31 s (ref 24–36)

## 2018-09-04 LAB — COMPREHENSIVE METABOLIC PANEL
ALT: 49 U/L — ABNORMAL HIGH (ref 0–44)
ANION GAP: 7 (ref 5–15)
AST: 34 U/L (ref 15–41)
Albumin: 3.7 g/dL (ref 3.5–5.0)
Alkaline Phosphatase: 78 U/L (ref 38–126)
BUN: 10 mg/dL (ref 6–20)
CO2: 26 mmol/L (ref 22–32)
Calcium: 9.2 mg/dL (ref 8.9–10.3)
Chloride: 105 mmol/L (ref 98–111)
Creatinine, Ser: 0.99 mg/dL (ref 0.61–1.24)
GFR calc Af Amer: >60 mL/min (ref >60)
GFR calc non Af Amer: >60 mL/min (ref >60)
Glucose, Bld: 126 mg/dL — ABNORMAL HIGH (ref 70–99)
Potassium: 3.7 mmol/L (ref 3.5–5.1)
Sodium: 138 mmol/L (ref 135–145)
TOTAL PROTEIN: 6.9 g/dL (ref 6.5–8.1)
Total Bilirubin: 0.4 mg/dL (ref 0.3–1.2)

## 2018-09-04 LAB — CBC
HEMATOCRIT: 43.5 % (ref 39.0–52.0)
Hemoglobin: 14.9 g/dL (ref 13.0–17.0)
MCH: 30.8 pg (ref 26.0–34.0)
MCHC: 34.3 g/dL (ref 30.0–36.0)
MCV: 90.1 fL (ref 80.0–100.0)
NRBC: 0 % (ref 0.0–0.2)
Platelets: 233 10*3/uL (ref 150–400)
RBC: 4.83 MIL/uL (ref 4.22–5.81)
RDW: 12.4 % (ref 11.5–15.5)
WBC: 11.2 10*3/uL — AB (ref 4.0–10.5)

## 2018-09-04 LAB — ETHANOL: Alcohol, Ethyl (B): <10 mg/dL (ref <10)

## 2018-09-04 LAB — LITHIUM LEVEL: Lithium Lvl: 0.22 mmol/L — ABNORMAL LOW (ref 0.60–1.20)

## 2018-09-04 LAB — PROTIME-INR
INR: 0.91
Prothrombin Time: 12.2 seconds (ref 11.4–15.2)

## 2018-09-04 LAB — GLUCOSE, CAPILLARY: Glucose-Capillary: 111 mg/dL — ABNORMAL HIGH (ref 70–99)

## 2018-09-04 LAB — TROPONIN I: Troponin I: <0.03 ng/mL (ref <0.03)

## 2018-09-04 MED ORDER — SODIUM CHLORIDE 0.9 % IV BOLUS
1000.0000 mL | Freq: Once | INTRAVENOUS | Status: AC
  Administered 2018-09-04: 1000 mL via INTRAVENOUS

## 2018-09-04 NOTE — ED Notes (Signed)
Pt states he is having blurry vision Upon RN walking into pt had eyes closed, unwilling to answer questions Responded topain stimuli and no awake, alert, and oriented with no episodes since

## 2018-09-04 NOTE — ED Notes (Signed)
Patient also reports some right sided weakness since Saturday.  Patient ambulatory with steady gait.

## 2018-09-04 NOTE — ED Provider Notes (Addendum)
[facility] Emergency Department Provider Note  ____________________________________________  Time seen: Approximately 7:39 PM  I have reviewed the triage vital signs and the nursing notes.   HISTORY  Chief Complaint Loss of Consciousness and Eye Problem    HPI Carl Martinez is a 28 y.o. male history of bipolar disorder on lithium, anxiety and his depression, PTSD, presenting for chest pain, and episodes of mental status changes.  The patient reports that on Saturday, he had a "severely stressful problem at work and severe stress at home," and developed 30 minutes of chest pain.  He did not have any associated shortness of breath, palpitations, lightheadedness or syncope at that time.  Since then, he has had multiple episodes when he describes that he "spaces out."  He can hear people talking to him but he cannot speak back.  He stares straight ahead.  He also reports some blurred vision.  He also reports some right-sided weakness.  He has not had any recent changes in his medications, has been taking all of his medications as prescribed, has not had any trauma.  No headache.  Patient reports that he is not having any acute psychiatric symptoms at this time.  The symptoms do not feel similar to his prior anxiety or psychiatric illness.  Past Medical History:  Diagnosis Date  . Anxiety   . Bipolar 1 disorder ([place])   . Crohn disease ([place])   . Depression   . Renal disorder    kidney stones    Patient Active Problem List   Diagnosis Date Noted  . Non-compliance with treatment 08/07/2017  . Schizoaffective disorder, bipolar type ([place]) 04/24/2017  . MDD (major depressive disorder), recurrent episode, moderate ([place]) 12/26/2016  . Chronic back pain 12/25/2016  . PTSD (post-traumatic stress disorder) 12/18/2016  . GERD (gastroesophageal reflux disease) 12/18/2016  . Crohn's disease ([place]) 12/12/2016  . Tobacco use disorder 04/05/2016    Past Surgical  History:  Procedure Laterality Date  . CHOLECYSTECTOMY      Current Outpatient Rx  . Order #: [id number] Class: Historical Med  . Order #: [id number] Class: Print  . Order #: [id number] Class: Print  . Order #: [id number] Class: Historical Med  . Order #: [id number] Class: Print  . Order #: [id number] Class: Historical Med  . Order #: [id number] Class: Print  . Order #: [id number] Class: Historical Med  . Order #: [id number] Class: Print  . Order #: [id number] Class: Print  . Order #: [id number] Class: No Print  . Order #: [id number] Class: Print  . Order #: [id number] Class: Print  . Order #: [id number] Class: Print    Allergies Nsaids  No family history on file.  Social History Social History   Tobacco Use  . Smoking status: Current Every Day Smoker    Packs/day: 1.50    Years: 10.00    Pack years: 15.00    Types: Cigarettes  . Smokeless tobacco: Never Used  Substance Use Topics  . Alcohol use: No  . Drug use: No    Review of Systems Constitutional: No fever/chills.  No lightheadedness or syncope.  Positive abnormal episodes of being unable to respond or speak. Eyes: Positive intermittent blurred vision. ENT: No sore throat. No congestion or rhinorrhea. Cardiovascular: Positive chest pain. Denies palpitations. Respiratory: Denies shortness of breath.  No cough. Gastrointestinal: No abdominal pain.  No nausea, no vomiting.  No diarrhea.  No constipation. Genitourinary: Negative for dysuria. Musculoskeletal: Negative for back pain. Skin: Negative for rash. Neurological: Negative for headaches.  Positive right sided weakness and tingling in the  upper extremity.  Positive blurred vision.  Positive occasional episodes of inability to speak.  No difficulty walking Psychiatric:Patient reports that he is not having any acute psychiatric symptoms at this time.  The symptoms do not feel similar to his prior anxiety or psychiatric  illness.  ____________________________________________   PHYSICAL EXAM:  VITAL SIGNS: ED Triage Vitals  Enc Vitals Group     BP 09/04/18 1657 (!) 149/93     Pulse Rate 09/04/18 1657 91     Resp 09/04/18 1657 20     Temp 09/04/18 1657 97.6 F (36.4 C)     Temp Source 09/04/18 1657 Oral     SpO2 09/04/18 1657 96 %     Weight 09/04/18 1659 300 lb (136.1 kg)     Height 09/04/18 1659 6' (1.829 m)     Head Circumference --      Peak Flow --      Pain Score 09/04/18 1704 6     Pain Loc --      Pain Edu? --      Excl. in [place]? --     Constitutional: Alert and oriented. Answers questions appropriately.  GCS is 15. Eyes: Conjunctivae are normal.  EOMI. PERRLA.  No scleral icterus. Head: Atraumatic. Nose: No congestion/rhinnorhea. Mouth/Throat: Mucous membranes are mildly dry.  Neck: No stridor.  Supple.   Cardiovascular: Normal rate, regular rhythm. No murmurs, rubs or gallops.  Respiratory: Normal respiratory effort.  No accessory muscle use or retractions. Lungs CTAB.  No wheezes, rales or ronchi. Gastrointestinal: Soft, nontender and nondistended.  No guarding or rebound.  No peritoneal signs. Musculoskeletal: No LE edema. No ttp in the calves or palpable cords.  Negative Homan's sign. Neurologic: Alert and oriented 3. Speech is clear. . Face and smile symmetric. Tongue is midline.  EOMI.  PERRLA.  On my exam, the patient does have a right pronator drift.  He has 5 out of 5 bilateral grip strength, but 4+ out of 5 biceps and triceps strength on the right side compared to 5 out of 5 on the left.  He has 5 out of 5 bilateral hip flexor dorsiflexion and plantar flexion strength on the left with 4+ out of 5 on the right.  Normal sensation to light touch in the bilateral upper and lower extremities, and face.  However, he does report tingling in the right upper extremity.  Normal gait without ataxia. Skin:  Skin is warm, dry and intact. No rash noted. Psychiatric: The patient has a flat  affect, but is able to answer questions appropriately.  He does not have any pressured speech.  He has good insight.  ____________________________________________   LABS (all labs ordered are listed, but only abnormal results are displayed)  Labs Reviewed  CBC - Abnormal; Notable for the following components:      Result Value   WBC 11.2 (*)    All other components within normal limits  DIFFERENTIAL - Abnormal; Notable for the following components:   Lymphs Abs 4.2 (*)    All other components within normal limits  COMPREHENSIVE METABOLIC PANEL - Abnormal; Notable for the following components:   Glucose, Bld 126 (*)    ALT 49 (*)    All other components within normal limits  GLUCOSE, CAPILLARY - Abnormal; Notable for the following components:   Glucose-Capillary 111 (*)    All other components within normal limits  PROTIME-INR  APTT  TROPONIN I  ETHANOL  URINALYSIS, COMPLETE (UACMP) WITH MICROSCOPIC  URINE DRUG  SCREEN, QUALITATIVE ([facility] ONLY)  LITHIUM LEVEL  CBG MONITORING, ED   ____________________________________________  EKG  ED ECG REPORT I, Anne-Caroline Mariea Clonts, the attending physician, personally viewed and interpreted this ECG.   Date: 09/04/2018  EKG Time: 1710  Rate: 83  Rhythm: normal sinus rhythm  Axis: normal  Intervals:none  ST&T Change: No STEMI; poor baseline in parts of the EKG  ____________________________________________  RADIOLOGY  Dg Chest 2 View  Result Date: 09/04/2018 CLINICAL DATA:  28 year old male with syncopal episodes, double vision, altered mental status. EXAM: CHEST - 2 VIEW COMPARISON:  10/27/2017 and earlier. FINDINGS: Seated AP and lateral views of the chest. Low normal lung volumes. Normal cardiac size and mediastinal contours. Visualized tracheal air column is within normal limits. Both lungs appear clear. No pneumothorax or pleural effusion. Negative visible bowel gas pattern. No acute osseous abnormality identified, but there is age  advanced lower thoracic spine endplate spurring. IMPRESSION: Negative.  No acute cardiopulmonary abnormality. Electronically Signed   By: Genevie Ann M.D.   On: 09/04/2018 20:15   Ct Head Wo Contrast  Result Date: 09/04/2018 CLINICAL DATA:  28 y/o M; double vision and episodes of syncope since Saturday. EXAM: CT HEAD WITHOUT CONTRAST TECHNIQUE: Contiguous axial images were obtained from the base of the skull through the vertex without intravenous contrast. COMPARISON:  None. FINDINGS: Brain: No evidence of acute infarction, hemorrhage, hydrocephalus, extra-axial collection or mass lesion/mass effect. Vascular: No hyperdense vessel or unexpected calcification. Skull: Normal. Negative for fracture or focal lesion. Sinuses/Orbits: No acute finding. Other: None. IMPRESSION: No acute intracranial abnormality identified. Unremarkable CT of the head. Electronically Signed   By: Kristine Garbe M.D.   On: 09/04/2018 17:30    ____________________________________________   PROCEDURES  Procedure(s) performed: None  Procedures  Critical Care performed: No ____________________________________________   INITIAL IMPRESSION / ASSESSMENT AND PLAN / ED COURSE  Pertinent labs & imaging results that were available during my care of the patient were reviewed by me and considered in my medical decision making (see chart for details).  28 y.o. male with a history of multiple underlying psychiatric illnesses on lithium presenting with chest pain in the setting of a stressful episode, now multiple episodes where he feels like he can hear was going on but cannot respond.  He does have some neurologic deficits on my exam.  Overall, the patient is hemodynamically stable.  His CT scan does not show any acute process.  I will get a tele-neurology evaluation, and get an MRI of the brain to rule out acute CVA.  Absence seizure's are also considered although the patient has no history of seizure disorder in the past.  I  will get a lithium level and tox screens to rule out those possibilities.  Basic laboratory studies and a UA are also pending.  Plan reevaluation for final disposition.  ----------------------------------------- 10:02 PM on 09/04/2018 -----------------------------------------  The patient's work-up in the emergency department so far has been reassuring.  He has just given urine so we will follow his urinalysis and drug screen.  I do not have his lithium level back.  The remainder of his laboratory studies are reassuring with a negative alcohol, no anemia, normal electrolytes.  He has a negative troponin.  His chest x-ray shows no acute cardiopulmonary abnormality and his CT head does not show any acute intracranial process.  An MRI of the brain is pending.  I did speak with the tele-neurologist on-call, who recommends admission for possible seizures, which I agree with.  Stress response is still high on the differential.  At this time, the patient will be admitted to Dr. Jannifer Franklin, the hospitalist, for continued evaluation and treatment.  ____________________________________________  FINAL CLINICAL IMPRESSION(S) / ED DIAGNOSES  Final diagnoses:  Chest pain, unspecified type  Recurrent episodes of unresponsiveness  Blurred vision, bilateral         NEW MEDICATIONS STARTED DURING THIS VISIT:  New Prescriptions   No medications on file      Eula Listen, MD 09/04/18 2204    Eula Listen, MD 09/04/18 2204

## 2018-09-04 NOTE — ED Triage Notes (Signed)
Patient presents to the ED and states that since Saturday he has been having double vision and has been frequently passing out.  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  Patient also reports episodes of difficulty speaking.  Patient was ambulatory to triage, speaking in full sentences at this time.  Patient staring forward while speaking.  Patient reports high stress at work and states Saturday he had some chest pain that lasted 30 minutes.

## 2018-09-04 NOTE — ED Notes (Signed)
Pt wanting to leave at this time. Pt counseled that he should stay and that it may be dangerous to leave. Dr. Mariea Clonts made aware and she will speak to pt

## 2018-09-04 NOTE — ED Notes (Signed)
Patient transported to MRI 

## 2018-09-04 NOTE — ED Notes (Signed)
MD and RN have both spoken to telenuero, awaiting consult at this time

## 2018-09-05 ENCOUNTER — Other Ambulatory Visit: Payer: Self-pay | Admitting: Internal Medicine

## 2018-09-05 ENCOUNTER — Other Ambulatory Visit: Payer: Self-pay

## 2018-09-05 ENCOUNTER — Encounter: Payer: Self-pay | Admitting: *Deleted

## 2018-09-05 DIAGNOSIS — H538 Other visual disturbances: Secondary | ICD-10-CM | POA: Diagnosis not present

## 2018-09-05 DIAGNOSIS — R569 Unspecified convulsions: Secondary | ICD-10-CM

## 2018-09-05 LAB — TSH: TSH: 1.811 u[IU]/mL (ref 0.350–4.500)

## 2018-09-05 MED ORDER — VALPROIC ACID 250 MG PO CAPS: 500.0000 mg | ORAL_CAPSULE | Freq: Two times a day (BID) | ORAL | 0 refills | Status: AC

## 2018-09-05 MED ORDER — LITHIUM CARBONATE ER 450 MG PO TBCR
450.0000 mg | EXTENDED_RELEASE_TABLET | Freq: Two times a day (BID) | ORAL | Status: DC
  Administered 2018-09-05 (×2): 450 mg via ORAL
  Filled 2018-09-05 (×3): qty 1

## 2018-09-05 MED ORDER — GABAPENTIN 300 MG PO CAPS
600.0000 mg | ORAL_CAPSULE | Freq: Three times a day (TID) | ORAL | Status: DC
  Administered 2018-09-05: 09:00:00 600 mg via ORAL
  Filled 2018-09-05: qty 2

## 2018-09-05 MED ORDER — ONDANSETRON HCL 4 MG PO TABS: 4.0000 mg | ORAL_TABLET | Freq: Four times a day (QID) | ORAL | Status: DC | PRN

## 2018-09-05 MED ORDER — HALOPERIDOL 5 MG PO TABS
10.0000 mg | ORAL_TABLET | Freq: Every day | ORAL | Status: DC
  Filled 2018-09-05: qty 2

## 2018-09-05 MED ORDER — ZOLPIDEM TARTRATE 5 MG PO TABS: 10.0000 mg | ORAL_TABLET | Freq: Every evening | ORAL | Status: DC | PRN

## 2018-09-05 MED ORDER — PERPHENAZINE 4 MG PO TABS
8.0000 mg | ORAL_TABLET | Freq: Every day | ORAL | Status: DC
  Filled 2018-09-05: qty 2

## 2018-09-05 MED ORDER — HALOPERIDOL 5 MG PO TABS
5.0000 mg | ORAL_TABLET | Freq: Two times a day (BID) | ORAL | Status: DC
  Administered 2018-09-05: 09:00:00 5 mg via ORAL
  Filled 2018-09-05 (×2): qty 1

## 2018-09-05 MED ORDER — PANTOPRAZOLE SODIUM 40 MG PO TBEC
40.0000 mg | DELAYED_RELEASE_TABLET | Freq: Every day | ORAL | Status: DC
  Administered 2018-09-05: 09:00:00 40 mg via ORAL
  Filled 2018-09-05: qty 1

## 2018-09-05 MED ORDER — VALPROIC ACID 250 MG PO CAPS
500.0000 mg | ORAL_CAPSULE | Freq: Two times a day (BID) | ORAL | Status: DC
  Filled 2018-09-05 (×2): qty 2

## 2018-09-05 MED ORDER — HYDROXYZINE HCL 50 MG PO TABS
50.0000 mg | ORAL_TABLET | Freq: Three times a day (TID) | ORAL | Status: DC | PRN
  Administered 2018-09-05: 04:00:00 50 mg via ORAL
  Filled 2018-09-05 (×2): qty 1

## 2018-09-05 MED ORDER — CYCLOBENZAPRINE HCL 10 MG PO TABS
10.0000 mg | ORAL_TABLET | Freq: Three times a day (TID) | ORAL | Status: DC
  Administered 2018-09-05: 10 mg via ORAL
  Filled 2018-09-05: qty 1

## 2018-09-05 MED ORDER — ONDANSETRON HCL 4 MG/2ML IJ SOLN: 4.0000 mg | Freq: Four times a day (QID) | INTRAMUSCULAR | Status: DC | PRN

## 2018-09-05 MED ORDER — ENOXAPARIN SODIUM 40 MG/0.4ML ~~LOC~~ SOLN
40.0000 mg | Freq: Two times a day (BID) | SUBCUTANEOUS | Status: DC
  Administered 2018-09-05: 09:00:00 40 mg via SUBCUTANEOUS
  Filled 2018-09-05: qty 0.4

## 2018-09-05 MED ORDER — PERPHENAZINE 4 MG PO TABS
4.0000 mg | ORAL_TABLET | Freq: Two times a day (BID) | ORAL | Status: DC
  Administered 2018-09-05: 4 mg via ORAL
  Filled 2018-09-05 (×2): qty 1

## 2018-09-05 MED ORDER — ACETAMINOPHEN 325 MG PO TABS: 650.0000 mg | ORAL_TABLET | Freq: Four times a day (QID) | ORAL | Status: DC | PRN

## 2018-09-05 MED ORDER — PNEUMOCOCCAL VAC POLYVALENT 25 MCG/0.5ML IJ INJ: 0.5000 mL | INJECTION | INTRAMUSCULAR | Status: DC

## 2018-09-05 MED ORDER — DOCUSATE SODIUM 100 MG PO CAPS
100.0000 mg | ORAL_CAPSULE | Freq: Two times a day (BID) | ORAL | Status: DC
  Filled 2018-09-05: qty 1

## 2018-09-05 MED ORDER — TRAZODONE HCL 50 MG PO TABS: 200.0000 mg | ORAL_TABLET | Freq: Every day | ORAL | Status: DC

## 2018-09-05 MED ORDER — ATORVASTATIN CALCIUM 20 MG PO TABS: 10.0000 mg | ORAL_TABLET | Freq: Every evening | ORAL | Status: DC

## 2018-09-05 MED ORDER — ACETAMINOPHEN 650 MG RE SUPP: 650.0000 mg | Freq: Four times a day (QID) | RECTAL | Status: DC | PRN

## 2018-09-05 NOTE — Discharge Instructions (Signed)
It was so nice to meet you during this hospitalization!  You came into the hospital with possible seizures. The neurologist started you on a seizure medicine called Valproic acid. Please take 2 tablets twice a day.  Please make sure you see a neurologist in the next couple of weeks.  Take care, Dr. Brett Albino

## 2018-09-05 NOTE — Consult Note (Addendum)
NEUROLOGY CONSULT  Reason for Consult: Fainting  Referring Physician: Dr. Marcille Blanco  CC: Fainting   HPI: Carl Martinez is an 28 y.o. male with past medical history significant for bipolar disease, multiple substance abuse, who presents to the hospital complaining this is been under control about couple episode of near fainting.  Patient reported that starting on Saturday he has been experiencing multiple episodes of near fainting, patient reported that he will be in his usual state of health and all of a sudden he will fall asleep and needs somebody to shake him to wake up he stated that he would not be able to speak he would be confused when he had those episodes. Patient stated that his episode has been resolved now he denied being intoxicated while having those episodes however his drug screen was positive for cocaine and amphetamine.  Patient was admitted to the hospital to rule out seizures. Patient has bipolar disease and he is on lithium, he stated that his bipolar has been under control. Patient denies any fever, no slurred speech, no shortness of breath, no nausea or vomiting, no focal motor weakness.  Past Medical History Past Medical History:  Diagnosis Date  . Anxiety   . Bipolar 1 disorder ([place])   . Crohn disease ([place])   . Depression   . Renal disorder    kidney stones    Past Surgical History Past Surgical History:  Procedure Laterality Date  . CHOLECYSTECTOMY      Family History History reviewed. No pertinent family history.  Social History    reports that he has been smoking cigarettes. He has a 15.00 pack-year smoking history. He has never used smokeless tobacco. He reports that he does not drink alcohol or use drugs.  Allergies Allergies  Allergen Reactions  . Nsaids Swelling    Chrons- swell up on inside per pt    Home Medications Medications Prior to Admission  Medication Sig Dispense Refill  . atorvastatin (LIPITOR) 10 MG tablet Take 10 mg by  mouth every evening.    . cyclobenzaprine (FLEXERIL) 10 MG tablet Take 1 tablet (10 mg total) by mouth 3 (three) times daily. 90 tablet 0  . gabapentin (NEURONTIN) 300 MG capsule Take 2 capsules (600 mg total) by mouth 3 (three) times daily. 180 capsule 1  . haloperidol (HALDOL) 5 MG tablet Take 5-10 mg by mouth Nightly. Take one tablet in the morning, one tablet in the afternoon and two tablets at bedtime    . hydrOXYzine (ATARAX/VISTARIL) 50 MG tablet Take 1 tablet (50 mg total) by mouth 3 (three) times daily as needed for anxiety. 90 tablet 1  . lithium carbonate (ESKALITH) 450 MG CR tablet Take 450 mg by mouth every 12 (twelve) hours.    Marland Kitchen omeprazole (PRILOSEC) 20 MG capsule Take 1 capsule (20 mg total) by mouth daily. 30 capsule 0  . perphenazine (TRILAFON) 4 MG tablet Take 4-8 mg by mouth 3 (three) times daily.    . traZODone (DESYREL) 100 MG tablet Take 2 tablets (200 mg total) by mouth at bedtime. 60 tablet 1  . zolpidem (AMBIEN) 10 MG tablet Take 1 tablet (10 mg total) by mouth at bedtime as needed for sleep. 30 tablet 0    Hospital Medications . atorvastatin  10 mg Oral QPM  . cyclobenzaprine  10 mg Oral TID  . docusate sodium  100 mg Oral BID  . enoxaparin (LOVENOX) injection  40 mg Subcutaneous Q12H  . gabapentin  600 mg Oral TID  .  haloperidol  10 mg Oral QHS  . haloperidol  5 mg Oral BID  . lithium carbonate  450 mg Oral Q12H  . pantoprazole  40 mg Oral Daily  . perphenazine  4 mg Oral BID  . perphenazine  8 mg Oral QHS  . [START ON 09/06/2018] pneumococcal 23 valent vaccine  0.5 mL Intramuscular Tomorrow-1000  . traZODone  200 mg Oral QHS     ROS:  Negative except of what is reported in HPI      Physical Examination:  Vitals:   09/04/18 2323 09/05/18 0235 09/05/18 0328 09/05/18 0800  BP: 129/78 134/71 128/84 130/83  Pulse: 73 95 85 88  Resp: 19 16  18   Temp:   98.4 F (36.9 C) 98.6 F (37 C)  TempSrc:   Oral Oral  SpO2: 97% 95% 94% 98%  Weight:   135.9 kg    Height:   6' (1.829 m)     General - agitated  Heart - Regular rate and rhythm - no murmer Lungs - Clear to auscultation Abdomen - Soft - non tender Extremities - Distal pulses intact - no edema Skin - Warm and dry  Neurologic Examination:   Mental Status:  Alert, oriented.  Speech without evidence of dysarthria or aphasia. Able to follow 3 step commands without difficulty.  Cranial Nerves:  bilateral visual fields intact Pupils were equal and reacted. Extraocular movements were full.  -no facial numbness and no facial weakness.  hearing normal.  normal speech and symmetrical palatal movement.  midline tongue extension  Motor: Right : Upper extremity   5/5    Left:     Upper extremity   5/5  Lower extremity   5/5     Lower extremity   5/5 Tone and bulk:normal tone throughout; no atrophy noted Sensory: Intact to light touch in all extremities. Deep Tendon Reflexes: 2/4 throughout Plantars: Downgoing bilaterally  Cerebellar: Normal finger to nose and heel to shin bilaterally. Gait: not tested   LABORATORY STUDIES:  Basic Metabolic Panel: Recent Labs  Lab 09/04/18 1714  NA 138  K 3.7  CL 105  CO2 26  GLUCOSE 126*  BUN 10  CREATININE 0.99  CALCIUM 9.2    Liver Function Tests: Recent Labs  Lab 09/04/18 1714  AST 34  ALT 49*  ALKPHOS 78  BILITOT 0.4  PROT 6.9  ALBUMIN 3.7   No results for input(s): LIPASE, AMYLASE in the last 168 hours. No results for input(s): AMMONIA in the last 168 hours.  CBC: Recent Labs  Lab 09/04/18 1714  WBC 11.2*  NEUTROABS 5.9  HGB 14.9  HCT 43.5  MCV 90.1  PLT 233    Cardiac Enzymes: Recent Labs  Lab 09/04/18 1714  TROPONINI <0.03    BNP: Invalid input(s): POCBNP  CBG: Recent Labs  Lab 09/04/18 1711  GLUCAP 111*    Microbiology:   Coagulation Studies: Recent Labs    09/04/18 1714  LABPROT 12.2  INR 0.91    Urinalysis:  Recent Labs  Lab 09/04/18 2205  COLORURINE YELLOW*  LABSPEC 1.011   PHURINE 6.0  GLUCOSEU NEGATIVE  HGBUR NEGATIVE  BILIRUBINUR NEGATIVE  KETONESUR NEGATIVE  PROTEINUR NEGATIVE  NITRITE NEGATIVE  LEUKOCYTESUR NEGATIVE    Lipid Panel:     Component Value Date/Time   CHOL 263 (H) 04/25/2017 0641   TRIG 396 (H) 04/25/2017 0641   HDL 31 (L) 04/25/2017 0641   CHOLHDL 8.5 04/25/2017 0641   VLDL 79 (H) 04/25/2017 7858  LDLCALC 153 (H) 04/25/2017 0641    HgbA1C:  Lab Results  Component Value Date   HGBA1C 5.2 04/25/2017    Urine Drug Screen:      Component Value Date/Time   LABOPIA NONE DETECTED 09/04/2018 2205   COCAINSCRNUR POSITIVE (A) 09/04/2018 2205   LABBENZ POSITIVE (A) 09/04/2018 2205   AMPHETMU POSITIVE (A) 09/04/2018 2205   THCU NONE DETECTED 09/04/2018 2205   LABBARB NONE DETECTED 09/04/2018 2205     Alcohol Level:  Recent Labs  Lab 09/04/18 1959  ETH <10    Miscellaneous labs:  EKG  EKG  IMAGING: Dg Chest 2 View  Result Date: 09/04/2018 CLINICAL DATA:  28 year old male with syncopal episodes, double vision, altered mental status. EXAM: CHEST - 2 VIEW COMPARISON:  10/27/2017 and earlier. FINDINGS: Seated AP and lateral views of the chest. Low normal lung volumes. Normal cardiac size and mediastinal contours. Visualized tracheal air column is within normal limits. Both lungs appear clear. No pneumothorax or pleural effusion. Negative visible bowel gas pattern. No acute osseous abnormality identified, but there is age advanced lower thoracic spine endplate spurring. IMPRESSION: Negative.  No acute cardiopulmonary abnormality. Electronically Signed   By: Genevie Ann M.D.   On: 09/04/2018 20:15   Ct Head Wo Contrast  Result Date: 09/04/2018 CLINICAL DATA:  28 y/o M; double vision and episodes of syncope since Saturday. EXAM: CT HEAD WITHOUT CONTRAST TECHNIQUE: Contiguous axial images were obtained from the base of the skull through the vertex without intravenous contrast. COMPARISON:  None. FINDINGS: Brain: No evidence of acute  infarction, hemorrhage, hydrocephalus, extra-axial collection or mass lesion/mass effect. Vascular: No hyperdense vessel or unexpected calcification. Skull: Normal. Negative for fracture or focal lesion. Sinuses/Orbits: No acute finding. Other: None. IMPRESSION: No acute intracranial abnormality identified. Unremarkable CT of the head. Electronically Signed   By: Kristine Garbe M.D.   On: 09/04/2018 17:30   Mr Brain Wo Contrast  Result Date: 09/04/2018 CLINICAL DATA:  28 y/o M; episodes of altered mental status, blurred vision, right sided weakness. EXAM: MRI HEAD WITHOUT CONTRAST TECHNIQUE: Multiplanar, multiecho pulse sequences of the brain and surrounding structures were obtained without intravenous contrast. COMPARISON:  09/04/2018 CT head FINDINGS: Brain: No acute infarction, hemorrhage, hydrocephalus, extra-axial collection or mass lesion. No structural or signal abnormality of the brain identified. Vascular: Normal flow voids. Skull and upper cervical spine: Normal marrow signal. Sinuses/Orbits: Mild right anterior ethmoid air cell and left maxillary sinus mucosal thickening. Small right maxillary sinus mucous retention cyst. No abnormal signal of mastoid air cells. Orbits are unremarkable. Other: None. IMPRESSION: Normal MRI of the brain. Electronically Signed   By: Kristine Garbe M.D.   On: 09/04/2018 22:30     Assessment/Plan:  28 years old gentleman with past medical history significant for bipolar disease on lithium multiple substance abuse who presented to the hospital with multiple episodes of near syncope while  intoxicated with methamphetamine and cocaine.  -It is unlikely that the patient is having seizures given the fact that his urine drug screen was positive for methamphetamine and cocaine, however it is impossible for [place] to rule out seizure completely.  MRI of the brain was reviewed was done without contrast and shows no acute intracranial abnormalities, patient  was counseled about the importance of quitting illegal drugs but he denies using any cocaine or amphetamine.  We will obtain an EEG ( can be done as out patient), will start Depakote to help him with his mood and the potential for having  seizures, he needs an MRI with contrast as an outpatient. -Trazadone can lower sz threshold, discontinue if possible. -Patient needs psychiatric evaluation given the fact that he he has bipolar, he needs a lithium level. -Start Depakote 500 mg BID, follow up on LFTs in one week -Out patient neurology clinic  -PT/OT    Arnaldo Natal, MD

## 2018-09-05 NOTE — Progress Notes (Signed)
Anticoagulation monitoring(Lovenox):  28 yo male ordered Lovenox 40 mg Q24h  Filed Weights   09/04/18 1659  Weight: 300 lb (136.1 kg)   BMI 40.69   Lab Results  Component Value Date   CREATININE 0.99 09/04/2018   CREATININE 1.06 01/21/2018   CREATININE 1.07 10/27/2017   Estimated Creatinine Clearance: 157.3 mL/min (by C-G formula based on SCr of 0.99 mg/dL). Hemoglobin & Hematocrit     Component Value Date/Time   HGB 14.9 09/04/2018 1714   HGB 16.9 11/09/2013 0942   HCT 43.5 09/04/2018 1714   HCT 50.2 11/09/2013 0942     Per Protocol for Patient with estCrcl > 30 ml/min and BMI > 40, will transition to Lovenox 40 mg Q12h.

## 2018-09-05 NOTE — Progress Notes (Signed)
Patient notified by care team that psych consult was required prior to d/c. Patient refusing psych consult and has ride waiting outside. Notified Dr. Brett Albino. RN in to deliver [place] documentation to patient though patient was already gone. Prescription was sent to [facility] in [place] by Dr. Brett Albino.

## 2018-09-05 NOTE — Discharge Summary (Signed)
Carl Martinez NAME: Author Hatlestad    MR#:  [id number]  DATE OF BIRTH:  March 24, 1991  DATE OF ADMISSION:  09/04/2018   ADMITTING PHYSICIAN: Harrie Foreman, MD  DATE OF DISCHARGE: 09/05/18  PRIMARY CARE PHYSICIAN: Elaina Pattee, MD   ADMISSION DIAGNOSIS:  Blurred vision, bilateral [H53.8] Recurrent episodes of unresponsiveness [R41.82] Chest pain, unspecified type [R07.9] DISCHARGE DIAGNOSIS:  Active Problems:   Seizure-like activity ([place])  SECONDARY DIAGNOSIS:   Past Medical History:  Diagnosis Date  . Anxiety   . Bipolar 1 disorder ([place])   . Crohn disease ([place])   . Depression   . Renal disorder    kidney stones   HOSPITAL COURSE:   Carl Martinez is a 28 year old male who presented to the ED with episodes of "zoning out".  In the ED, telemetry neurology was consulted and recommended admission for seizure work-up.  UDS was positive for cocaine, methamphetamines, and benzos. He was admitted for further management.  1.  Seizure activity- felt to be unlikely, although unable to completely rule out -MRI brain was negative -Seen by neurology and started on Depakote 500 mg twice daily -EEG ordered, but patient left AMA prior to having this done -Patient should follow-up with neurology as an outpatient -Neurology also recommended that he receive MRI with contrast as an outpatient  2.  Polysubstance abuse: Urine tox screen positive for cocaine, methamphetamines as well as benzos.   -Patient counseled this admission abstaining from illegal substances  3.  Bipolar disorder -Home meds were continued -Psychiatry was consulted, but patient left AMA before he could be seen -Needs to follow-up with outpatient psychiatry.  Consider discontinuing trazodone as it can lower seizure threshold.  4.  Hyperlipidemia: Likely secondary to antipsychotic medications -Continued statin  DISCHARGE CONDITIONS:  ?  Seizure-like  activity Polysubstance abuse Bipolar disorder Hyperlipidemia CONSULTS OBTAINED:  Treatment Team:  Catarina Hartshorn, MD DRUG ALLERGIES:   Allergies  Allergen Reactions  . Nsaids Swelling    Chrons- swell up on inside per pt   DISCHARGE MEDICATIONS:   Allergies as of 09/05/2018      Reactions   Nsaids Swelling   Chrons- swell up on inside per pt      Medication List    TAKE these medications   atorvastatin 10 MG tablet Commonly known as:  LIPITOR Take 10 mg by mouth every evening.   cyclobenzaprine 10 MG tablet Commonly known as:  FLEXERIL Take 1 tablet (10 mg total) by mouth 3 (three) times daily.   gabapentin 300 MG capsule Commonly known as:  NEURONTIN Take 2 capsules (600 mg total) by mouth 3 (three) times daily.   haloperidol 5 MG tablet Commonly known as:  HALDOL Take 5-10 mg by mouth Nightly. Take one tablet in the morning, one tablet in the afternoon and two tablets at bedtime   hydrOXYzine 50 MG tablet Commonly known as:  ATARAX/VISTARIL Take 1 tablet (50 mg total) by mouth 3 (three) times daily as needed for anxiety.   lithium carbonate 450 MG CR tablet Commonly known as:  ESKALITH Take 450 mg by mouth every 12 (twelve) hours.   omeprazole 20 MG capsule Commonly known as:  PRILOSEC Take 1 capsule (20 mg total) by mouth daily.   perphenazine 4 MG tablet Commonly known as:  TRILAFON Take 4-8 mg by mouth 3 (three) times daily.   traZODone 100 MG tablet Commonly known as:  DESYREL Take 2 tablets (200 mg total) by  mouth at bedtime.   zolpidem 10 MG tablet Commonly known as:  AMBIEN Take 1 tablet (10 mg total) by mouth at bedtime as needed for sleep.        DISCHARGE INSTRUCTIONS:  1.  Follow-up with PCP in 5 days 2.  Follow-up with psychiatry in 1 week 3.  Follow-up with neurology in 2 to 3 weeks 4.  Start Depakote 500 mg twice daily 5.  Needs EEG and MRI with contrast as an outpatient DIET:  Cardiac diet DISCHARGE CONDITION:   Stable ACTIVITY:  Activity as tolerated OXYGEN:  Home Oxygen: No.  Oxygen Delivery: room air DISCHARGE LOCATION:  home   If you experience worsening of your admission symptoms, develop shortness of breath, life threatening emergency, suicidal or homicidal thoughts you must seek medical attention immediately by calling 911 or calling your MD immediately  if symptoms less severe.  You Must read complete instructions/literature along with all the possible adverse reactions/side effects for all the Medicines you take and that have been prescribed to you. Take any new Medicines after you have completely understood and accpet all the possible adverse reactions/side effects.   Please note  You were cared for by a hospitalist during your hospital stay. If you have any questions about your discharge medications or the care you received while you were in the hospital after you are discharged, you can call the unit and asked to speak with the hospitalist on call if the hospitalist that took care of you is not available. Once you are discharged, your primary care physician will handle any further medical issues. Please note that NO REFILLS for any discharge medications will be authorized once you are discharged, as it is imperative that you return to your primary care physician (or establish a relationship with a primary care physician if you do not have one) for your aftercare needs so that they can reassess your need for medications and monitor your lab values.    On the day of Discharge:  VITAL SIGNS:  Blood pressure 130/83, pulse 88, temperature 98.6 F (37 C), temperature source Oral, resp. rate 18, height 6' (1.829 m), weight 135.9 kg, SpO2 98 %. PHYSICAL EXAMINATION:  GENERAL:  28 y.o.-year-old patient lying in the bed with no acute distress.  EYES: Pupils equal, round, reactive to light and accommodation. No scleral icterus. Extraocular muscles intact.  HEENT: Head atraumatic, normocephalic.  Oropharynx and nasopharynx clear.  NECK:  Supple, no jugular venous distention. No thyroid enlargement, no tenderness.  LUNGS: Normal breath sounds bilaterally, no wheezing, rales,rhonchi or crepitation. No use of accessory muscles of respiration.  CARDIOVASCULAR: S1, S2 normal. No murmurs, rubs, or gallops.  ABDOMEN: Soft, non-tender, non-distended. Bowel sounds present. No organomegaly or mass.  EXTREMITIES: No pedal edema, cyanosis, or clubbing.  NEUROLOGIC: Cranial nerves II through XII are intact. Muscle strength 5/5 in all extremities. Sensation intact. Gait not checked.  PSYCHIATRIC: The patient is alert and oriented x 3.  SKIN: No obvious rash, lesion, or ulcer.  DATA REVIEW:   CBC Recent Labs  Lab 09/04/18 1714  WBC 11.2*  HGB 14.9  HCT 43.5  PLT 233    Chemistries  Recent Labs  Lab 09/04/18 1714  NA 138  K 3.7  CL 105  CO2 26  GLUCOSE 126*  BUN 10  CREATININE 0.99  CALCIUM 9.2  AST 34  ALT 49*  ALKPHOS 78  BILITOT 0.4     Microbiology Results  Results for orders placed or performed during the  hospital encounter of 01/21/18  [place] rt PCR ([place] only)     Status: None   Collection Time: 01/21/18  4:41 PM  Result Value Ref Range Status   Specimen source GC/Chlam URINE, RANDOM  Final   Chlamydia Tr NOT DETECTED NOT DETECTED Final   N gonorrhoeae NOT DETECTED NOT DETECTED Final    Comment: (NOTE) 100  This methodology has not been evaluated in pregnant women or in 200  patients with a history of hysterectomy. 300 400  This methodology will not be performed on patients less than 15  years of age. Performed at [facility], [address] [place], [place] [address]     RADIOLOGY:  Dg Chest 2 View  Result Date: 09/04/2018 CLINICAL DATA:  28 year old male with syncopal episodes, double vision, altered mental status. EXAM: CHEST - 2 VIEW COMPARISON:  10/27/2017 and earlier. FINDINGS: Seated AP and lateral views of the chest. Low normal  lung volumes. Normal cardiac size and mediastinal contours. Visualized tracheal air column is within normal limits. Both lungs appear clear. No pneumothorax or pleural effusion. Negative visible bowel gas pattern. No acute osseous abnormality identified, but there is age advanced lower thoracic spine endplate spurring. IMPRESSION: Negative.  No acute cardiopulmonary abnormality. Electronically Signed   By: Genevie Ann M.D.   On: 09/04/2018 20:15   Ct Head Wo Contrast  Result Date: 09/04/2018 CLINICAL DATA:  28 y/o M; double vision and episodes of syncope since Saturday. EXAM: CT HEAD WITHOUT CONTRAST TECHNIQUE: Contiguous axial images were obtained from the base of the skull through the vertex without intravenous contrast. COMPARISON:  None. FINDINGS: Brain: No evidence of acute infarction, hemorrhage, hydrocephalus, extra-axial collection or mass lesion/mass effect. Vascular: No hyperdense vessel or unexpected calcification. Skull: Normal. Negative for fracture or focal lesion. Sinuses/Orbits: No acute finding. Other: None. IMPRESSION: No acute intracranial abnormality identified. Unremarkable CT of the head. Electronically Signed   By: Kristine Garbe M.D.   On: 09/04/2018 17:30   Mr Brain Wo Contrast  Result Date: 09/04/2018 CLINICAL DATA:  28 y/o M; episodes of altered mental status, blurred vision, right sided weakness. EXAM: MRI HEAD WITHOUT CONTRAST TECHNIQUE: Multiplanar, multiecho pulse sequences of the brain and surrounding structures were obtained without intravenous contrast. COMPARISON:  09/04/2018 CT head FINDINGS: Brain: No acute infarction, hemorrhage, hydrocephalus, extra-axial collection or mass lesion. No structural or signal abnormality of the brain identified. Vascular: Normal flow voids. Skull and upper cervical spine: Normal marrow signal. Sinuses/Orbits: Mild right anterior ethmoid air cell and left maxillary sinus mucosal thickening. Small right maxillary sinus mucous retention  cyst. No abnormal signal of mastoid air cells. Orbits are unremarkable. Other: None. IMPRESSION: Normal MRI of the brain. Electronically Signed   By: Kristine Garbe M.D.   On: 09/04/2018 22:30     Management plans discussed with the patient, family and they are in agreement.  CODE STATUS: Full Code   TOTAL TIME TAKING CARE OF THIS PATIENT: 40 minutes.    Berna Spare Ataya Murdy M.D on 09/05/2018 at 10:52 AM  Between 7am to 6pm - Pager - [phone]  After 6pm go to [email or web address] - [employer] [facility]  Office  [phone]  CC: Primary care physician; Elaina Pattee, MD   Note: This dictation was prepared with Dragon dictation along with smaller phrase technology. Any transcriptional errors that result from this process are unintentional.

## 2018-09-05 NOTE — Plan of Care (Signed)
Pt. Discharge paperwork reviewed

## 2018-09-05 NOTE — H&P (Signed)
Carl Martinez is an 28 y.o. male.   Chief Complaint: Fainting HPI: The patient with past medical history of bipolar disorder as well as Crohn's disease presents to the emergency department reporting a loss of consciousness that was preceded by feeling as if he may "space out".  The patient reports that he has been under a lot of stress and that this may have caused the symptoms.  He also reports some vague right-sided weakness.  The patient also admits to some chest pain that seem to have resolved by the time of the evaluation in the emergency department.  He denies shortness of breath, nausea, vomiting or diaphoresis.  Though the patient did not appear to have any evidence of seizure activity in the emergency department, tele-neurology was consulted who recommended seizure disorder work-up which prompted the emergency department staff call hospitalist service for admission.  Past Medical History:  Diagnosis Date  . Anxiety   . Bipolar 1 disorder ([place])   . Crohn disease ([place])   . Depression   . Renal disorder    kidney stones    Past Surgical History:  Procedure Laterality Date  . CHOLECYSTECTOMY      History reviewed. No pertinent family history.  Patient will not contribute to family history  Social History:  reports that he has been smoking cigarettes. He has a 15.00 pack-year smoking history. He has never used smokeless tobacco. He reports that he does not drink alcohol or use drugs.  Allergies:  Allergies  Allergen Reactions  . Nsaids Swelling    Chrons- swell up on inside per pt    Medications Prior to Admission  Medication Sig Dispense Refill  . atorvastatin (LIPITOR) 10 MG tablet Take 10 mg by mouth every evening.    . cyclobenzaprine (FLEXERIL) 10 MG tablet Take 1 tablet (10 mg total) by mouth 3 (three) times daily. 90 tablet 0  . gabapentin (NEURONTIN) 300 MG capsule Take 2 capsules (600 mg total) by mouth 3 (three) times daily. 180 capsule 1  . haloperidol  (HALDOL) 5 MG tablet Take 5-10 mg by mouth Nightly. Take one tablet in the morning, one tablet in the afternoon and two tablets at bedtime    . hydrOXYzine (ATARAX/VISTARIL) 50 MG tablet Take 1 tablet (50 mg total) by mouth 3 (three) times daily as needed for anxiety. 90 tablet 1  . lithium carbonate (ESKALITH) 450 MG CR tablet Take 450 mg by mouth every 12 (twelve) hours.    Marland Kitchen omeprazole (PRILOSEC) 20 MG capsule Take 1 capsule (20 mg total) by mouth daily. 30 capsule 0  . perphenazine (TRILAFON) 4 MG tablet Take 4-8 mg by mouth 3 (three) times daily.    . traZODone (DESYREL) 100 MG tablet Take 2 tablets (200 mg total) by mouth at bedtime. 60 tablet 1  . zolpidem (AMBIEN) 10 MG tablet Take 1 tablet (10 mg total) by mouth at bedtime as needed for sleep. 30 tablet 0    Results for orders placed or performed during the hospital encounter of 09/04/18 (from the past 48 hour(s))  Glucose, capillary     Status: Abnormal   Collection Time: 09/04/18  5:11 PM  Result Value Ref Range   Glucose-Capillary 111 (H) 70 - 99 mg/dL  CBC     Status: Abnormal   Collection Time: 09/04/18  5:14 PM  Result Value Ref Range   WBC 11.2 (H) 4.0 - 10.5 K/uL   RBC 4.83 4.22 - 5.81 MIL/uL   Hemoglobin 14.9 13.0 -  17.0 g/dL   HCT 43.5 39.0 - 52.0 %   MCV 90.1 80.0 - 100.0 fL   MCH 30.8 26.0 - 34.0 pg   MCHC 34.3 30.0 - 36.0 g/dL   RDW 12.4 11.5 - 15.5 %   Platelets 233 150 - 400 K/uL   nRBC 0.0 0.0 - 0.2 %    Comment: Performed at [facility], [address] [place], [place] [address]  Protime-INR     Status: None   Collection Time: 09/04/18  5:14 PM  Result Value Ref Range   Prothrombin Time 12.2 11.4 - 15.2 seconds   INR 0.91     Comment: Performed at [facility], [address] [place], [place] [address]  APTT     Status: None   Collection Time: 09/04/18  5:14 PM  Result Value Ref Range   aPTT 31 24 - 36 seconds    Comment: Performed at [facility], [address]  [place], [place] [address]  Differential     Status: Abnormal   Collection Time: 09/04/18  5:14 PM  Result Value Ref Range   Neutrophils Relative % 53 %   Neutro Abs 5.9 1.7 - 7.7 K/uL   Lymphocytes Relative 38 %   Lymphs Abs 4.2 (H) 0.7 - 4.0 K/uL   Monocytes Relative 6 %   Monocytes Absolute 0.7 0.1 - 1.0 K/uL   Eosinophils Relative 3 %   Eosinophils Absolute 0.4 0.0 - 0.5 K/uL   Basophils Relative 0 %   Basophils Absolute 0.1 0.0 - 0.1 K/uL   Immature Granulocytes 0 %   Abs Immature Granulocytes 0.05 0.00 - 0.07 K/uL    Comment: Performed at [facility], [address] [place], [place] [address]  Comprehensive metabolic panel     Status: Abnormal   Collection Time: 09/04/18  5:14 PM  Result Value Ref Range   Sodium 138 135 - 145 mmol/L   Potassium 3.7 3.5 - 5.1 mmol/L   Chloride 105 98 - 111 mmol/L   CO2 26 22 - 32 mmol/L   Glucose, Bld 126 (H) 70 - 99 mg/dL   BUN 10 6 - 20 mg/dL   Creatinine, Ser 0.99 0.61 - 1.24 mg/dL   Calcium 9.2 8.9 - 10.3 mg/dL   Total Protein 6.9 6.5 - 8.1 g/dL   Albumin 3.7 3.5 - 5.0 g/dL   AST 34 15 - 41 U/L   ALT 49 (H) 0 - 44 U/L   Alkaline Phosphatase 78 38 - 126 U/L   Total Bilirubin 0.4 0.3 - 1.2 mg/dL   GFR calc non Af Amer >60 >60 mL/min   GFR calc Af Amer >60 >60 mL/min   Anion gap 7 5 - 15    Comment: Performed at [facility], [address] [place], [place] [address]  Troponin I - Once     Status: None   Collection Time: 09/04/18  5:14 PM  Result Value Ref Range   Troponin I <0.03 <0.03 ng/mL    Comment: Performed at [facility], [address] [place], [place] [address]  Lithium level     Status: Abnormal   Collection Time: 09/04/18  5:14 PM  Result Value Ref Range   Lithium Lvl 0.22 (L) 0.60 - 1.20 mmol/L    Comment: Performed at [facility], [address], [place], [place] [address]  TSH     Status: None   Collection Time: 09/04/18  5:14 PM  Result Value  Ref Range   TSH 1.811 0.350 -  4.500 uIU/mL    Comment: Performed by a 3rd Generation assay with a functional sensitivity of <=0.01 uIU/mL. Performed at [facility], [address] [place], [place] [address]   Ethanol     Status: None   Collection Time: 09/04/18  7:59 PM  Result Value Ref Range   Alcohol, Ethyl (B) <10 <10 mg/dL    Comment: (NOTE) Lowest detectable limit for serum alcohol is 10 mg/dL. For medical purposes only. Performed at [facility], [address] [place], [place] [address]   Urinalysis, Complete w Microscopic     Status: Abnormal   Collection Time: 09/04/18 10:05 PM  Result Value Ref Range   Color, Urine YELLOW (A) YELLOW   APPearance CLEAR (A) CLEAR   Specific Gravity, Urine 1.011 1.005 - 1.030   pH 6.0 5.0 - 8.0   Glucose, UA NEGATIVE NEGATIVE mg/dL   Hgb urine dipstick NEGATIVE NEGATIVE   Bilirubin Urine NEGATIVE NEGATIVE   Ketones, ur NEGATIVE NEGATIVE mg/dL   Protein, ur NEGATIVE NEGATIVE mg/dL   Nitrite NEGATIVE NEGATIVE   Leukocytes, UA NEGATIVE NEGATIVE   RBC / HPF 0-5 0 - 5 RBC/hpf   WBC, UA 0-5 0 - 5 WBC/hpf   Bacteria, UA NONE SEEN NONE SEEN   Squamous Epithelial / LPF NONE SEEN 0 - 5   Mucus PRESENT     Comment: Performed at [facility], [address] [place], [place] [address]  Urine Drug Screen, Qualitative ([facility] only)     Status: Abnormal   Collection Time: 09/04/18 10:05 PM  Result Value Ref Range   Tricyclic, Ur Screen POSITIVE (A) NONE DETECTED   Amphetamines, Ur Screen POSITIVE (A) NONE DETECTED   MDMA (Ecstasy)Ur Screen NONE DETECTED NONE DETECTED   Cocaine Metabolite,Ur [place] POSITIVE (A) NONE DETECTED   Opiate, Ur Screen NONE DETECTED NONE DETECTED   Phencyclidine (PCP) Ur S NONE DETECTED NONE DETECTED   Cannabinoid 50 Ng, Ur [place] NONE DETECTED NONE DETECTED   Barbiturates, Ur Screen NONE DETECTED NONE DETECTED   Benzodiazepine, Ur Scrn POSITIVE (A) NONE DETECTED   Methadone Scn, Ur NONE DETECTED NONE DETECTED    Comment:  (NOTE) Tricyclics + metabolites, urine    Cutoff 1000 ng/mL Amphetamines + metabolites, urine  Cutoff 1000 ng/mL MDMA (Ecstasy), urine              Cutoff 500 ng/mL Cocaine Metabolite, urine          Cutoff 300 ng/mL Opiate + metabolites, urine        Cutoff 300 ng/mL Phencyclidine (PCP), urine         Cutoff 25 ng/mL Cannabinoid, urine                 Cutoff 50 ng/mL Barbiturates + metabolites, urine  Cutoff 200 ng/mL Benzodiazepine, urine              Cutoff 200 ng/mL Methadone, urine                   Cutoff 300 ng/mL The urine drug screen provides only a preliminary, unconfirmed analytical test result and should not be used for non-medical purposes. Clinical consideration and professional judgment should be applied to any positive drug screen result due to possible interfering substances. A more specific alternate chemical method must be used in order to obtain a confirmed analytical result. Gas chromatography / mass spectrometry (GC/MS) is the preferred confirmat ory method. Performed  at [facility], [address] [place], [place] [address]    Dg Chest 2 View  Result Date: 09/04/2018 CLINICAL DATA:  28 year old male with syncopal episodes, double vision, altered mental status. EXAM: CHEST - 2 VIEW COMPARISON:  10/27/2017 and earlier. FINDINGS: Seated AP and lateral views of the chest. Low normal lung volumes. Normal cardiac size and mediastinal contours. Visualized tracheal air column is within normal limits. Both lungs appear clear. No pneumothorax or pleural effusion. Negative visible bowel gas pattern. No acute osseous abnormality identified, but there is age advanced lower thoracic spine endplate spurring. IMPRESSION: Negative.  No acute cardiopulmonary abnormality. Electronically Signed   By: Genevie Ann M.D.   On: 09/04/2018 20:15   Ct Head Wo Contrast  Result Date: 09/04/2018 CLINICAL DATA:  28 y/o M; double vision and episodes of syncope since Saturday. EXAM: CT HEAD  WITHOUT CONTRAST TECHNIQUE: Contiguous axial images were obtained from the base of the skull through the vertex without intravenous contrast. COMPARISON:  None. FINDINGS: Brain: No evidence of acute infarction, hemorrhage, hydrocephalus, extra-axial collection or mass lesion/mass effect. Vascular: No hyperdense vessel or unexpected calcification. Skull: Normal. Negative for fracture or focal lesion. Sinuses/Orbits: No acute finding. Other: None. IMPRESSION: No acute intracranial abnormality identified. Unremarkable CT of the head. Electronically Signed   By: Kristine Garbe M.D.   On: 09/04/2018 17:30   Mr Brain Wo Contrast  Result Date: 09/04/2018 CLINICAL DATA:  28 y/o M; episodes of altered mental status, blurred vision, right sided weakness. EXAM: MRI HEAD WITHOUT CONTRAST TECHNIQUE: Multiplanar, multiecho pulse sequences of the brain and surrounding structures were obtained without intravenous contrast. COMPARISON:  09/04/2018 CT head FINDINGS: Brain: No acute infarction, hemorrhage, hydrocephalus, extra-axial collection or mass lesion. No structural or signal abnormality of the brain identified. Vascular: Normal flow voids. Skull and upper cervical spine: Normal marrow signal. Sinuses/Orbits: Mild right anterior ethmoid air cell and left maxillary sinus mucosal thickening. Small right maxillary sinus mucous retention cyst. No abnormal signal of mastoid air cells. Orbits are unremarkable. Other: None. IMPRESSION: Normal MRI of the brain. Electronically Signed   By: Kristine Garbe M.D.   On: 09/04/2018 22:30    Review of Systems  Constitutional: Negative for chills and fever.  HENT: Negative for sore throat and tinnitus.   Eyes: Negative for blurred vision and redness.  Respiratory: Negative for cough and shortness of breath.   Cardiovascular: Negative for chest pain, palpitations, orthopnea and PND.  Gastrointestinal: Negative for abdominal pain, diarrhea, nausea and vomiting.   Genitourinary: Negative for dysuria, frequency and urgency.  Musculoskeletal: Negative for joint pain and myalgias.  Skin: Negative for rash.       No lesions  Neurological: Negative for speech change, focal weakness and weakness.  Endo/Heme/Allergies: Does not bruise/bleed easily.       No temperature intolerance  Psychiatric/Behavioral: Negative for depression and suicidal ideas.    Blood pressure 128/84, pulse 85, temperature 98.4 F (36.9 C), temperature source Oral, resp. rate 16, height 6' (1.829 m), weight 135.9 kg, SpO2 94 %. Physical Exam  Vitals reviewed. Constitutional: He appears well-developed and well-nourished. No distress.  HENT:  Head: Normocephalic and atraumatic.  Mouth/Throat: Oropharynx is clear and moist.  Eyes: Pupils are equal, round, and reactive to light. Conjunctivae and EOM are normal. No scleral icterus.  Neck: Normal range of motion. Neck supple. No JVD present. No tracheal deviation present. No thyromegaly present.  Cardiovascular: Normal rate, regular rhythm and normal heart sounds. Exam reveals no gallop  and no friction rub.  No murmur heard. Respiratory: Effort normal and breath sounds normal. No respiratory distress.  GI: Soft. Bowel sounds are normal. He exhibits no distension. There is no abdominal tenderness.  Lymphadenopathy:    He has no cervical adenopathy.  Skin: Skin is warm and dry. No rash noted. No erythema.  Psychiatric: Judgment and thought content normal.  Affect is odd.  The patient's behavior varies between challenging/aggressive and docile     Assessment/Plan This is a 28 year old male admitted for seizure-like activity. 1.  Seizure activity: Unlikely as the patient has not had seizure disorder in the past.  If he were to have a seizure syndrome is symptoms are more consistent with absence seizures but his positive urine toxicology screen is a confounding factor and reliability of symptoms.  Seizure precautions.  Consult  neurology. 2.  Polysubstance abuse: Urine tox screen positive for cocaine, methamphetamines as well as benzos.  Monitor for behavioral health issues 3.  Bipolar disorder: Contributing to above symptoms/history.  Continue lithium as well as Haldol and perphenazine and trazodone 4.  Hyperlipidemia: Likely secondary to antipsychotic medications. Continue statin therapy.   5.  DVT prophylaxis: Lovenox 6.  GI prophylaxis: Pantoprazole per home regimen The patient is a full code.  Time spent on admission orders and patient care approximately 45 minutes  Harrie Foreman, MD 09/05/2018, 8:24 AM

## 2018-11-23 IMAGING — CR DG CHEST 2V
2 series · 2 of 2 positions shown · non-contrast
Comparison: 11/22/2014

CLINICAL DATA: Smoker, acute chest pain

EXAM:
CHEST - 2 VIEW

[chest pa]
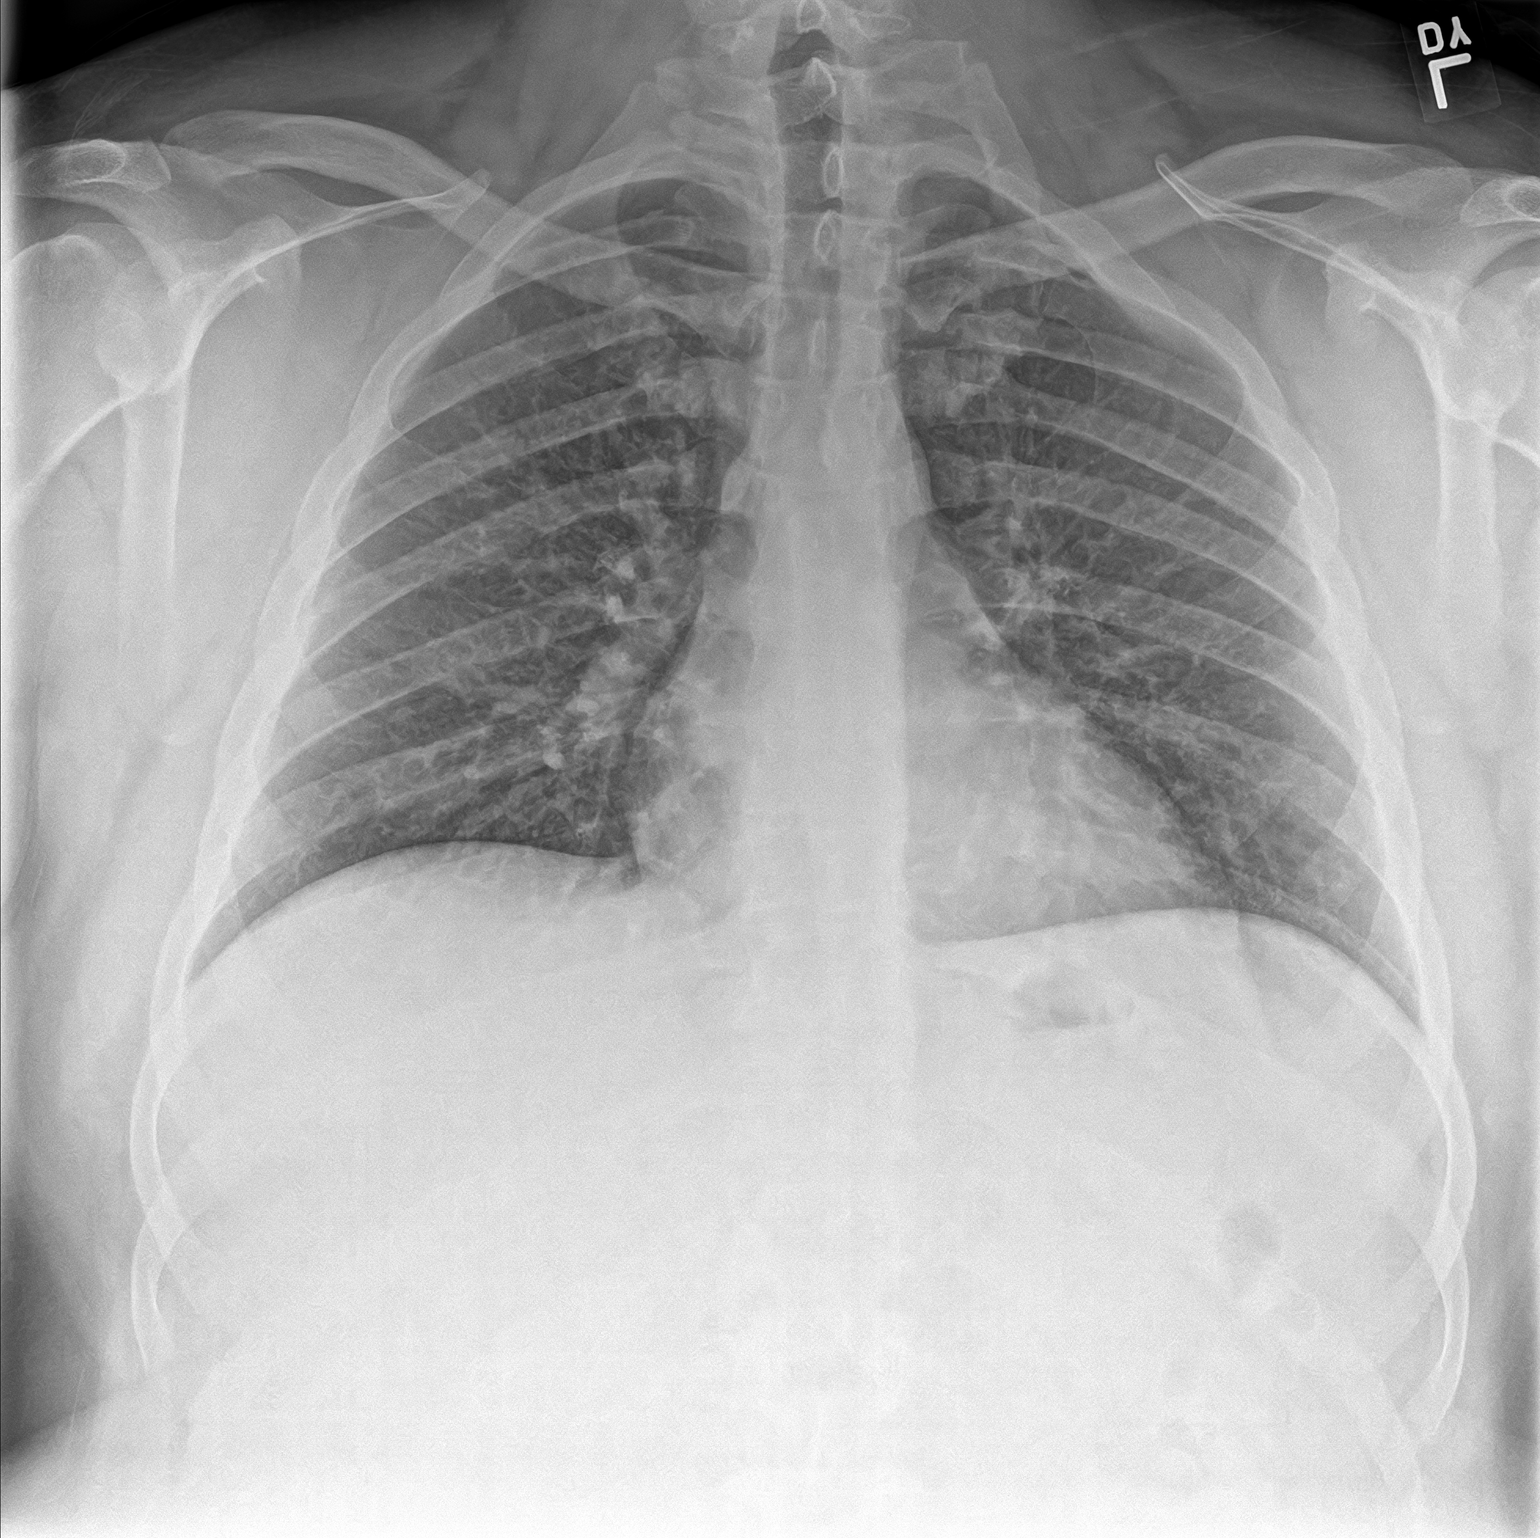

[chest lat]
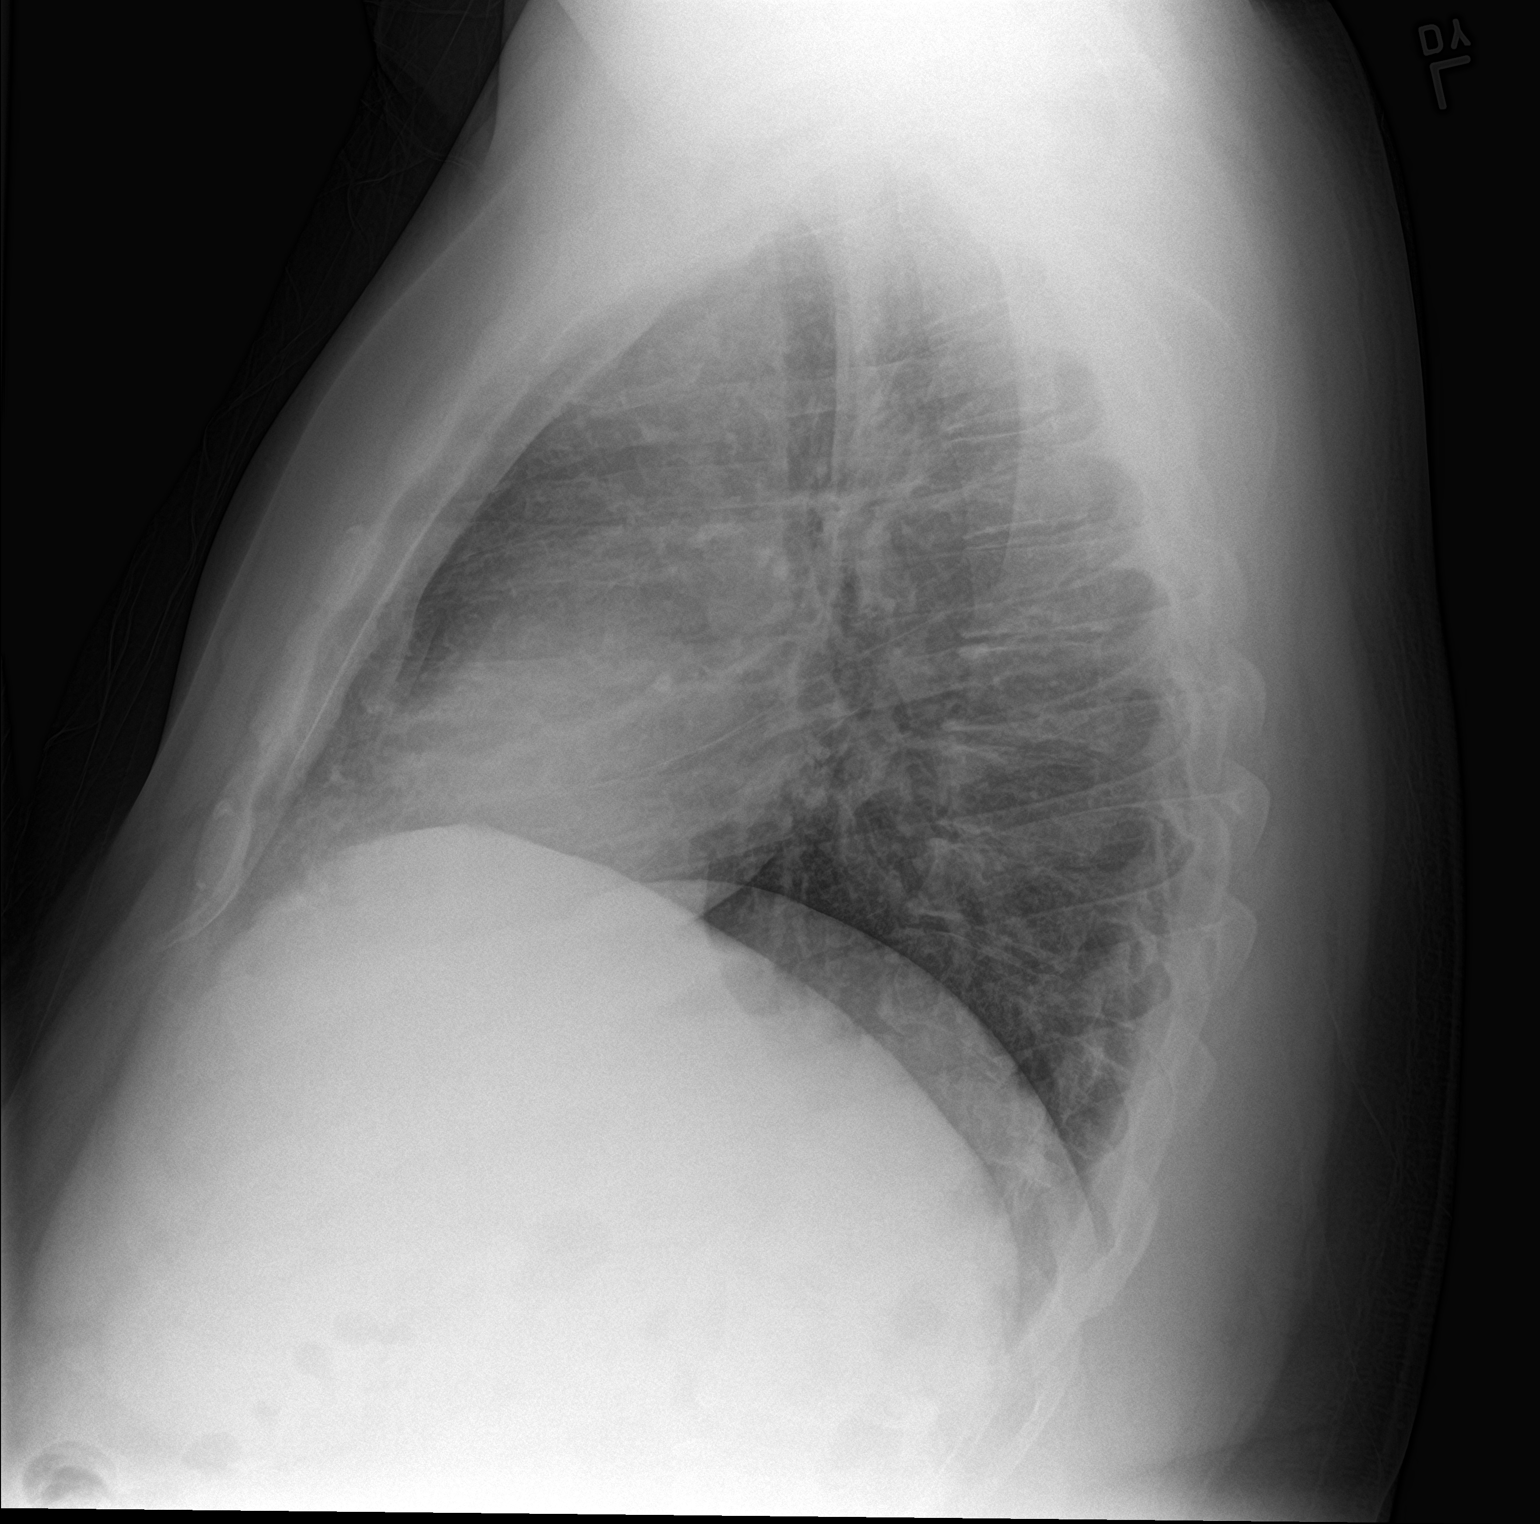

[2 of 2 positions shown; findings below may reference images not displayed]

FINDINGS: The heart size and mediastinal contours are within normal limits.
Both lungs are clear. The visualized skeletal structures are
unremarkable. Stable congenital rib abnormality of the left first
and second ribs as before. Trachea is midline. Normal heart size and
vascularity.
IMPRESSION: No active cardiopulmonary disease.

## 2019-10-01 IMAGING — MR MR HEAD W/O CM
9 of 10 series · 43 of 48 positions shown · non-contrast
Comparison: 09/04/2018 CT head

CLINICAL DATA: 28 y/o M; episodes of altered mental status, blurred
vision, right sided weakness.

EXAM:
MRI HEAD WITHOUT CONTRAST
TECHNIQUE: Multiplanar, multiecho pulse sequences of the brain and surrounding
structures were obtained without intravenous contrast.

[Series 2: ax dwi_tracew · axial · 3.0mm · 0.83mm/px · z∈[+3,+157]mm · 6 of 55 slices shown]
[im 1/55]
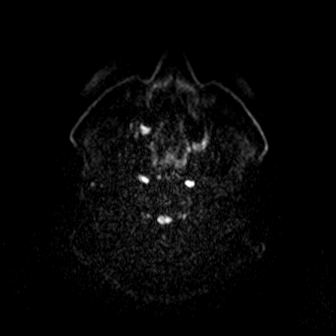
[im 11/55]
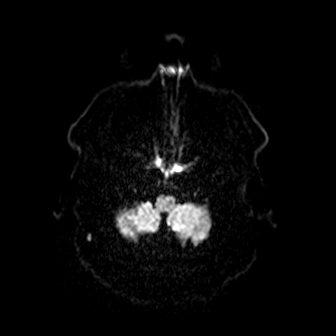
[im 22/55]
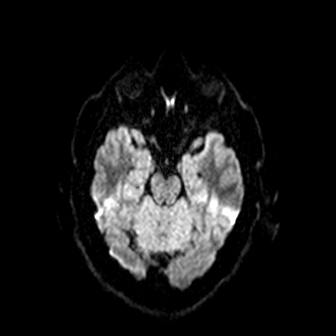
[im 33/55]
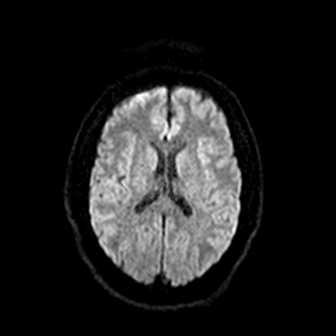
[im 44/55]
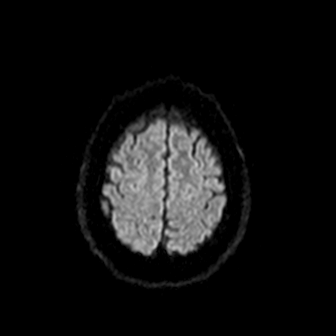
[im 55/55]
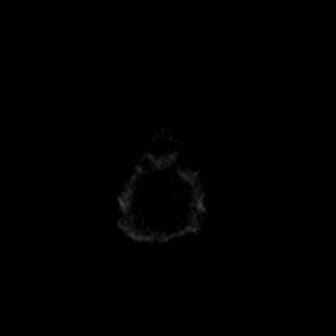

[Series 3: ax dwi_adc · axial · 3.0mm · 0.83mm/px · z∈[+3,+157]mm · 7 of 55 slices shown]
[im 1/55]
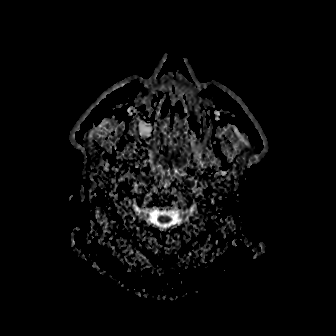
[im 10/55]
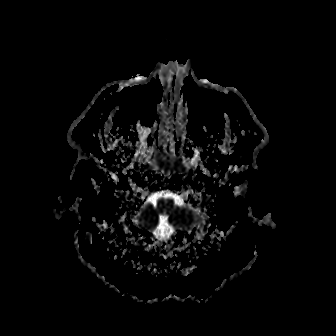
[im 19/55]
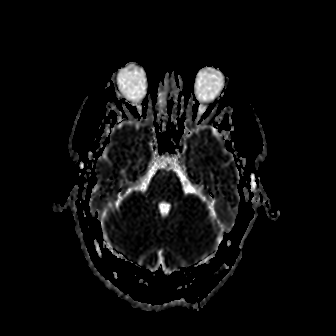
[im 28/55]
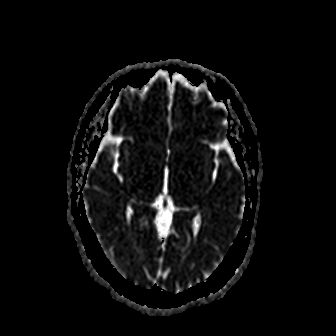
[im 37/55]
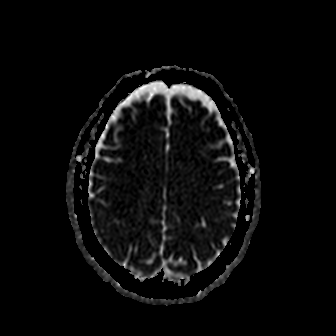
[im 46/55]
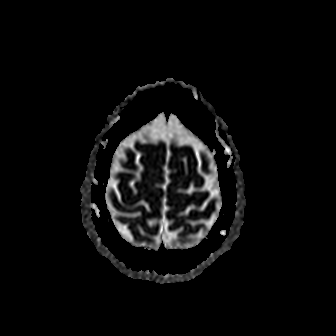
[im 55/55]
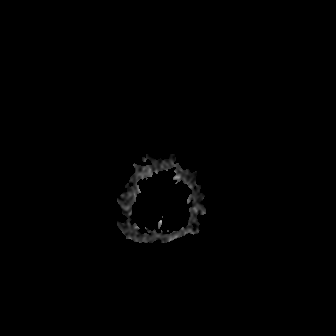

[Series 4: cor dwi_tracew · coronal · 5.0mm · 0.68mm/px · 6 of 41 slices shown]
[im 1/41]
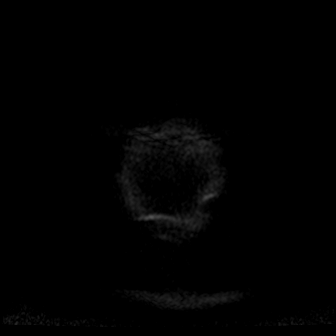
[im 9/41]
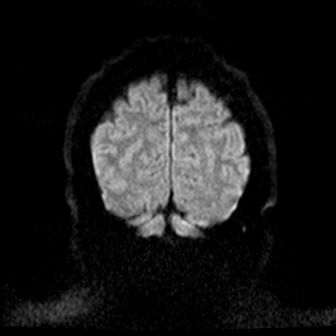
[im 17/41]
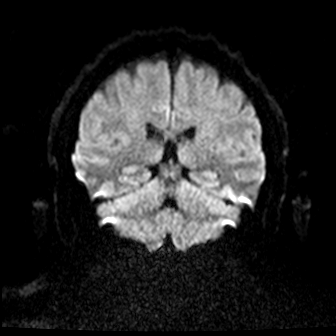
[im 25/41]
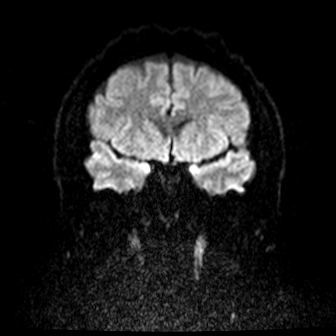
[im 33/41]
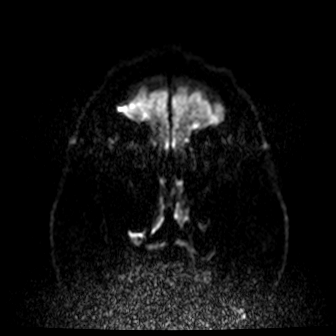
[im 41/41]
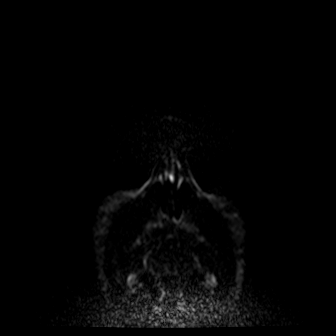

[Series 5: cor dwi_adc · coronal · 5.0mm · 0.68mm/px · 5 of 41 slices shown]
[im 1/41]
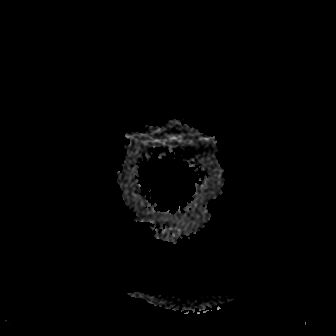
[im 9/41]
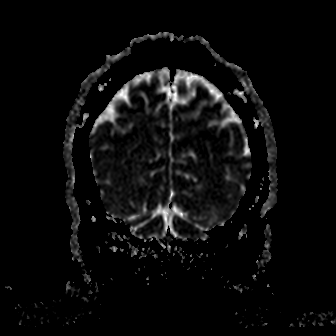
[im 17/41]
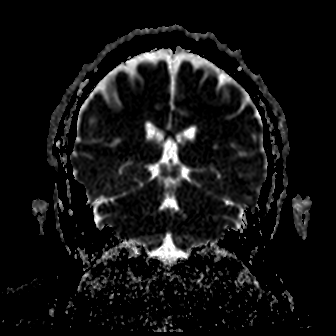
[im 25/41]
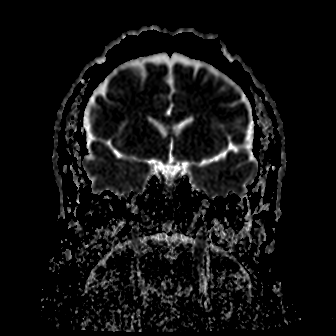
[im 33/41]
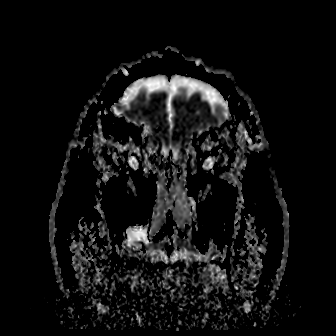

[Series 6: FLAIR · axial · 5.0mm · 1.20mm/px · z∈[-2,+146]mm · 4 of 27 slices shown]
[im 1/27]
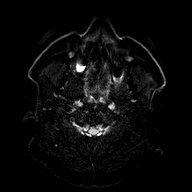
[im 9/27]
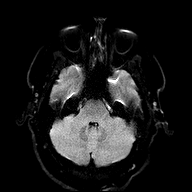
[im 18/27]
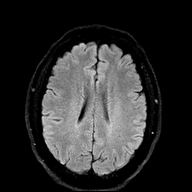
[im 27/27]
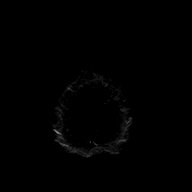

[Series 8: T2 · axial · 5.0mm · 0.45mm/px · z∈[-2,+146]mm · 4 of 27 slices shown (1 of 2)]
[im 1/27]
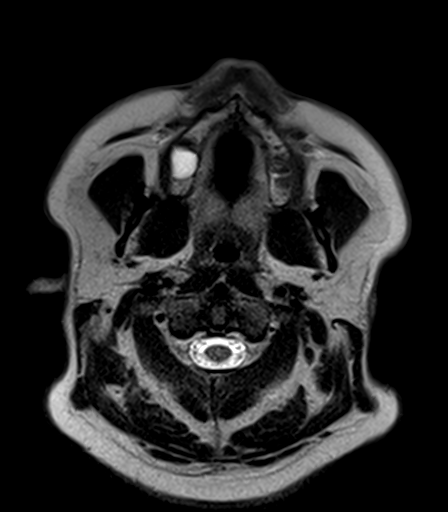
[im 9/27]
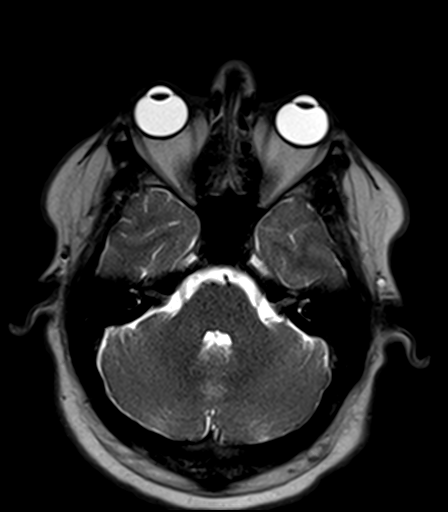
[im 18/27]
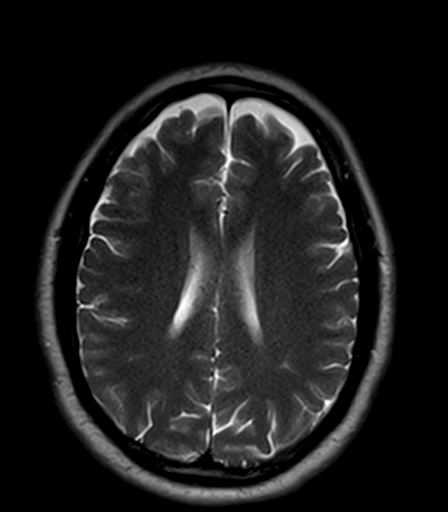
[im 27/27]
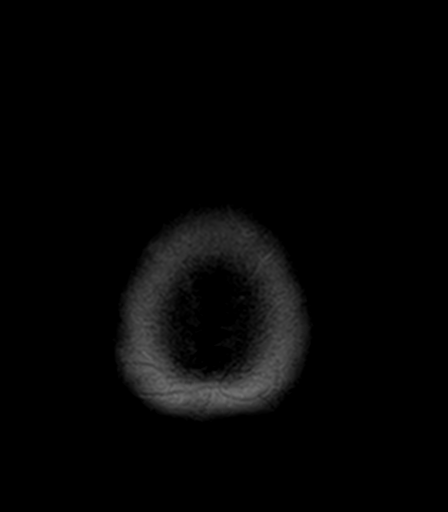

[Series 9: T1 · axial · 5.0mm · 0.90mm/px · z∈[-2,+146]mm · 4 of 27 slices shown (1 of 2)]
[im 1/27]
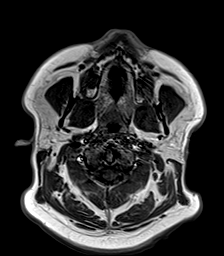
[im 9/27]
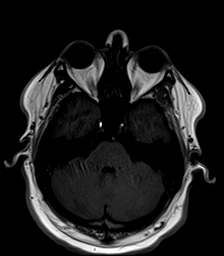
[im 18/27]
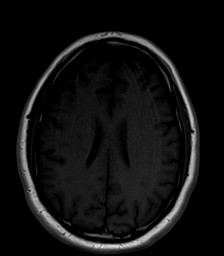
[im 27/27]
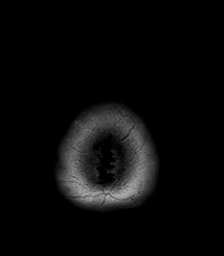

[Series 10: T1 · sagittal · 5.0mm · 0.94mm/px · 3 of 23 slices shown (2 of 2)]
[im 1/23]
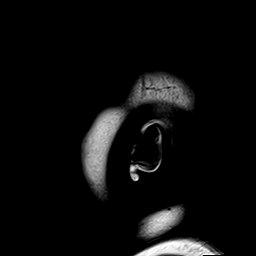
[im 12/23]
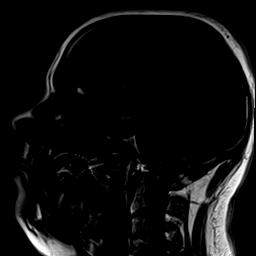
[im 23/23]
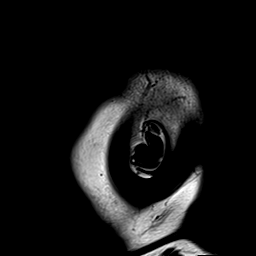

[Series 11: T2 · coronal · 5.0mm · 0.45mm/px · 4 of 33 slices shown (2 of 2)]
[im 1/33]
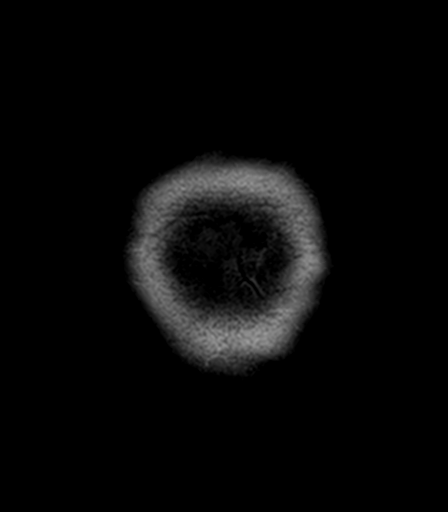
[im 11/33]
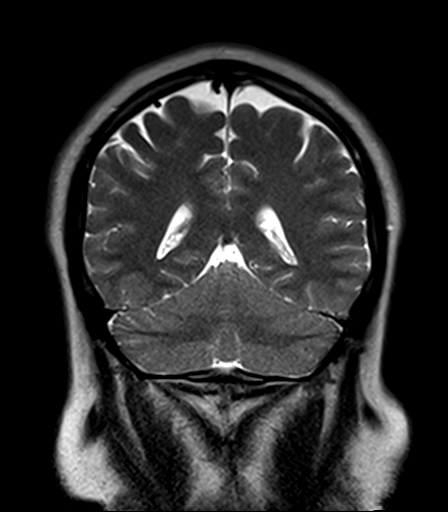
[im 22/33]
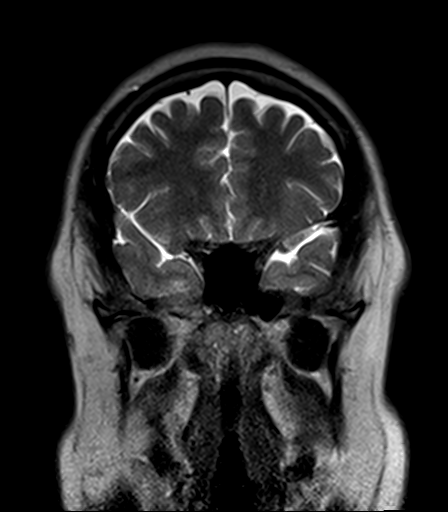
[im 33/33]
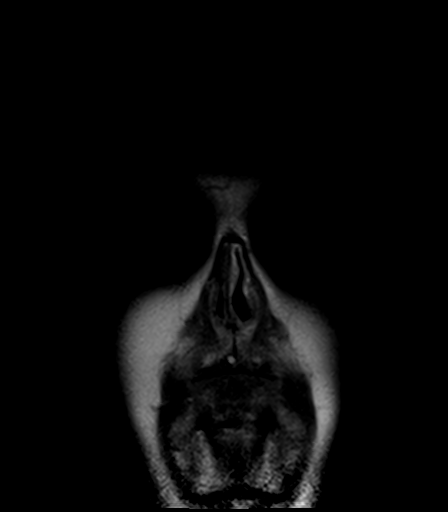

[43 of 48 positions shown; findings below may reference images not displayed]

FINDINGS: Brain: No acute infarction, hemorrhage, hydrocephalus, extra-axial
collection or mass lesion. No structural or signal abnormality of
the brain identified.

Vascular: Normal flow voids.

Skull and upper cervical spine: Normal marrow signal.

Sinuses/Orbits: Mild right anterior ethmoid air cell and left
maxillary sinus mucosal thickening. Small right maxillary sinus
mucous retention cyst. No abnormal signal of mastoid air cells.
Orbits are unremarkable.

Other: None.
IMPRESSION: Normal MRI of the brain.

## 2020-11-28 ENCOUNTER — Other Ambulatory Visit: Payer: Self-pay

## 2020-11-28 ENCOUNTER — Emergency Department
Admission: EM | Admit: 2020-11-28 | Discharge: 2020-11-28 | Disposition: A | Discharge status: Home / Self Care | Payer: Medicaid Other | Attending: Emergency Medicine | Admitting: Emergency Medicine

## 2020-11-28 ENCOUNTER — Emergency Department: Payer: Medicaid Other

## 2020-11-28 ENCOUNTER — Encounter: Payer: Self-pay | Admitting: Emergency Medicine

## 2020-11-28 DIAGNOSIS — F17210 Nicotine dependence, cigarettes, uncomplicated: Secondary | ICD-10-CM | POA: Diagnosis not present

## 2020-11-28 DIAGNOSIS — S99912A Unspecified injury of left ankle, initial encounter: Secondary | ICD-10-CM | POA: Diagnosis present

## 2020-11-28 DIAGNOSIS — S93402A Sprain of unspecified ligament of left ankle, initial encounter: Secondary | ICD-10-CM | POA: Insufficient documentation

## 2020-11-28 DIAGNOSIS — Y92828 Other wilderness area as the place of occurrence of the external cause: Secondary | ICD-10-CM | POA: Diagnosis not present

## 2020-11-28 MED ORDER — PREDNISONE 10 MG PO TABS: 10.0000 mg | ORAL_TABLET | ORAL | 0 refills | Status: DC

## 2020-11-28 MED ORDER — DEXAMETHASONE SODIUM PHOSPHATE 10 MG/ML IJ SOLN
10.0000 mg | Freq: Once | INTRAMUSCULAR | Status: AC
  Administered 2020-11-28: 10 mg via INTRAMUSCULAR
  Filled 2020-11-28: qty 1

## 2020-11-28 NOTE — ED Notes (Signed)
See triage note  Presents with pain to left ankle s/p fall    Ankle is swollen and tender  Good pulses

## 2020-11-28 NOTE — ED Triage Notes (Signed)
Pt with c/o L ankle pain and swelling at this time. Pt states fell, pt with noted L ankle swelling at this time.

## 2020-11-28 NOTE — ED Provider Notes (Signed)
[facility] Emergency Department Provider Note  ____________________________________________  Time seen: Approximately 3:35 PM  I have reviewed the triage vital signs and the nursing notes.   HISTORY  Chief Complaint Ankle Pain    HPI Carl Martinez is a 30 y.o. male who presents the emergency department complaining of left ankle pain.  Patient states that he has a history of repetitive ankle sprains/injuries due to playing sports.  Patient was coming down a hill when he believes he rolled his ankle 2 days ago.  Initially he was able to stand, ambulate but the following day, yesterday, patient has had difficulty moving or bearing weight with the right ankle.   Patient did not sustain any other injuries.  Patient is endorsing global ankle pain at this time.        Past Medical History:  Diagnosis Date  . Anxiety   . Bipolar 1 disorder ([place])   . Crohn disease ([place])   . Depression   . Renal disorder    kidney stones    Patient Active Problem List   Diagnosis Date Noted  . Seizure-like activity ([place]) 09/05/2018  . Non-compliance with treatment 08/07/2017  . Schizoaffective disorder, bipolar type ([place]) 04/24/2017  . MDD (major depressive disorder), recurrent episode, moderate ([place]) 12/26/2016  . Chronic back pain 12/25/2016  . PTSD (post-traumatic stress disorder) 12/18/2016  . GERD (gastroesophageal reflux disease) 12/18/2016  . Crohn's disease ([place]) 12/12/2016  . Tobacco use disorder 04/05/2016    Past Surgical History:  Procedure Laterality Date  . CHOLECYSTECTOMY      Prior to Admission medications   Medication Sig Start Date End Date Taking? Authorizing Provider  predniSONE (DELTASONE) 10 MG tablet Take 1 tablet (10 mg total) by mouth as directed. 11/28/20  Yes Leanord Thibeau, Charline Bills, PA-C  atorvastatin (LIPITOR) 10 MG tablet Take 10 mg by mouth every evening. 05/15/18   [provider]  cyclobenzaprine (FLEXERIL) 10 MG tablet  Take 1 tablet (10 mg total) by mouth 3 (three) times daily. 12/18/16   Pucilowska, Herma Ard B, MD  gabapentin (NEURONTIN) 300 MG capsule Take 2 capsules (600 mg total) by mouth 3 (three) times daily. 04/29/17   Pucilowska, Jolanta B, MD  haloperidol (HALDOL) 5 MG tablet Take 5-10 mg by mouth Nightly. Take one tablet in the morning, one tablet in the afternoon and two tablets at bedtime    [provider]  hydrOXYzine (ATARAX/VISTARIL) 50 MG tablet Take 1 tablet (50 mg total) by mouth 3 (three) times daily as needed for anxiety. 04/29/17   Pucilowska, Herma Ard B, MD  lithium carbonate (ESKALITH) 450 MG CR tablet Take 450 mg by mouth every 12 (twelve) hours. 05/15/18   [provider]  omeprazole (PRILOSEC) 20 MG capsule Take 1 capsule (20 mg total) by mouth daily. 12/18/16   Pucilowska, Jolanta B, MD  perphenazine (TRILAFON) 4 MG tablet Take 4-8 mg by mouth 3 (three) times daily. 05/15/18   [provider]  traZODone (DESYREL) 100 MG tablet Take 2 tablets (200 mg total) by mouth at bedtime. 04/29/17   Pucilowska, Herma Ard B, MD  valproic acid (DEPAKENE) 250 MG capsule Take 2 capsules (500 mg total) by mouth 2 (two) times daily. 09/05/18   Mayo, Pete Pelt, MD  zolpidem (AMBIEN) 10 MG tablet Take 1 tablet (10 mg total) by mouth at bedtime as needed for sleep. 04/29/17   Pucilowska, Wardell Honour, MD    Allergies Nsaids  History reviewed. No pertinent family history.  Social History Social  History   Tobacco Use  . Smoking status: Current Every Day Smoker    Packs/day: 1.50    Years: 10.00    Pack years: 15.00    Types: Cigarettes  . Smokeless tobacco: Never Used  Substance Use Topics  . Alcohol use: No  . Drug use: No     Review of Systems  Constitutional: No fever/chills Eyes: No visual changes. No discharge ENT: No upper respiratory complaints. Cardiovascular: no chest pain. Respiratory: no cough. No SOB. Gastrointestinal: No abdominal pain.  No nausea, no vomiting.   No diarrhea.  No constipation. Musculoskeletal: Positive for L ankle injury and pain Skin: Negative for rash, abrasions, lacerations, ecchymosis. Neurological: Negative for headaches, focal weakness or numbness.  10 System ROS otherwise negative.  ____________________________________________   PHYSICAL EXAM:  VITAL SIGNS: ED Triage Vitals [11/28/20 1431]  Enc Vitals Group     BP (!) 198/145     Pulse Rate 89     Resp 18     Temp 98.5 F (36.9 C)     Temp Source Oral     SpO2 100 %     Weight 255 lb (115.7 kg)     Height 6' 1"  (1.854 m)     Head Circumference      Peak Flow      Pain Score 9     Pain Loc      Pain Edu?      Excl. in [place]?      Constitutional: Alert and oriented. Well appearing and in no acute distress. Eyes: Conjunctivae are normal. PERRL. EOMI. Head: Atraumatic. ENT:      Ears:       Nose: No congestion/rhinnorhea.      Mouth/Throat: Mucous membranes are moist.  Neck: No stridor.    Cardiovascular: Normal rate, regular rhythm. Normal S1 and S2.  Good peripheral circulation. Respiratory: Normal respiratory effort without tachypnea or retractions. Lungs CTAB. Good air entry to the bases with no decreased or absent breath sounds. Musculoskeletal: Full range of motion to all extremities. No gross deformities appreciated.  Gross edema noted to the left ankle.  This was globally identified without areas of significant ecchymosis or open wounds.  Limited range of motion due to pain.  Patient is globally tender along the posterior, bilateral aspects and anterior joint line.  No extension into the foot with tenderness.  Edema does extend into the foot. Neurologic:  Normal speech and language. No gross focal neurologic deficits are appreciated.  Skin:  Skin is warm, dry and intact. No rash noted. Psychiatric: Mood and affect are normal. Speech and behavior are normal. Patient exhibits appropriate insight and  judgement.   ____________________________________________   LABS (all labs ordered are listed, but only abnormal results are displayed)  Labs Reviewed - No data to display ____________________________________________  EKG   ____________________________________________  RADIOLOGY I personally viewed and evaluated these images as part of my medical decision making, as well as reviewing the written report by the radiologist.  ED Provider Interpretation: Well-corticated osseous fragments adjacent to the medial malleolus consistent with remote injury.  No evidence of acute fracture.  No evidence of widening of the ankle mortise.  DG Ankle Complete Left  Result Date: 11/28/2020 CLINICAL DATA:  Swelling EXAM: LEFT ANKLE COMPLETE - 3+ VIEW COMPARISON:  None. FINDINGS: There is soft tissue swelling about the ankle, most notably about the medial malleolus. There well corticated osseous fragments adjacent to the medial malleolus, likely representing sequela of an old remote  injury. There is no definite acute displaced fracture or dislocation identified on today's study. There is a moderate size plantar calcaneal spur. IMPRESSION: Soft tissue swelling about the ankle without evidence for an acute displaced fracture or dislocation. Electronically Signed   By: Constance Holster M.D.   On: 11/28/2020 15:22    ____________________________________________    PROCEDURES  Procedure(s) performed:    Procedures    Medications  dexamethasone (DECADRON) injection 10 mg (has no administration in time range)         ____________________________________________   INITIAL IMPRESSION / ASSESSMENT AND PLAN / ED COURSE  Pertinent labs & imaging results that were available during my care of the patient were reviewed by me and considered in my medical decision making (see chart for details).  Review of the [place] CSRS was performed in accordance of the [place] prior to dispensing any controlled  drugs.           Patient's diagnosis is consistent with left ankle sprain.  Patient presented to emergency department after rolling his ankle 2 days ago.  Initially patient was able to ambulate but it had increased pain and swelling yesterday.  Patient was unable to tolerate walking today.  Differential includes fracture versus dislocation versus sprain.  Imaging reveals no acute fracture or dislocation.  Findings are consistent with ankle sprain at this time.  Patient is given crutches, ASO lace up stirrup ankle brace for support.  Patient cannot take NSAIDs due to his Crohn's status and as such will be placed on a course of steroids for inflammation control.  Patient may also take Tylenol at home.  Follow-up with orthopedics if symptoms or not improving with conservative measures. Patient is given ED precautions to return to the ED for any worsening or new symptoms.     ____________________________________________  FINAL CLINICAL IMPRESSION(S) / ED DIAGNOSES  Final diagnoses:  Sprain of left ankle, unspecified ligament, initial encounter      NEW MEDICATIONS STARTED DURING THIS VISIT:  ED Discharge Orders         Ordered    predniSONE (DELTASONE) 10 MG tablet  As directed       Note to Pharmacy: Take on a pattern of 6, 6, 5, 5, 4, 4, 3, 3, 2, 2, 1, 1   11/28/20 1559              This chart was dictated using voice recognition software/Dragon. Despite best efforts to proofread, errors can occur which can change the meaning. Any change was purely unintentional.    Darletta Moll, PA-C 11/28/20 1559    Naaman Plummer, MD 11/28/20 [phone]

## 2021-01-31 ENCOUNTER — Other Ambulatory Visit: Payer: Self-pay

## 2021-01-31 ENCOUNTER — Emergency Department
Admission: EM | Admit: 2021-01-31 | Discharge: 2021-01-31 | Disposition: A | Discharge status: Home / Self Care | Payer: Medicaid Other | Attending: Emergency Medicine | Admitting: Emergency Medicine

## 2021-01-31 DIAGNOSIS — S39012A Strain of muscle, fascia and tendon of lower back, initial encounter: Secondary | ICD-10-CM | POA: Diagnosis not present

## 2021-01-31 DIAGNOSIS — R109 Unspecified abdominal pain: Secondary | ICD-10-CM | POA: Insufficient documentation

## 2021-01-31 DIAGNOSIS — S3992XA Unspecified injury of lower back, initial encounter: Secondary | ICD-10-CM | POA: Diagnosis present

## 2021-01-31 DIAGNOSIS — F17210 Nicotine dependence, cigarettes, uncomplicated: Secondary | ICD-10-CM | POA: Diagnosis not present

## 2021-01-31 DIAGNOSIS — X58XXXA Exposure to other specified factors, initial encounter: Secondary | ICD-10-CM | POA: Insufficient documentation

## 2021-01-31 LAB — URINALYSIS, COMPLETE (UACMP) WITH MICROSCOPIC
Bilirubin Urine: NEGATIVE
Glucose, UA: NEGATIVE mg/dL
Hgb urine dipstick: NEGATIVE
Ketones, ur: NEGATIVE mg/dL
Leukocytes,Ua: NEGATIVE
Nitrite: NEGATIVE
Protein, ur: NEGATIVE mg/dL
Specific Gravity, Urine: 1.032 — ABNORMAL HIGH (ref 1.005–1.030)
pH: 5 (ref 5.0–8.0)

## 2021-01-31 MED ORDER — PREDNISONE 10 MG PO TABS: ORAL_TABLET | ORAL | 0 refills | Status: AC

## 2021-01-31 MED ORDER — HYDROCODONE-ACETAMINOPHEN 5-325 MG PO TABS: 1.0000 | ORAL_TABLET | Freq: Four times a day (QID) | ORAL | 0 refills | Status: AC | PRN

## 2021-01-31 MED ORDER — METHOCARBAMOL 500 MG PO TABS: 500.0000 mg | ORAL_TABLET | Freq: Four times a day (QID) | ORAL | 0 refills | Status: AC | PRN

## 2021-01-31 NOTE — ED Triage Notes (Signed)
Pt comes with c/o right flank pain that started this weekend. Pt states some pain with urination. Pt unsure if he pulled something or if it is a kidney stone. Pt states pain under rib cage but denies any injuries.

## 2021-01-31 NOTE — ED Notes (Signed)
See triage note   Presents with right flank pain   States pain started over the weekend  Denies any injury  Ambulates well

## 2021-01-31 NOTE — Discharge Instructions (Addendum)
Of your primary care provider if any continued problems.  Ice to your back as needed for discomfort.  A prescription for prednisone in a tapering dose for the next 6 days, methocarbamol 500 mg every 6 hours as needed for muscle spasms and hydrocodone was sent to your pharmacy.  Be aware that the methocarbamol and hydrocodone can cause drowsiness and increase your risk for injury.  Do not drive or operate machinery while taking this medication.  If additional pain medication is needed you will need to follow-up with your primary care provider.

## 2021-01-31 NOTE — ED Provider Notes (Signed)
[facility] Emergency Department Provider Note   ____________________________________________   Event Date/Time   First MD Initiated Contact with Patient 01/31/21 1225     (approximate)  I have reviewed the triage vital signs and the nursing notes.   HISTORY  Chief Complaint Flank Pain    HPI Carl Martinez is a 30 y.o. male presents to the ED with complaint of left flank pain for the last several days.  Patient states that it started over the weekend and that he is not certain whether he has a kidney stone or if he pulled a muscle.  Patient has tried some over-the-counter medication and also tried heat last evening which was not tolerated well due to his sunburn.  Patient cannot take anti-inflammatories as it interferes with his Crohn's disease.  He denies any urinary frequency or hematuria.  He denies nausea, vomiting, fever or chills.  He rates his pain as 10/10.         Past Medical History:  Diagnosis Date   Anxiety    Bipolar 1 disorder ([place])    Crohn disease ([place])    Depression    Renal disorder    kidney stones    Patient Active Problem List   Diagnosis Date Noted   Seizure-like activity ([place]) 09/05/2018   Non-compliance with treatment 08/07/2017   Schizoaffective disorder, bipolar type ([place]) 04/24/2017   MDD (major depressive disorder), recurrent episode, moderate ([place]) 12/26/2016   Chronic back pain 12/25/2016   PTSD (post-traumatic stress disorder) 12/18/2016   GERD (gastroesophageal reflux disease) 12/18/2016   Crohn's disease ([place]) 12/12/2016   Tobacco use disorder 04/05/2016    Past Surgical History:  Procedure Laterality Date   CHOLECYSTECTOMY      Prior to Admission medications   Medication Sig Start Date End Date Taking? Authorizing Provider  HYDROcodone-acetaminophen (NORCO/VICODIN) 5-325 MG tablet Take 1 tablet by mouth every 6 (six) hours as needed for moderate pain. 01/31/21 01/31/22 Yes Karlea Mckibbin L, PA-C   methocarbamol (ROBAXIN) 500 MG tablet Take 1 tablet (500 mg total) by mouth every 6 (six) hours as needed. 01/31/21  Yes Johnn Hai, PA-C  predniSONE (DELTASONE) 10 MG tablet Take 6 tablets  today, on day 2 take 5 tablets, day 3 take 4 tablets, day 4 take 3 tablets, day 5 take  2 tablets and 1 tablet the last day 01/31/21  Yes Letitia Neri L, PA-C  atorvastatin (LIPITOR) 10 MG tablet Take 10 mg by mouth every evening. 05/15/18   [provider]  gabapentin (NEURONTIN) 300 MG capsule Take 2 capsules (600 mg total) by mouth 3 (three) times daily. 04/29/17   Pucilowska, Jolanta B, MD  haloperidol (HALDOL) 5 MG tablet Take 5-10 mg by mouth Nightly. Take one tablet in the morning, one tablet in the afternoon and two tablets at bedtime    [provider]  lithium carbonate (ESKALITH) 450 MG CR tablet Take 450 mg by mouth every 12 (twelve) hours. 05/15/18   [provider]  omeprazole (PRILOSEC) 20 MG capsule Take 1 capsule (20 mg total) by mouth daily. 12/18/16   Pucilowska, Jolanta B, MD  perphenazine (TRILAFON) 4 MG tablet Take 4-8 mg by mouth 3 (three) times daily. 05/15/18   [provider]  traZODone (DESYREL) 100 MG tablet Take 2 tablets (200 mg total) by mouth at bedtime. 04/29/17   Pucilowska, Herma Ard B, MD  valproic acid (DEPAKENE) 250 MG capsule Take 2 capsules (500 mg total) by mouth 2 (two) times  daily. 09/05/18   Mayo, Pete Pelt, MD    Allergies Nsaids  No family history on file.  Social History Social History   Tobacco Use   Smoking status: Every Day    Packs/day: 1.50    Years: 10.00    Pack years: 15.00    Types: Cigarettes   Smokeless tobacco: Never  Substance Use Topics   Alcohol use: No   Drug use: No    Review of Systems Constitutional: No fever/chills Eyes: No visual changes. Cardiovascular: Denies chest pain. Respiratory: Denies shortness of breath. Gastrointestinal: No abdominal pain.  No nausea, no vomiting.  No diarrhea.    Genitourinary: Negative for dysuria. Musculoskeletal: Positive for left low back pain. Skin: Negative for rash. Neurological: Negative for headaches, focal weakness or numbness. ____________________________________________   PHYSICAL EXAM:  VITAL SIGNS: ED Triage Vitals  Enc Vitals Group     BP 01/31/21 1108 (!) 162/83     Pulse Rate 01/31/21 1108 89     Resp 01/31/21 1108 20     Temp 01/31/21 1108 97.8 F (36.6 C)     Temp Source 01/31/21 1108 Oral     SpO2 01/31/21 1108 98 %     Weight 01/31/21 1223 255 lb 1.2 oz (115.7 kg)     Height 01/31/21 1223 6' 1"  (1.854 m)     Head Circumference --      Peak Flow --      Pain Score 01/31/21 1109 10     Pain Loc --      Pain Edu? --      Excl. in [place]? --     Constitutional: Alert and oriented. Well appearing and in no acute distress. Eyes: Conjunctivae are normal. PERRL. EOMI. Head: Atraumatic. Neck: No stridor.   Cardiovascular: Normal rate, regular rhythm. Grossly normal heart sounds.  Good peripheral circulation. Respiratory: Normal respiratory effort.  No retractions. Lungs CTAB. Gastrointestinal: Soft and nontender. No distention.  Bowel sounds normoactive x4 quadrants. Musculoskeletal: No point tenderness on palpation of the thoracic or lumbar spine however on the left paravertebral muscles there is moderate tenderness and also muscles are extremely firm in comparison with the right.  Range of motion is slow and guarded secondary to discomfort and reproduction of his pain.  Good muscle strength bilaterally lower extremities.  Patient is able to stand and ambulate without any assistance.   Neurologic:  Normal speech and language. No gross focal neurologic deficits are appreciated. No gait instability. Skin:  Skin is warm, dry and intact.  First-degree sunburn trunk.  No open areas or vesicles are noted. Psychiatric: Mood and affect are normal. Speech and behavior are normal.  ____________________________________________    LABS (all labs ordered are listed, but only abnormal results are displayed)  Labs Reviewed  URINALYSIS, COMPLETE (UACMP) WITH MICROSCOPIC - Abnormal; Notable for the following components:      Result Value   Color, Urine YELLOW (*)    APPearance CLEAR (*)    Specific Gravity, Urine 1.032 (*)    Bacteria, UA RARE (*)    All other components within normal limits     PROCEDURES  Procedure(s) performed (including Critical Care):  Procedures   ____________________________________________   INITIAL IMPRESSION / ASSESSMENT AND PLAN / ED COURSE  As part of my medical decision making, I reviewed the following data within the electronic MEDICAL RECORD NUMBER Notes from prior ED visits and [place] Controlled Substance Database  30 year old male presents to the ED with complaint of left flank/back pain  that started over the weekend.  Patient states that he has had some discomfort with urination and also with movement.  He is concerned for either a pulled muscle or kidney stone.  Urinalysis was negative and on physical exam there was moderate tenderness on palpation of the left paravertebral muscles.  Patient is able to stand and ambulate without any assistance.  Good muscle strength bilaterally.  Patient has taken over-the-counter medication without any relief and is unable to take anti-inflammatories due to his Crohn's disease.  Patient was encouraged to use ice to his back rather than heat due to his sunburn.  A prescription for prednisone 6-day taper, methocarbamol 500 mg every 6 hours and hydrocodone was sent to his pharmacy.  He is aware that he cannot drive or operate machinery while taking the methocarbamol and hydrocodone.  If not improving he will follow-up with his PCP.  A note was written also for him to remain out of work for 2 days.   ____________________________________________   FINAL CLINICAL IMPRESSION(S) / ED DIAGNOSES  Final diagnoses:  Lumbar strain, initial encounter     ED  Discharge Orders          Ordered    predniSONE (DELTASONE) 10 MG tablet        01/31/21 1401    methocarbamol (ROBAXIN) 500 MG tablet  Every 6 hours PRN        01/31/21 1401    HYDROcodone-acetaminophen (NORCO/VICODIN) 5-325 MG tablet  Every 6 hours PRN        01/31/21 1401             Note:  This document was prepared using Dragon voice recognition software and may include unintentional dictation errors.    Johnn Hai, PA-C 01/31/21 1425    Vanessa Cedar, MD 01/31/21 [phone]
